# Patient Record
Sex: Male | Born: 1985
Health system: Southern US, Community
[De-identification: ages and names within clinical notes are randomized; demographics above are authoritative.]

## PROBLEM LIST (undated history)

## (undated) DIAGNOSIS — S52023A Displaced fracture of olecranon process without intraarticular extension of unspecified ulna, initial encounter for closed fracture: Secondary | ICD-10-CM

## (undated) DIAGNOSIS — F32A Depression, unspecified: Secondary | ICD-10-CM

## (undated) DIAGNOSIS — E785 Hyperlipidemia, unspecified: Secondary | ICD-10-CM

## (undated) DIAGNOSIS — G4700 Insomnia, unspecified: Secondary | ICD-10-CM

## (undated) DIAGNOSIS — I10 Essential (primary) hypertension: Secondary | ICD-10-CM

## (undated) DIAGNOSIS — F419 Anxiety disorder, unspecified: Secondary | ICD-10-CM

## (undated) DIAGNOSIS — N200 Calculus of kidney: Secondary | ICD-10-CM

## (undated) HISTORY — DX: Essential (primary) hypertension: I10

## (undated) HISTORY — PX: TONSILLECTOMY: SUR1361

## (undated) HISTORY — DX: Hyperlipidemia, unspecified: E78.5

## (undated) HISTORY — DX: Anxiety disorder, unspecified: F41.9

## (undated) HISTORY — PX: VASECTOMY: SHX75

## (undated) HISTORY — PX: FRACTURE SURGERY: SHX138

## (undated) HISTORY — DX: Depression, unspecified: F32.A

## (undated) HISTORY — DX: Insomnia, unspecified: G47.00

## (undated) HISTORY — DX: Calculus of kidney: N20.0

---
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
## 2006-09-29 ENCOUNTER — Emergency Department (HOSPITAL_COMMUNITY): Admission: EM | Admit: 2006-09-29 | Discharge: 2006-09-29 | Payer: Self-pay | Admitting: Emergency Medicine

## 2012-10-10 ENCOUNTER — Telehealth: Payer: Self-pay | Admitting: Physician Assistant

## 2012-10-11 MED ORDER — AMLODIPINE BESYLATE 5 MG PO TABS: 5.0000 mg | ORAL_TABLET | Freq: Every day | ORAL | Status: DC

## 2012-10-11 NOTE — Telephone Encounter (Signed)
done

## 2012-10-17 ENCOUNTER — Ambulatory Visit: Payer: Self-pay | Admitting: Physician Assistant

## 2012-10-17 ENCOUNTER — Encounter: Payer: Self-pay | Admitting: Physician Assistant

## 2012-10-17 VITALS — BP 146/79 | HR 68 | Temp 97.4°F | Ht 69.0 in | Wt 179.0 lb

## 2012-10-17 DIAGNOSIS — I10 Essential (primary) hypertension: Secondary | ICD-10-CM

## 2012-10-17 MED ORDER — AMLODIPINE BESYLATE 5 MG PO TABS: 5.0000 mg | ORAL_TABLET | Freq: Every day | ORAL | Status: DC

## 2012-10-17 MED ORDER — HYDROCHLOROTHIAZIDE 12.5 MG PO CAPS: 12.5000 mg | ORAL_CAPSULE | Freq: Every day | ORAL | Status: DC

## 2012-10-17 NOTE — Patient Instructions (Signed)

## 2012-10-17 NOTE — Progress Notes (Signed)
Subjective:     Patient ID: Jacob Benson, male   DOB: 18-Mar-1986, 27 y.o.   MRN: [id number]  HPI Pt here for review of his HTN He has a strong FH of HTN at young age All of his labs/EKG have returned nl He denies any CP/SOB He remains very active  Review of Systems  All other systems reviewed and are negative.       Objective:   Physical Exam VSS and nursing note reviewed No bruits Heart- RRR w/o M Lungs- CTA Pulses equal in upper ext No lower ext edema Discussed possible renal artery [place] but pt defers due to lack of ins    Assessment:     1. HTN (hypertension)        Plan:     Cont Norvasc Add HCTZ 12.5mg  daily Labs are up to date Meds x 6mos Pt will inform us when he has ins so we can set up renal artery [place]

## 2013-04-24 ENCOUNTER — Other Ambulatory Visit: Payer: Self-pay | Admitting: Physician Assistant

## 2013-04-26 ENCOUNTER — Telehealth: Payer: Self-pay | Admitting: Nurse Practitioner

## 2013-04-27 MED ORDER — HYDROCHLOROTHIAZIDE 12.5 MG PO CAPS: 12.5000 mg | ORAL_CAPSULE | Freq: Every day | ORAL | Status: DC

## 2013-04-27 NOTE — Telephone Encounter (Signed)
done

## 2013-04-27 NOTE — Telephone Encounter (Signed)
Last seen 10/17/12  [facility]

## 2013-05-21 ENCOUNTER — Encounter: Payer: Self-pay | Admitting: Family Medicine

## 2013-05-21 ENCOUNTER — Ambulatory Visit (INDEPENDENT_AMBULATORY_CARE_PROVIDER_SITE_OTHER): Payer: 59 | Admitting: Family Medicine

## 2013-05-21 VITALS — BP 127/74 | HR 69 | Temp 97.0°F | Ht 69.0 in | Wt 182.4 lb

## 2013-05-21 DIAGNOSIS — I10 Essential (primary) hypertension: Secondary | ICD-10-CM

## 2013-05-21 DIAGNOSIS — J069 Acute upper respiratory infection, unspecified: Secondary | ICD-10-CM

## 2013-05-21 MED ORDER — HYDROCHLOROTHIAZIDE 12.5 MG PO CAPS: 12.5000 mg | ORAL_CAPSULE | Freq: Every day | ORAL | Status: DC

## 2013-05-21 MED ORDER — AZITHROMYCIN 250 MG PO TABS: ORAL_TABLET | ORAL | Status: DC

## 2013-05-21 MED ORDER — AMLODIPINE BESYLATE 5 MG PO TABS: 5.0000 mg | ORAL_TABLET | Freq: Every day | ORAL | Status: DC

## 2013-05-21 NOTE — Progress Notes (Signed)
   Subjective:    Patient ID: Vladimir FasterBrandon M Mayden, male    DOB: [birth year], 28 y.o.   MRN: [id number]  HPI This 28 y.o. male presents for evaluation of hypertension.  He needs refills.  He has URI sx's for over a week.   Review of Systems C/o uri sx's No chest pain, SOB, HA, dizziness, vision change, N/V, diarrhea, constipation, dysuria, urinary urgency or frequency, myalgias, arthralgias or rash.     Objective:   Physical Exam  Vital signs noted  Well developed well nourished male.  HEENT - Head atraumatic Normocephalic                Eyes - PERRLA, Conjuctiva - clear Sclera- Clear EOMI                Ears - EAC's Wnl TM's Wnl Gross Hearing WNL                Nose - Nares patent                 Throat - oropharanx wnl Respiratory - Lungs CTA bilateral Cardiac - RRR S1 and S2 without murmur GI - Abdomen soft Nontender and bowel sounds active x 4 Extremities - No edema. Neuro - Grossly intact.      Assessment & Plan:  HTN (hypertension) - Plan: hydrochlorothiazide (MICROZIDE) 12.5 MG capsule, amLODipine (NORVASC) 5 MG tablet  URI (upper respiratory infection) - Plan: azithromycin (ZITHROMAX) 250 MG tablet Push po fluids, rest, tylenol and motrin otc prn as directed for fever, arthralgias, and myalgias.  Follow up prn if sx's continue or persist.  Deatra CanterWilliam J Yong Grieser FNP

## 2013-06-07 ENCOUNTER — Encounter: Payer: Self-pay | Admitting: Nurse Practitioner

## 2013-06-07 ENCOUNTER — Ambulatory Visit (INDEPENDENT_AMBULATORY_CARE_PROVIDER_SITE_OTHER): Payer: 59 | Admitting: Nurse Practitioner

## 2013-06-07 VITALS — BP 137/81 | HR 90 | Temp 98.1°F | Ht 70.0 in | Wt 178.0 lb

## 2013-06-07 DIAGNOSIS — J029 Acute pharyngitis, unspecified: Secondary | ICD-10-CM

## 2013-06-07 DIAGNOSIS — Z20828 Contact with and (suspected) exposure to other viral communicable diseases: Secondary | ICD-10-CM

## 2013-06-07 LAB — POCT INFLUENZA A/B
INFLUENZA A, POC: NEGATIVE
INFLUENZA B, POC: NEGATIVE

## 2013-06-07 LAB — POCT RAPID STREP A (OFFICE): RAPID STREP A SCREEN: NEGATIVE

## 2013-06-07 MED ORDER — OSELTAMIVIR PHOSPHATE 75 MG PO CAPS: 75.0000 mg | ORAL_CAPSULE | Freq: Every day | ORAL | Status: DC

## 2013-06-07 NOTE — Patient Instructions (Signed)

## 2013-06-07 NOTE — Progress Notes (Signed)
   Subjective:    Patient ID: Jacob Benson, male    DOB: 1985-07-15, 28 y.o.   MRN: [id number]  HPI Patient in today c/o sorethroat- started 2 days ago. Worse today.    Review of Systems  Constitutional: Positive for fatigue. Negative for fever, chills and appetite change.  HENT: Positive for congestion, postnasal drip, rhinorrhea, sinus pressure and sore throat. Negative for ear pain, trouble swallowing and voice change.   Respiratory: Positive for cough.   Cardiovascular: Negative.   Gastrointestinal: Negative.   All other systems reviewed and are negative.       Objective:   Physical Exam  Constitutional: He is oriented to person, place, and time. He appears well-developed and well-nourished. No distress.  HENT:  Right Ear: Hearing, tympanic membrane, external ear and ear canal normal.  Left Ear: Hearing, tympanic membrane, external ear and ear canal normal.  Nose: Mucosal edema and rhinorrhea present. Right sinus exhibits no maxillary sinus tenderness and no frontal sinus tenderness. Left sinus exhibits no maxillary sinus tenderness and no frontal sinus tenderness.  Mouth/Throat: Uvula is midline and oropharynx is clear and moist.  Cardiovascular: Normal rate, regular rhythm and normal heart sounds.   Pulmonary/Chest: Effort normal and breath sounds normal. No respiratory distress. He has no wheezes. He has no rales.  Dry cough   Neurological: He is alert and oriented to person, place, and time.  Skin: Skin is warm.  Psychiatric: He has a normal mood and affect. His behavior is normal. Judgment and thought content normal.    BP 137/81  Pulse 90  Temp(Src) 98.1 F (36.7 C) (Oral)  Ht 5\' 10"  (1.778 m)  Wt 178 lb (80.74 kg)  BMI 25.54 kg/m2 Results for orders placed in visit on 06/07/13  POCT RAPID STREP A (OFFICE)      Result Value Range   Rapid Strep A Screen Negative  Negative  POCT INFLUENZA A/B      Result Value Range   Influenza A, POC Negative     Influenza B, POC Negative           Assessment & Plan:   1. Sore throat   2. Exposure to the flu    Meds ordered this encounter  Medications  . oseltamivir (TAMIFLU) 75 MG capsule    Sig: Take 1 capsule (75 mg total) by mouth daily.    Dispense:  10 capsule    Refill:  0    Order Specific Question:  Supervising Provider    Answer:  Ernestina PennaMOORE, DONALD W [[id number]]   1. Take meds as prescribed 2. Use a cool mist humidifier especially during the winter months and when heat has been humid. 3. Use saline nose sprays frequently 4. Saline irrigations of the nose can be very helpful if done frequently.  * 4X daily for 1 week*  * Use of a nettie pot can be helpful with this. Follow directions with this* 5. Drink plenty of fluids 6. Keep thermostat turn down low 7.For any cough or congestion  Use plain Mucinex- regular strength or max strength is fine   * Children- consult with Pharmacist for dosing 8. For fever or aces or pains- take tylenol or ibuprofen appropriate for age and weight.  * for fevers greater than 101 orally you may alternate ibuprofen and tylenol every  3 hours.   Mary-Margaret Daphine DeutscherMartin, FNP

## 2013-12-10 ENCOUNTER — Encounter: Payer: Self-pay | Admitting: Family Medicine

## 2013-12-10 ENCOUNTER — Ambulatory Visit (INDEPENDENT_AMBULATORY_CARE_PROVIDER_SITE_OTHER): Payer: 59

## 2013-12-10 ENCOUNTER — Telehealth: Payer: Self-pay | Admitting: Family Medicine

## 2013-12-10 ENCOUNTER — Encounter (INDEPENDENT_AMBULATORY_CARE_PROVIDER_SITE_OTHER): Payer: Self-pay

## 2013-12-10 ENCOUNTER — Ambulatory Visit (INDEPENDENT_AMBULATORY_CARE_PROVIDER_SITE_OTHER): Payer: 59 | Admitting: Family Medicine

## 2013-12-10 VITALS — BP 125/79 | HR 70 | Temp 98.2°F | Ht 70.0 in | Wt 180.2 lb

## 2013-12-10 DIAGNOSIS — M79601 Pain in right arm: Secondary | ICD-10-CM

## 2013-12-10 DIAGNOSIS — M79609 Pain in unspecified limb: Secondary | ICD-10-CM

## 2013-12-10 MED ORDER — IBUPROFEN 600 MG PO TABS: 600.0000 mg | ORAL_TABLET | Freq: Three times a day (TID) | ORAL | Status: DC | PRN

## 2013-12-10 NOTE — Progress Notes (Signed)
   Subjective:    Patient ID: Vladimir FasterBrandon M Antuna, male    DOB: [birth year], 28 y.o.   MRN: [id number]  HPI This 28 y.o. male presents for evaluation of right arm pain.  He had heavy object fall on his right arm and it hurts to lift.  He has full ROM of right hand.   Review of Systems No chest pain, SOB, HA, dizziness, vision change, N/V, diarrhea, constipation, dysuria, urinary urgency or frequency, myalgias, arthralgias or rash.     Objective:   Physical Exam  Vital signs noted  Well developed well nourished male.  HEENT - Head atraumatic Normocephalic                Eyes - PERRLA, Conjuctiva - clear Sclera- Clear EOMI                Ears - EAC's Wnl TM's Wnl Gross Hearing WNL                Throat - oropharanx wnl Respiratory - Lungs CTA bilateral Cardiac - RRR S1 and S2 without murmur. MS - TTP right forearm.  No deformity.  Xray of right forearm - No fracture Prelimnary reading by Angeline SlimWilliam Chevella Pearce,FNP      Assessment & Plan:  Right arm pain - Plan: DG Forearm Right, ibuprofen (ADVIL,MOTRIN) 600 MG tablet po tid x 10 days ICE to sight and recommend tylenol for pain  Deatra CanterWilliam J Marka Treloar FNP

## 2013-12-10 NOTE — Telephone Encounter (Signed)
Appt given for today 

## 2014-01-28 ENCOUNTER — Telehealth: Payer: Self-pay | Admitting: Family Medicine

## 2014-01-29 MED ORDER — HYDROCHLOROTHIAZIDE 12.5 MG PO CAPS: 12.5000 mg | ORAL_CAPSULE | Freq: Every day | ORAL | Status: DC

## 2014-01-29 MED ORDER — AMLODIPINE BESYLATE 5 MG PO TABS: 5.0000 mg | ORAL_TABLET | Freq: Every day | ORAL | Status: DC

## 2014-01-29 NOTE — Telephone Encounter (Signed)
done

## 2014-03-18 ENCOUNTER — Ambulatory Visit (INDEPENDENT_AMBULATORY_CARE_PROVIDER_SITE_OTHER): Payer: 59 | Admitting: Family Medicine

## 2014-03-18 ENCOUNTER — Encounter: Payer: Self-pay | Admitting: Family Medicine

## 2014-03-18 VITALS — BP 122/80 | HR 66 | Temp 99.3°F | Ht 70.0 in | Wt 183.0 lb

## 2014-03-18 DIAGNOSIS — Z23 Encounter for immunization: Secondary | ICD-10-CM

## 2014-03-18 DIAGNOSIS — F411 Generalized anxiety disorder: Secondary | ICD-10-CM

## 2014-03-18 DIAGNOSIS — K589 Irritable bowel syndrome without diarrhea: Secondary | ICD-10-CM

## 2014-03-18 DIAGNOSIS — I10 Essential (primary) hypertension: Secondary | ICD-10-CM

## 2014-03-18 MED ORDER — HYDROCHLOROTHIAZIDE 12.5 MG PO CAPS: 12.5000 mg | ORAL_CAPSULE | Freq: Every day | ORAL | Status: DC

## 2014-03-18 MED ORDER — AMLODIPINE BESYLATE 5 MG PO TABS: 5.0000 mg | ORAL_TABLET | Freq: Every day | ORAL | Status: DC

## 2014-03-18 NOTE — Patient Instructions (Signed)
Use Citrucel low sugar over-the-counter for constipation/diarrhea Continue to drink plenty of fluids Avoid caffeine milk cheese ice cream and dairy products Return to clinic as directed for fasting lab work and urinalysis Continue to take current medication Take Zantac or ranitidine 150 over-the-counter one twice daily before breakfast and supper Avoid NSAIDs like ibuprofen and Aleve as much as possible

## 2014-03-18 NOTE — Progress Notes (Signed)
Subjective:    Patient ID: Jacob Benson, male    DOB: [birth year], 28 y.o.   MRN: [id number]  HPI Patient here today for follow up on HTN. He has recently changed insurances and therefore pharmacies.the patient indicates that he has had trouble with his stomach for years. He has alternating diarrhea and constipation. He says he does not drink a lot of caffeine or milk cheese ice cream and dairy products. He currently appears very anxious and is concerned about his wife who is due in February with her first child.     Patient Active Problem List   Diagnosis Date Noted  . HTN (hypertension) 10/17/2012   Outpatient Encounter Prescriptions as of 03/18/2014  Medication Sig  . amLODipine (NORVASC) 5 MG tablet Take 1 tablet (5 mg total) by mouth daily.  . hydrochlorothiazide (MICROZIDE) 12.5 MG capsule Take 1 capsule (12.5 mg total) by mouth daily.  Marland Kitchen. ibuprofen (ADVIL,MOTRIN) 600 MG tablet Take 1 tablet (600 mg total) by mouth every 8 (eight) hours as needed.  . [DISCONTINUED] amLODipine (NORVASC) 5 MG tablet Take 1 tablet (5 mg total) by mouth daily.  . [DISCONTINUED] hydrochlorothiazide (MICROZIDE) 12.5 MG capsule Take 1 capsule (12.5 mg total) by mouth daily.     Review of Systems  Constitutional: Negative.   HENT: Negative.   Eyes: Negative.   Respiratory: Negative.   Cardiovascular: Negative.   Gastrointestinal: Positive for constipation.  Endocrine: Negative.   Genitourinary: Negative.   Musculoskeletal: Negative.   Skin: Negative.   Allergic/Immunologic: Negative.   Neurological: Negative.   Hematological: Negative.   Psychiatric/Behavioral: Negative.        Objective:   Physical Exam  Constitutional: He is oriented to person, place, and time. He appears well-developed and well-nourished. No distress.  HENT:  Head: Normocephalic and atraumatic.  Right Ear: External ear normal.  Left Ear: External ear normal.  Nose: Nose normal.  Mouth/Throat: Oropharynx is clear  and moist. No oropharyngeal exudate.  Eyes: Conjunctivae and EOM are normal. Pupils are equal, round, and reactive to light. Right eye exhibits no discharge. Left eye exhibits no discharge. No scleral icterus.  Neck: Normal range of motion. Neck supple. No thyromegaly present.  Cardiovascular: Normal rate, regular rhythm, normal heart sounds and intact distal pulses.  Exam reveals no gallop and no friction rub.   No murmur heard. At 72/m  Pulmonary/Chest: Effort normal and breath sounds normal. No respiratory distress. He has no wheezes. He has no rales. He exhibits no tenderness.  There are no axillary nodes.  Abdominal: Soft. Bowel sounds are normal. He exhibits no mass. There is no tenderness. There is no rebound and no guarding.  Bowel sounds are normal without abdominal tenderness or inguinal nodes.  Musculoskeletal: Normal range of motion. He exhibits no edema or tenderness.  Lymphadenopathy:    He has no cervical adenopathy.  Neurological: He is alert and oriented to person, place, and time. He has normal reflexes. No cranial nerve deficit.  Skin: Skin is warm and dry. No rash noted. No erythema. No pallor.  Psychiatric: He has a normal mood and affect. His behavior is normal. Judgment and thought content normal.  Nursing note and vitals reviewed.  BP 131/77 mmHg  Pulse 66  Temp(Src) 99.3 F (37.4 C) (Oral)  Ht 5\' 10"  (1.778 m)  Wt 183 lb (83.008 kg)  BMI 26.26 kg/m2  Home blood pressures are running in the 120s over the upper 70s.      Assessment & Plan:  1. Essential hypertension - amLODipine (NORVASC) 5 MG tablet; Take 1 tablet (5 mg total) by mouth daily.  Dispense: 90 tablet; Refill: 3 - hydrochlorothiazide (MICROZIDE) 12.5 MG capsule; Take 1 capsule (12.5 mg total) by mouth daily.  Dispense: 90 capsule; Refill: 3  2. Generalized anxiety disorder  3. Irritable bowel syndrome  Meds ordered this encounter  Medications  . amLODipine (NORVASC) 5 MG tablet    Sig:  Take 1 tablet (5 mg total) by mouth daily.    Dispense:  90 tablet    Refill:  3  . hydrochlorothiazide (MICROZIDE) 12.5 MG capsule    Sig: Take 1 capsule (12.5 mg total) by mouth daily.    Dispense:  90 capsule    Refill:  3   Patient Instructions  Use Citrucel low sugar over-the-counter for constipation/diarrhea Continue to drink plenty of fluids Avoid caffeine milk cheese ice cream and dairy products Return to clinic as directed for fasting lab work and urinalysis Continue to take current medication Take Zantac or ranitidine 150 over-the-counter one twice daily before breakfast and supper Avoid NSAIDs like ibuprofen and Aleve as much as possible   Nyra Capeson W. Reesa Gotschall MD

## 2014-03-22 ENCOUNTER — Other Ambulatory Visit (INDEPENDENT_AMBULATORY_CARE_PROVIDER_SITE_OTHER): Payer: 59

## 2014-03-22 DIAGNOSIS — Z1212 Encounter for screening for malignant neoplasm of rectum: Secondary | ICD-10-CM

## 2014-03-22 DIAGNOSIS — K589 Irritable bowel syndrome without diarrhea: Secondary | ICD-10-CM

## 2014-03-22 DIAGNOSIS — F411 Generalized anxiety disorder: Secondary | ICD-10-CM

## 2014-03-22 DIAGNOSIS — I10 Essential (primary) hypertension: Secondary | ICD-10-CM

## 2014-03-22 LAB — POCT CBC
GRANULOCYTE PERCENT: 69.4 % (ref 37–80)
HEMATOCRIT: 45.7 % (ref 43.5–53.7)
Hemoglobin: 15.3 g/dL (ref 14.1–18.1)
Lymph, poc: 1.7 (ref 0.6–3.4)
MCH, POC: 29.2 pg (ref 27–31.2)
MCHC: 33.6 g/dL (ref 31.8–35.4)
MCV: 86.9 fL (ref 80–97)
MPV: 7.4 fL (ref 0–99.8)
POC Granulocyte: 4.5 (ref 2–6.9)
POC LYMPH PERCENT: 26.9 %L (ref 10–50)
Platelet Count, POC: 234 10*3/uL (ref 142–424)
RBC: 5.3 M/uL (ref 4.69–6.13)
RDW, POC: 13.7 %
WBC: 6.5 10*3/uL (ref 4.6–10.2)

## 2014-03-22 NOTE — Addendum Note (Signed)
Addended by: Prescott GumLAND, Yicel Shannon M on: 03/22/2014 08:05 AM   Modules accepted: Orders

## 2014-03-22 NOTE — Progress Notes (Signed)
Lab only 

## 2014-03-23 LAB — BMP8+EGFR
BUN/Creatinine Ratio: 14 (ref 8–19)
BUN: 18 mg/dL (ref 6–20)
CO2: 27 mmol/L (ref 18–29)
Calcium: 9.2 mg/dL (ref 8.7–10.2)
Chloride: 98 mmol/L (ref 97–108)
Creatinine, Ser: 1.32 mg/dL — ABNORMAL HIGH (ref 0.76–1.27)
GFR calc Af Amer: 85 mL/min/{1.73_m2} (ref >59)
GFR calc non Af Amer: 73 mL/min/{1.73_m2} (ref >59)
Glucose: 88 mg/dL (ref 65–99)
Potassium: 4 mmol/L (ref 3.5–5.2)
Sodium: 138 mmol/L (ref 134–144)

## 2014-03-23 LAB — NMR, LIPOPROFILE
Cholesterol: 231 mg/dL — ABNORMAL HIGH (ref 100–199)
HDL CHOLESTEROL BY NMR: 43 mg/dL (ref >39)
HDL Particle Number: 29.8 umol/L — ABNORMAL LOW (ref >=30.5)
LDL Particle Number: 1934 nmol/L — ABNORMAL HIGH (ref <1000)
LDL Size: 21.5 nm (ref >20.5)
LDL-C: 170 mg/dL — ABNORMAL HIGH (ref 0–99)
LP-IR Score: 45 (ref <=45)
Small LDL Particle Number: 618 nmol/L — ABNORMAL HIGH (ref <=527)
TRIGLYCERIDES BY NMR: 89 mg/dL (ref 0–149)

## 2014-03-23 LAB — HEPATIC FUNCTION PANEL
ALBUMIN: 4.3 g/dL (ref 3.5–5.5)
ALK PHOS: 66 IU/L (ref 39–117)
ALT: 19 IU/L (ref 0–44)
AST: 18 IU/L (ref 0–40)
Bilirubin, Direct: 0.14 mg/dL (ref 0.00–0.40)
Total Bilirubin: 0.6 mg/dL (ref 0.0–1.2)
Total Protein: 6.7 g/dL (ref 6.0–8.5)

## 2014-03-23 LAB — FECAL OCCULT BLOOD, IMMUNOCHEMICAL: FECAL OCCULT BLD: NEGATIVE

## 2014-03-23 LAB — THYROID PANEL WITH TSH
FREE THYROXINE INDEX: 2.4 (ref 1.2–4.9)
T3 UPTAKE RATIO: 30 % (ref 24–39)
T4 TOTAL: 8.1 ug/dL (ref 4.5–12.0)
TSH: 2.8 u[IU]/mL (ref 0.450–4.500)

## 2014-04-19 ENCOUNTER — Encounter: Payer: Self-pay | Admitting: Pharmacist

## 2014-04-19 ENCOUNTER — Ambulatory Visit (INDEPENDENT_AMBULATORY_CARE_PROVIDER_SITE_OTHER): Payer: 59 | Admitting: Pharmacist

## 2014-04-19 VITALS — BP 124/80 | HR 70 | Ht 70.0 in | Wt 185.0 lb

## 2014-04-19 DIAGNOSIS — R7989 Other specified abnormal findings of blood chemistry: Secondary | ICD-10-CM

## 2014-04-19 DIAGNOSIS — E785 Hyperlipidemia, unspecified: Secondary | ICD-10-CM

## 2014-04-19 DIAGNOSIS — I10 Essential (primary) hypertension: Secondary | ICD-10-CM

## 2014-04-19 DIAGNOSIS — R748 Abnormal levels of other serum enzymes: Secondary | ICD-10-CM

## 2014-04-19 LAB — POCT URINALYSIS DIPSTICK
Bilirubin, UA: NEGATIVE
Blood, UA: NEGATIVE
Glucose, UA: NEGATIVE
Ketones, UA: NEGATIVE
LEUKOCYTES UA: NEGATIVE
NITRITE UA: NEGATIVE
PH UA: 7
Protein, UA: NEGATIVE
Spec Grav, UA: 1.01
Urobilinogen, UA: NEGATIVE

## 2014-04-19 NOTE — Progress Notes (Signed)
Lipid Clinic Consultation  Chief Complaint:   Chief Complaint  Patient presents with  . Hyperlipidemia     HPI:  Jacob Benson is a 28yo male who presents for dietary consult for hyperlipidemia.  This is the patient's first visit with the clinical pharmacist to discuss hyperlipidemia.  Patient has significant family history of heart disease of second degree relatives but not first degree.  He has been taking medication to control HTN for the 1-2 years.  Patient voices concern about his young age and HTN.  In past he saw a provider who was considering an ultrasound to r/o RAS but this was never done.  He recently had an elevated serum creatinine - due to recheck today.  Also has h/o kidney stones in 2007  CHD/CHF Risk Equivalents:  none AHA ASCVD risk assessment - unable to determine due to age < 40.   Current NCEP Goals: LDL Goal < 130 HDL Goal >/= 40 Tg Goal < 150 Non-HDL Goal < 160  Secondary cause of hyperlipidemia present:  none Low fat diet followed?  No - eats out 2 meals per day.  Usually has a big breakfast with sausage or bacon Low carb diet followed?  No -   Exercise?  No - but has physical job.  Filed Vitals:   04/19/14 1550  BP: 124/80  Pulse: 70   Filed Weights   04/19/14 1550  Weight: 185 lb (83.915 kg)  Body mass index is 26.54 kg/(m^2).     Component Value Date/Time   CHOL 231 03/22/2014   LDL - C 170 03/22/2014   LDL-P 1934 03/22/2014   HDL 43 03/22/2014   TRIG 89 03/22/2014   Serum Creatinine was 1.32 (03/18/2014)  Assessment: Hyperlipidemia HTN - controlled Elevated serum creatinine  Recommendations: Discussed limiting high fat containing foods - increase non starchy vegetables. Discussed Mediterranean Diet Start regular exercise - 30 minutes daily 5 days per week Continue current BP medications Discussed elevated serum creatinine with Dr Moore - labs recommended below are pending Depending on results might consider renal U/S to r/o RAS, CT of  abdomen to r/o structural abnormalities or kidney stones and referral to nephrologist.  Orders Placed This Encounter  Procedures  . BMP8+EGFR  . CBC With differential/Platelet  . POCT urinalysis dipstick     Recheck Lipid Panel:  3 to 4 months   Time spent counseling patient:  40 minutes   Referring Provider:  Moore, Donald MD     PharmD:  , , PHARMD , CPP     

## 2014-04-20 LAB — BMP8+EGFR
BUN/Creatinine Ratio: 11 (ref 8–19)
BUN: 16 mg/dL (ref 6–20)
CALCIUM: 10 mg/dL (ref 8.7–10.2)
CHLORIDE: 98 mmol/L (ref 97–108)
CO2: 28 mmol/L (ref 18–29)
Creatinine, Ser: 1.4 mg/dL — ABNORMAL HIGH (ref 0.76–1.27)
GFR calc Af Amer: 79 mL/min/{1.73_m2} (ref >59)
GFR calc non Af Amer: 68 mL/min/{1.73_m2} (ref >59)
Glucose: 69 mg/dL (ref 65–99)
POTASSIUM: 4.2 mmol/L (ref 3.5–5.2)
Sodium: 142 mmol/L (ref 134–144)

## 2014-04-20 LAB — CBC WITH DIFFERENTIAL
Basophils Absolute: 0 10*3/uL (ref 0.0–0.2)
Basos: 0 %
EOS ABS: 0.1 10*3/uL (ref 0.0–0.4)
Eos: 1 %
HCT: 44.9 % (ref 37.5–51.0)
Hemoglobin: 15.6 g/dL (ref 12.6–17.7)
IMMATURE GRANULOCYTES: 0 %
Immature Grans (Abs): 0 10*3/uL (ref 0.0–0.1)
LYMPHS: 23 %
Lymphocytes Absolute: 2.6 10*3/uL (ref 0.7–3.1)
MCH: 29.7 pg (ref 26.6–33.0)
MCHC: 34.7 g/dL (ref 31.5–35.7)
MCV: 85 fL (ref 79–97)
MONOS ABS: 0.7 10*3/uL (ref 0.1–0.9)
Monocytes: 6 %
NEUTROS ABS: 7.8 10*3/uL — AB (ref 1.4–7.0)
Neutrophils Relative %: 70 %
Platelets: 250 10*3/uL (ref 150–379)
RBC: 5.26 x10E6/uL (ref 4.14–5.80)
RDW: 13.9 % (ref 12.3–15.4)
WBC: 11.3 10*3/uL — ABNORMAL HIGH (ref 3.4–10.8)

## 2014-04-22 ENCOUNTER — Telehealth: Payer: Self-pay | Admitting: Family Medicine

## 2014-04-22 ENCOUNTER — Other Ambulatory Visit: Payer: Self-pay

## 2014-04-22 DIAGNOSIS — R7989 Other specified abnormal findings of blood chemistry: Secondary | ICD-10-CM

## 2014-04-22 NOTE — Telephone Encounter (Signed)
Patient has a few questions about labs and CT that was ordered.  Questions answered.

## 2014-04-26 ENCOUNTER — Ambulatory Visit (HOSPITAL_COMMUNITY)
Admission: RE | Admit: 2014-04-26 | Discharge: 2014-04-26 | Disposition: A | Discharge status: Home / Self Care | Payer: 59 | Source: Ambulatory Visit | Attending: Family Medicine | Admitting: Family Medicine

## 2014-04-26 DIAGNOSIS — N281 Cyst of kidney, acquired: Secondary | ICD-10-CM | POA: Insufficient documentation

## 2014-04-26 DIAGNOSIS — R748 Abnormal levels of other serum enzymes: Secondary | ICD-10-CM | POA: Insufficient documentation

## 2014-04-26 DIAGNOSIS — R7989 Other specified abnormal findings of blood chemistry: Secondary | ICD-10-CM

## 2014-04-26 MED ORDER — IOHEXOL 300 MG/ML  SOLN
100.0000 mL | Freq: Once | INTRAMUSCULAR | Status: AC | PRN
  Administered 2014-04-26: 100 mL via INTRAVENOUS

## 2014-04-29 ENCOUNTER — Ambulatory Visit: Payer: 59 | Admitting: Family Medicine

## 2014-05-01 ENCOUNTER — Ambulatory Visit (INDEPENDENT_AMBULATORY_CARE_PROVIDER_SITE_OTHER): Payer: 59 | Admitting: Family Medicine

## 2014-05-01 ENCOUNTER — Encounter: Payer: Self-pay | Admitting: Family Medicine

## 2014-05-01 VITALS — BP 137/80 | HR 70 | Temp 97.3°F | Ht 70.0 in | Wt 184.0 lb

## 2014-05-01 DIAGNOSIS — I10 Essential (primary) hypertension: Secondary | ICD-10-CM

## 2014-05-01 DIAGNOSIS — R748 Abnormal levels of other serum enzymes: Secondary | ICD-10-CM

## 2014-05-01 DIAGNOSIS — R7989 Other specified abnormal findings of blood chemistry: Secondary | ICD-10-CM

## 2014-05-01 NOTE — Progress Notes (Signed)
   Subjective:    Patient ID: Jacob Benson, male    DOB: Jan 20, 1986, 28 y.o.   MRN: [id number]  HPI Patient here today for 6 week follow up on HTN and to go over recent CT scan. This was reviewed with the patient he was given a copy of this report. The patient indicates that he could do better with his sodium intake and we'll try to do better with his calorie intake. He is currently taking HCTZ 12.5 one daily and amlodipine 5 mg 1 daily.         Patient Active Problem List   Diagnosis Date Noted  . HTN (hypertension) 10/17/2012   Outpatient Encounter Prescriptions as of 05/01/2014  Medication Sig  . amLODipine (NORVASC) 5 MG tablet Take 1 tablet (5 mg total) by mouth daily.  . hydrochlorothiazide (MICROZIDE) 12.5 MG capsule Take 1 capsule (12.5 mg total) by mouth daily.    Review of Systems  Constitutional: Negative.   HENT: Negative.   Eyes: Negative.   Respiratory: Negative.   Cardiovascular: Negative.   Gastrointestinal: Negative.   Endocrine: Negative.   Genitourinary: Negative.   Musculoskeletal: Negative.   Skin: Negative.   Allergic/Immunologic: Negative.   Neurological: Negative.   Hematological: Negative.   Psychiatric/Behavioral: Negative.        Objective:   Physical Exam  Constitutional: He is oriented to person, place, and time. He appears well-developed and well-nourished. No distress.  HENT:  Head: Normocephalic and atraumatic.  Left Ear: External ear normal.  Eyes: Conjunctivae and EOM are normal. Pupils are equal, round, and reactive to light. Right eye exhibits no discharge. Left eye exhibits no discharge. No scleral icterus.  Neck: Normal range of motion. Neck supple. No thyromegaly present.  Cardiovascular: Normal rate, regular rhythm and normal heart sounds.   No murmur heard. At 60/m  Pulmonary/Chest: Effort normal and breath sounds normal. No respiratory distress. He has no wheezes. He has no rales. He exhibits no tenderness.  Abdominal:  Soft. Bowel sounds are normal. He exhibits no mass. There is no tenderness. There is no rebound and no guarding.  Musculoskeletal: Normal range of motion. He exhibits no edema.  Lymphadenopathy:    He has no cervical adenopathy.  Neurological: He is alert and oriented to person, place, and time.  Skin: Skin is warm and dry. No rash noted.  Psychiatric: He has a normal mood and affect. His behavior is normal. Judgment and thought content normal.  Nursing note and vitals reviewed.  BP 137/80 mmHg  Pulse 70  Temp(Src) 97.3 F (36.3 C) (Oral)  Ht _0  (1.778 m)  Wt 184 lb (83.462 kg)  BMI 26.40 kg/m2        Assessment & Plan:  1. Elevated serum creatinine - BMP8+EGFR  2. Essential hypertension  Patient Instructions  Continue to drink plenty of water Continue to monitor blood pressures and bring these readings by for review in 4-6 weeks Work on caloric intake by reducing carbs and getting more exercise Continue current medication We will try to get an appointment for you to see the nephrologist to further evaluate why your creatinine is elevated.   Arrie Senate MD

## 2014-05-01 NOTE — Addendum Note (Signed)
Addended by: Magdalene RiverBULLINS, JAMIE H on: 05/01/2014 09:34 AM   Modules accepted: Orders

## 2014-05-01 NOTE — Patient Instructions (Signed)
Continue to drink plenty of water Continue to monitor blood pressures and bring these readings by for review in 4-6 weeks Work on caloric intake by reducing carbs and getting more exercise Continue current medication We will try to get an appointment for you to see the nephrologist to further evaluate why your creatinine is elevated.

## 2014-05-02 ENCOUNTER — Ambulatory Visit: Payer: Self-pay | Admitting: Family Medicine

## 2014-05-02 LAB — BMP8+EGFR
BUN / CREAT RATIO: 12 (ref 8–19)
BUN: 15 mg/dL (ref 6–20)
CALCIUM: 9.6 mg/dL (ref 8.7–10.2)
CHLORIDE: 98 mmol/L (ref 97–108)
CO2: 26 mmol/L (ref 18–29)
CREATININE: 1.25 mg/dL (ref 0.76–1.27)
GFR calc Af Amer: 91 mL/min/{1.73_m2} (ref >59)
GFR calc non Af Amer: 78 mL/min/{1.73_m2} (ref >59)
Glucose: 86 mg/dL (ref 65–99)
Potassium: 4.1 mmol/L (ref 3.5–5.2)
SODIUM: 140 mmol/L (ref 134–144)

## 2014-06-06 ENCOUNTER — Encounter: Payer: Self-pay | Admitting: *Deleted

## 2014-06-28 ENCOUNTER — Telehealth: Payer: Self-pay | Admitting: Family Medicine

## 2014-06-28 DIAGNOSIS — I10 Essential (primary) hypertension: Secondary | ICD-10-CM

## 2014-06-28 MED ORDER — AMLODIPINE BESYLATE 5 MG PO TABS: 5.0000 mg | ORAL_TABLET | Freq: Every day | ORAL | Status: DC

## 2014-06-28 MED ORDER — HYDROCHLOROTHIAZIDE 12.5 MG PO CAPS: 12.5000 mg | ORAL_CAPSULE | Freq: Every day | ORAL | Status: DC

## 2014-06-28 NOTE — Telephone Encounter (Signed)
done

## 2014-08-06 ENCOUNTER — Encounter: Payer: Self-pay | Admitting: Family Medicine

## 2014-08-06 ENCOUNTER — Ambulatory Visit (INDEPENDENT_AMBULATORY_CARE_PROVIDER_SITE_OTHER): Payer: 59 | Admitting: Family Medicine

## 2014-08-06 VITALS — BP 129/71 | HR 76 | Temp 98.0°F | Ht 70.0 in | Wt 184.0 lb

## 2014-08-06 DIAGNOSIS — I10 Essential (primary) hypertension: Secondary | ICD-10-CM | POA: Diagnosis not present

## 2014-08-06 DIAGNOSIS — E785 Hyperlipidemia, unspecified: Secondary | ICD-10-CM

## 2014-08-06 DIAGNOSIS — J301 Allergic rhinitis due to pollen: Secondary | ICD-10-CM

## 2014-08-06 DIAGNOSIS — J019 Acute sinusitis, unspecified: Secondary | ICD-10-CM

## 2014-08-06 MED ORDER — AMOXICILLIN 500 MG PO CAPS: 500.0000 mg | ORAL_CAPSULE | Freq: Three times a day (TID) | ORAL | Status: DC

## 2014-08-06 MED ORDER — FLUTICASONE PROPIONATE 50 MCG/ACT NA SUSP: 2.0000 | Freq: Every day | NASAL | Status: DC

## 2014-08-06 NOTE — Progress Notes (Signed)
Subjective:    Patient ID: Jacob Benson, male    DOB: [birth year], 29 y.o.   MRN: [id number]  HPI Pt here for follow up and management of chronic medical problems which includes hypertension and hyperlipidemia. He is taking medications regularly. The patient is also complaining of ear pain today. He also has had nasal congestion. All of his recent lab work was reviewed with him today and he understands the progression of this. Back in November his cholesterol was elevated significantly with advanced lipid testing and he had a couple of elevations of his creatinine which eventually became normal. He says he is feeling well other than the ear pain he has not had a cold or cough recently.         Patient Active Problem List   Diagnosis Date Noted  . HTN (hypertension) 10/17/2012   Outpatient Encounter Prescriptions as of 08/06/2014  Medication Sig  . amLODipine (NORVASC) 5 MG tablet Take 1 tablet (5 mg total) by mouth daily.  . hydrochlorothiazide (MICROZIDE) 12.5 MG capsule Take 1 capsule (12.5 mg total) by mouth daily.    Review of Systems  Constitutional: Negative.   HENT: Positive for ear pain (bilateral ear pain).   Eyes: Negative.   Respiratory: Negative.   Cardiovascular: Negative.   Gastrointestinal: Negative.   Endocrine: Negative.   Genitourinary: Negative.   Musculoskeletal: Negative.   Skin: Negative.   Allergic/Immunologic: Negative.   Neurological: Negative.   Hematological: Negative.   Psychiatric/Behavioral: Negative.        Objective:   Physical Exam  Constitutional: He is oriented to person, place, and time. He appears well-developed and well-nourished. No distress.  HENT:  Head: Normocephalic and atraumatic.  Right Ear: External ear normal.  Left Ear: External ear normal.  Mouth/Throat: No oropharyngeal exudate.  The throat is red posteriorly and there is nasal turbinate congestion bilaterally right greater than left. Both TMs and ear canals were  clear. There is ethmoid sinus tenderness  Eyes: Conjunctivae and EOM are normal. Pupils are equal, round, and reactive to light. Right eye exhibits no discharge. Left eye exhibits no discharge. No scleral icterus.  Neck: Normal range of motion. Neck supple. No thyromegaly present.  Cardiovascular: Normal rate, regular rhythm and normal heart sounds.   No murmur heard. Pulmonary/Chest: Effort normal and breath sounds normal. No respiratory distress. He has no wheezes. He has no rales. He exhibits no tenderness.  Musculoskeletal: Normal range of motion.  Lymphadenopathy:    He has no cervical adenopathy.  Neurological: He is alert and oriented to person, place, and time.  Skin: Skin is warm and dry. No rash noted.  Psychiatric: He has a normal mood and affect. His behavior is normal. Judgment and thought content normal.  Nursing note and vitals reviewed.  BP 129/71 mmHg  Pulse 76  Temp(Src) 98 F (36.7 C) (Oral)  Ht _0  (1.778 m)  Wt 184 lb (83.462 kg)  BMI 26.40 kg/m2        Assessment & Plan:  1. Essential hypertension -The blood pressure is well controlled and the patient should continue with his current treatment and continue to watch sodium intake and keep his weight down. He should also drink plenty of water. - POCT CBC; Future - BMP8+EGFR; Future - Hepatic function panel; Future - NMR, lipoprofile; Future  2. Hyperlipidemia -The patient's previous cholesterol was significantly elevated. We will check another cholesterol and if it remains that elevated we may end up starting him on some medication  for this. The risk factors for heart disease were discussed with him today during the visit - POCT CBC; Future - BMP8+EGFR; Future - Hepatic function panel; Future - NMR, lipoprofile; Future  3. Allergic rhinitis due to pollen -This is been worse over the past couple of weeks. He is already taking Claritin.  4. Acute rhinosinusitis -Because of the ethmoid sinus tenderness  we will start him on an antibiotic.  Meds ordered this encounter  Medications  . amoxicillin (AMOXIL) 500 MG capsule    Sig: Take 1 capsule (500 mg total) by mouth 3 (three) times daily.    Dispense:  30 capsule    Refill:  0  . fluticasone (FLONASE) 50 MCG/ACT nasal spray    Sig: Place 2 sprays into both nostrils daily.    Dispense:  16 g    Refill:  6   Patient Instructions  Continue current medications. Continue good therapeutic lifestyle changes which include good diet and exercise. Fall precautions discussed with patient. If an FOBT was given today- please return it to our front desk.  Flu Shots are still available at our office. If you still haven't had one please call to set up a nurse visit to get one.   After your visit with [place] today you will receive a survey in the mail or online from [employer] regarding your care with [place]. Please take a moment to fill this out. Your feedback is very important to [place] as you can help [place] better understand your patient needs as well as improve your experience and satisfaction. WE CARE ABOUT YOU!!!   The patient should continue to take his blood pressure medicine as he is doing He should come to the office about every 6 months and get his cholesterol checked along with his kidney function. He should not restart smoking He should watch his sodium intake He is to come in on April 6 and get lab work done and we will see him back in 6 months--- we will call him with these results when they become available    Arrie Senate MD

## 2014-08-06 NOTE — Patient Instructions (Addendum)
Continue current medications. Continue good therapeutic lifestyle changes which include good diet and exercise. Fall precautions discussed with patient. If an FOBT was given today- please return it to our front desk.  Flu Shots are still available at our office. If you still haven't had one please call to set up a nurse visit to get one.   After your visit with us today you will receive a survey in the mail or online from [employer] regarding your care with us. Please take a moment to fill this out. Your feedback is very important to us as you can help us better understand your patient needs as well as improve your experience and satisfaction. WE CARE ABOUT YOU!!!   The patient should continue to take his blood pressure medicine as he is doing He should come to the office about every 6 months and get his cholesterol checked along with his kidney function. He should not restart smoking He should watch his sodium intake He is to come in on April 6 and get lab work done and we will see him back in 6 months--- we will call him with these results when they become available

## 2014-08-09 ENCOUNTER — Ambulatory Visit: Payer: 59 | Admitting: Family Medicine

## 2014-08-16 ENCOUNTER — Encounter: Payer: Self-pay | Admitting: Nurse Practitioner

## 2014-08-16 ENCOUNTER — Ambulatory Visit (INDEPENDENT_AMBULATORY_CARE_PROVIDER_SITE_OTHER): Payer: 59 | Admitting: Nurse Practitioner

## 2014-08-16 VITALS — BP 137/80 | HR 72 | Temp 97.8°F | Ht 70.0 in | Wt 185.0 lb

## 2014-08-16 DIAGNOSIS — J029 Acute pharyngitis, unspecified: Secondary | ICD-10-CM

## 2014-08-16 LAB — POCT RAPID STREP A (OFFICE): Rapid Strep A Screen: NEGATIVE

## 2014-08-16 MED ORDER — AMOXICILLIN 875 MG PO TABS: 875.0000 mg | ORAL_TABLET | Freq: Two times a day (BID) | ORAL | Status: DC

## 2014-08-16 NOTE — Patient Instructions (Signed)
Force fluids °Motrin or tylenol OTC °OTC decongestant °Throat lozenges if help °New toothbrush in 3 days ° °

## 2014-08-16 NOTE — Progress Notes (Signed)
  Subjective:     Jacob Benson is a 29 y.o. male who presents for evaluation of sore throat. Associated symptoms include fevers up to 100 degrees, chills, nasal blockage, post nasal drip, sinus and nasal congestion and sore throat. Onset of symptoms was 2 days ago, and have been gradually worsening since that time. He is drinking moderate amounts of fluids. He has not had a recent close exposure to someone with proven streptococcal pharyngitis.  The following portions of the patient's history were reviewed and updated as appropriate: allergies, current medications, past family history, past medical history, past social history, past surgical history and problem list.  Review of Systems Pertinent items are noted in HPI.    Objective:    BP 137/80 mmHg  Pulse 72  Temp(Src) 97.8 F (36.6 C) (Oral)  Ht 5\' 10"  (1.778 m)  Wt 185 lb (83.915 kg)  BMI 26.54 kg/m2 General appearance: alert and cooperative Eyes: conjunctivae/corneas clear. PERRL, EOM's intact. Fundi benign. Ears: normal TM's and external ear canals both ears Nose: Nares normal. Septum midline. Mucosa normal. No drainage or sinus tenderness. Throat: abnormal findings: mild oropharyngeal erythema Neck: no adenopathy, no carotid bruit, no JVD, supple, symmetrical, trachea midline and thyroid not enlarged, symmetric, no tenderness/mass/nodules Lungs: clear to auscultation bilaterally Heart: regular rate and rhythm, S1, S2 normal, no murmur, click, rub or gallop  Laboratory Strep test done.      Assessment:    Acute pharyngitis, likely  pharyngitis.    Plan:   Meds ordered this encounter  Medications  . amoxicillin (AMOXIL) 875 MG tablet    Sig: Take 1 tablet (875 mg total) by mouth 2 (two) times daily. 1 po BID    Dispense:  20 tablet    Refill:  0    Order Specific Question:  Supervising Provider    Answer:  Deborra MedinaMOORE, DONALD W [[id number]]   Force fluids Motrin or tylenol OTC OTC decongestant Throat lozenges if  help New toothbrush in 3 days  Mary-Margaret Daphine DeutscherMartin, FNP

## 2014-08-20 ENCOUNTER — Telehealth: Payer: Self-pay | Admitting: Family Medicine

## 2014-08-20 NOTE — Telephone Encounter (Signed)
If amoxicillin is not working then it is viral and willl have to run its course. Just treat symptoms with throat lozgenses

## 2014-08-20 NOTE — Telephone Encounter (Signed)
Patient was seen Friday and he is still complaining with a real bad sore throat and cough and is no better. He wants a different antibiotic called in

## 2014-08-21 NOTE — Telephone Encounter (Signed)
Tc back to pt w/ Primus BravoMary Martin's recommendations

## 2014-10-15 ENCOUNTER — Encounter: Payer: Self-pay | Admitting: *Deleted

## 2015-02-13 ENCOUNTER — Ambulatory Visit: Payer: 59 | Admitting: Family Medicine

## 2015-02-17 ENCOUNTER — Other Ambulatory Visit: Payer: Self-pay | Admitting: Family Medicine

## 2015-02-17 ENCOUNTER — Ambulatory Visit (INDEPENDENT_AMBULATORY_CARE_PROVIDER_SITE_OTHER): Payer: 59

## 2015-02-17 ENCOUNTER — Ambulatory Visit (INDEPENDENT_AMBULATORY_CARE_PROVIDER_SITE_OTHER): Payer: 59 | Admitting: Family Medicine

## 2015-02-17 ENCOUNTER — Encounter: Payer: Self-pay | Admitting: Family Medicine

## 2015-02-17 VITALS — BP 135/81 | HR 61 | Temp 97.3°F | Ht 70.0 in | Wt 180.0 lb

## 2015-02-17 DIAGNOSIS — E785 Hyperlipidemia, unspecified: Secondary | ICD-10-CM

## 2015-02-17 DIAGNOSIS — Z0000 Encounter for general adult medical examination without abnormal findings: Secondary | ICD-10-CM

## 2015-02-17 DIAGNOSIS — Z23 Encounter for immunization: Secondary | ICD-10-CM | POA: Diagnosis not present

## 2015-02-17 DIAGNOSIS — I10 Essential (primary) hypertension: Secondary | ICD-10-CM

## 2015-02-17 DIAGNOSIS — J301 Allergic rhinitis due to pollen: Secondary | ICD-10-CM

## 2015-02-17 LAB — POCT UA - MICROSCOPIC ONLY
Bacteria, U Microscopic: NEGATIVE
CASTS, UR, LPF, POC: NEGATIVE
CRYSTALS, UR, HPF, POC: NEGATIVE
Epithelial cells, urine per micros: NEGATIVE
MUCUS UA: NEGATIVE
RBC, urine, microscopic: NEGATIVE
WBC, Ur, HPF, POC: NEGATIVE
Yeast, UA: NEGATIVE

## 2015-02-17 LAB — POCT URINALYSIS DIPSTICK
BILIRUBIN UA: NEGATIVE
Glucose, UA: NEGATIVE
Ketones, UA: NEGATIVE
Leukocytes, UA: NEGATIVE
Nitrite, UA: NEGATIVE
Protein, UA: NEGATIVE
RBC UA: NEGATIVE
UROBILINOGEN UA: NEGATIVE
pH, UA: 6

## 2015-02-17 MED ORDER — HYDROCHLOROTHIAZIDE 12.5 MG PO CAPS: 12.5000 mg | ORAL_CAPSULE | Freq: Every day | ORAL | Status: DC

## 2015-02-17 MED ORDER — AMLODIPINE BESYLATE 5 MG PO TABS: 5.0000 mg | ORAL_TABLET | Freq: Every day | ORAL | Status: DC

## 2015-02-17 NOTE — Progress Notes (Signed)
Subjective:    Patient ID: Jacob Benson, male    DOB: 03/17/1986, 29 y.o.   MRN: [id number]  HPI Patient is here today for annual wellness exam and follow up of chronic medical problems which includes hypertension and hyperlipidemia. He is taking medications regularly. The patient comes in for his regular physical exam today. He has a family history of hypertension and grandparents that died of heart disease. He complains today only of nasal and chest congestion. He is unable to use Flonase. He has a rebound from using Afrin. He denies chest pain, shortness of breath trouble swallowing but does have some heartburn and this is relieved with taking a Zantac once daily. He does not smoke. His bowels are normal with no blood in the stool he is passing his water without problems.      Patient Active Problem List   Diagnosis Date Noted  . HTN (hypertension) 10/17/2012   Outpatient Encounter Prescriptions as of 02/17/2015  Medication Sig  . amLODipine (NORVASC) 5 MG tablet Take 1 tablet (5 mg total) by mouth daily.  . fluticasone (FLONASE) 50 MCG/ACT nasal spray Place 2 sprays into both nostrils daily.  . hydrochlorothiazide (MICROZIDE) 12.5 MG capsule Take 1 capsule (12.5 mg total) by mouth daily.  . [DISCONTINUED] amoxicillin (AMOXIL) 875 MG tablet Take 1 tablet (875 mg total) by mouth 2 (two) times daily. 1 po BID   No facility-administered encounter medications on file as of 02/17/2015.      Review of Systems  Constitutional: Negative.   HENT: Positive for congestion (nasal ).   Eyes: Negative.   Respiratory: Negative.   Cardiovascular: Negative.   Gastrointestinal: Negative.   Endocrine: Negative.   Genitourinary: Negative.   Musculoskeletal: Negative.   Skin: Negative.   Allergic/Immunologic: Negative.   Neurological: Negative.   Hematological: Negative.   Psychiatric/Behavioral: Negative.        Objective:   Physical Exam  Constitutional: He is oriented to person,  place, and time. He appears well-developed and well-nourished. No distress.  HENT:  Head: Normocephalic and atraumatic.  Right Ear: External ear normal.  Left Ear: External ear normal.  Mouth/Throat: Oropharynx is clear and moist. No oropharyngeal exudate.  Nasal congestion bilaterally right greater than left. Mouth and throat were clear of lesions.  Eyes: Conjunctivae and EOM are normal. Pupils are equal, round, and reactive to light. Right eye exhibits no discharge. Left eye exhibits no discharge. No scleral icterus.  Neck: Normal range of motion. Neck supple. No thyromegaly present.  No thyromegaly or anterior cervical adenopathy  Cardiovascular: Normal rate, regular rhythm, normal heart sounds and intact distal pulses.   No murmur heard. At 72/m  Pulmonary/Chest: Effort normal and breath sounds normal. No respiratory distress. He has no wheezes. He has no rales. He exhibits no tenderness.  Clear anteriorly and posteriorly  Abdominal: Soft. Bowel sounds are normal. He exhibits no mass. There is no tenderness. There is no rebound and no guarding.  Nontender without masses or organ enlargement or inguinal adenopathy  Genitourinary: Penis normal.  The external genitalia were normal. There was no inguinal hernia palpated on either side. There is no adenopathy rectal exam was not done today because of the patient's age and no family history of prostate cancer.  Musculoskeletal: Normal range of motion. He exhibits no edema.  Lymphadenopathy:    He has no cervical adenopathy.  Neurological: He is alert and oriented to person, place, and time. He has normal reflexes. No cranial nerve deficit.  Skin: Skin is warm and dry. No rash noted.  Psychiatric: He has a normal mood and affect. His behavior is normal. Judgment and thought content normal.  Nursing note and vitals reviewed.  BP 135/81 mmHg  Pulse 61  Temp(Src) 97.3 F (36.3 C) (Oral)  Ht 5' 10"  (1.778 m)  Wt 180 lb (81.647 kg)  BMI  25.83 kg/m2  [facility] reading (PRIMARY) by  Dr. Brunilda Payor x-ray--no active disease  Results for orders placed or performed in visit on 02/17/15  POCT UA - Microscopic Only  Result Value Ref Range   WBC, Ur, HPF, POC neg    RBC, urine, microscopic neg    Bacteria, U Microscopic neg    Mucus, UA neg    Epithelial cells, urine per micros neg    Crystals, Ur, HPF, POC neg    Casts, Ur, LPF, POC neg    Yeast, UA neg   POCT urinalysis dipstick  Result Value Ref Range   Color, UA gold    Clarity, UA clear    Glucose, UA neg    Bilirubin, UA neg    Ketones, UA neg    Spec Grav, UA <=1.005    Blood, UA neg    pH, UA 6.0    Protein, UA neg    Urobilinogen, UA negative    Nitrite, UA neg    Leukocytes, UA Negative Negative                                    EKG: Bradycardia otherwise normal EKG        Assessment & Plan:  1. Annual physical exam -Physical exam done today was excellent with the patient having mostly allergic rhinitis - BMP8+EGFR - CBC with Differential/Platelet - Hepatic function panel - NMR, lipoprofile - Vit D  25 hydroxy (rtn osteoporosis monitoring) - Thyroid Panel With TSH - POCT UA - Microscopic Only - POCT urinalysis dipstick - EKG 12-Lead  2. Essential hypertension -Blood pressure is good today and he will continue with current treatment - BMP8+EGFR - CBC with Differential/Platelet - Hepatic function panel - EKG 12-Lead - amLODipine (NORVASC) 5 MG tablet; Take 1 tablet (5 mg total) by mouth daily.  Dispense: 90 tablet; Refill: 3 - hydrochlorothiazide (MICROZIDE) 12.5 MG capsule; Take 1 capsule (12.5 mg total) by mouth daily.  Dispense: 90 capsule; Refill: 3  3. Hyperlipidemia -He will continue with as aggressive therapeutic lifestyle changes as possible which include diet and exercise - CBC with Differential/Platelet - NMR, lipoprofile - EKG 12-Lead  4. Allergic rhinitis due to pollen -He will try to discontinue the use of Afrin and we will  put him on some Astelin nasal spray at bedtime and have him use nasal saline frequently during the day  Meds ordered this encounter  Medications  . amLODipine (NORVASC) 5 MG tablet    Sig: Take 1 tablet (5 mg total) by mouth daily.    Dispense:  90 tablet    Refill:  3  . hydrochlorothiazide (MICROZIDE) 12.5 MG capsule    Sig: Take 1 capsule (12.5 mg total) by mouth daily.    Dispense:  90 capsule    Refill:  3   Patient Instructions  Continue current medications. Continue good therapeutic lifestyle changes which include good diet and exercise. Fall precautions discussed with patient. If an FOBT was given today- please return it to our front desk.   After your visit  with [place] today you will receive a survey in the mail or online from [employer] regarding your care with [place]. Please take a moment to fill this out. Your feedback is very important to [place] as you can help [place] better understand your patient needs as well as improve your experience and satisfaction. WE CARE ABOUT YOU!!!   The patient should continue to watch his diet as closely as possible and stay as active as possible He should watch his sodium intake closely He should drink plenty of fluids and especially plenty of water He should try to stop chewing tobacco if possible and never start smoking The urinalysis today was clear of infection and red blood cells The chest x-ray as are the been read by the radiologist and this was within normal limits   Arrie Senate MD

## 2015-02-17 NOTE — Patient Instructions (Addendum)
Continue current medications. Continue good therapeutic lifestyle changes which include good diet and exercise. Fall precautions discussed with patient. If an FOBT was given today- please return it to our front desk.   After your visit with [place] today you will receive a survey in the mail or online from [employer] regarding your care with [place]. Please take a moment to fill this out. Your feedback is very important to [place] as you can help [place] better understand your patient needs as well as improve your experience and satisfaction. WE CARE ABOUT YOU!!!   The patient should continue to watch his diet as closely as possible and stay as active as possible He should watch his sodium intake closely He should drink plenty of fluids and especially plenty of water He should try to stop chewing tobacco if possible and never start smoking The urinalysis today was clear of infection and red blood cells The chest x-ray as are the been read by the radiologist and this was within normal limits

## 2015-02-18 ENCOUNTER — Other Ambulatory Visit: Payer: Self-pay | Admitting: *Deleted

## 2015-02-18 LAB — BMP8+EGFR
BUN/Creatinine Ratio: 11 (ref 8–19)
BUN: 12 mg/dL (ref 6–20)
CALCIUM: 9.9 mg/dL (ref 8.7–10.2)
CHLORIDE: 95 mmol/L — AB (ref 97–108)
CO2: 27 mmol/L (ref 18–29)
Creatinine, Ser: 1.11 mg/dL (ref 0.76–1.27)
GFR calc Af Amer: 104 mL/min/{1.73_m2} (ref >59)
GFR calc non Af Amer: 90 mL/min/{1.73_m2} (ref >59)
GLUCOSE: 88 mg/dL (ref 65–99)
POTASSIUM: 4.5 mmol/L (ref 3.5–5.2)
SODIUM: 137 mmol/L (ref 134–144)

## 2015-02-18 LAB — HEPATIC FUNCTION PANEL
ALT: 24 IU/L (ref 0–44)
AST: 21 IU/L (ref 0–40)
Albumin: 4.7 g/dL (ref 3.5–5.5)
Alkaline Phosphatase: 73 IU/L (ref 39–117)
BILIRUBIN TOTAL: 0.4 mg/dL (ref 0.0–1.2)
Bilirubin, Direct: 0.11 mg/dL (ref 0.00–0.40)
Total Protein: 6.8 g/dL (ref 6.0–8.5)

## 2015-02-18 LAB — CBC WITH DIFFERENTIAL/PLATELET
BASOS: 0 %
Basophils Absolute: 0 10*3/uL (ref 0.0–0.2)
EOS (ABSOLUTE): 0.1 10*3/uL (ref 0.0–0.4)
Eos: 1 %
Hematocrit: 47.6 % (ref 37.5–51.0)
Hemoglobin: 16 g/dL (ref 12.6–17.7)
IMMATURE GRANS (ABS): 0 10*3/uL (ref 0.0–0.1)
IMMATURE GRANULOCYTES: 0 %
LYMPHS: 22 %
Lymphocytes Absolute: 1.7 10*3/uL (ref 0.7–3.1)
MCH: 29 pg (ref 26.6–33.0)
MCHC: 33.6 g/dL (ref 31.5–35.7)
MCV: 86 fL (ref 79–97)
Monocytes Absolute: 0.6 10*3/uL (ref 0.1–0.9)
Monocytes: 7 %
NEUTROS PCT: 70 %
Neutrophils Absolute: 5.4 10*3/uL (ref 1.4–7.0)
Platelets: 267 10*3/uL (ref 150–379)
RBC: 5.52 x10E6/uL (ref 4.14–5.80)
RDW: 14.1 % (ref 12.3–15.4)
WBC: 7.8 10*3/uL (ref 3.4–10.8)

## 2015-02-18 LAB — THYROID PANEL WITH TSH
FREE THYROXINE INDEX: 2.2 (ref 1.2–4.9)
T3 Uptake Ratio: 29 % (ref 24–39)
T4, Total: 7.7 ug/dL (ref 4.5–12.0)
TSH: 3.07 u[IU]/mL (ref 0.450–4.500)

## 2015-02-18 LAB — NMR, LIPOPROFILE
CHOLESTEROL: 193 mg/dL (ref 100–199)
HDL CHOLESTEROL BY NMR: 40 mg/dL (ref >39)
HDL Particle Number: 30.8 umol/L (ref >=30.5)
LDL PARTICLE NUMBER: 1462 nmol/L — AB (ref <1000)
LDL Size: 20.5 nm (ref >20.5)
LDL-C: 123 mg/dL — ABNORMAL HIGH (ref 0–99)
LP-IR Score: 51 — ABNORMAL HIGH (ref <=45)
Small LDL Particle Number: 826 nmol/L — ABNORMAL HIGH (ref <=527)
Triglycerides by NMR: 150 mg/dL — ABNORMAL HIGH (ref 0–149)

## 2015-02-18 LAB — VITAMIN D 25 HYDROXY (VIT D DEFICIENCY, FRACTURES): Vit D, 25-Hydroxy: 46 ng/mL (ref 30.0–100.0)

## 2015-02-18 MED ORDER — AZELASTINE HCL 0.1 % NA SOLN: 2.0000 | Freq: Every day | NASAL | Status: DC

## 2015-04-24 ENCOUNTER — Ambulatory Visit (INDEPENDENT_AMBULATORY_CARE_PROVIDER_SITE_OTHER): Payer: 59 | Admitting: Family Medicine

## 2015-04-24 ENCOUNTER — Encounter: Payer: Self-pay | Admitting: Family Medicine

## 2015-04-24 VITALS — BP 124/79 | HR 78 | Ht 70.0 in | Wt 180.4 lb

## 2015-04-24 DIAGNOSIS — L918 Other hypertrophic disorders of the skin: Secondary | ICD-10-CM

## 2015-04-24 NOTE — Progress Notes (Signed)
BP 144/92 mmHg  Pulse 82  Ht  (1.778 m)  Wt 180 lb 6.4 oz (81.829 kg)  BMI 25.88 kg/m2   Subjective:    Patient ID: [name], male    DOB: 1985/11/15, 29 y.o.   MRN: [id number]  HPI: Jacob Benson is a 29 y.o. male presenting on 04/24/2015 for Skin tag removal   HPI Skin tag Patient has had a skin tag on his upper right eyelid that is irritating and prevents his eyelid from fully opening. He often picks at it because it is so irritating and part of it actually came off this past week.  Relevant past medical, surgical, family and social history reviewed and updated as indicated. Interim medical history since our last visit reviewed. Allergies and medications reviewed and updated.  Review of Systems  Constitutional: Negative for fever.  HENT: Negative for ear discharge and ear pain.   Eyes: Negative for discharge and visual disturbance.  Respiratory: Negative for shortness of breath and wheezing.   Cardiovascular: Negative for chest pain and leg swelling.  Gastrointestinal: Negative for abdominal pain, diarrhea and constipation.  Genitourinary: Negative for difficulty urinating.  Musculoskeletal: Negative for back pain and gait problem.  Skin: Negative for rash.       Skin tag  Neurological: Negative for syncope, light-headedness and headaches.  All other systems reviewed and are negative.   Per HPI unless specifically indicated above     Medication List       This list is accurate as of: 04/24/15  4:33 PM.  Always use your most recent med list.               amLODipine 5 MG tablet  Commonly known as:  NORVASC  Take 1 tablet (5 mg total) by mouth daily.     azelastine 0.1 % nasal spray  Commonly known as:  ASTELIN  Place 2 sprays into both nostrils at bedtime. Use in each nostril as directed     hydrochlorothiazide 12.5 MG capsule  Commonly known as:  MICROZIDE  Take 1 capsule (12.5 mg total) by mouth daily.           Objective:    BP  144/92 mmHg  Pulse 82  Ht  (1.778 m)  Wt 180 lb 6.4 oz (81.829 kg)  BMI 25.88 kg/m2  Wt Readings from Last 3 Encounters:  04/24/15 180 lb 6.4 oz (81.829 kg)  02/17/15 180 lb (81.647 kg)  08/16/14 185 lb (83.915 kg)    Physical Exam  Constitutional: He is oriented to person, place, and time. He appears well-developed and well-nourished. No distress.  Eyes: Conjunctivae and EOM are normal. Pupils are equal, round, and reactive to light. Right eye exhibits no discharge. No scleral icterus.  Cardiovascular: Normal rate, regular rhythm, normal heart sounds and intact distal pulses.   No murmur heard. Pulmonary/Chest: Effort normal and breath sounds normal. No respiratory distress. He has no wheezes.  Musculoskeletal: Normal range of motion. He exhibits no edema.  Neurological: He is alert and oriented to person, place, and time. Coordination normal.  Skin: Skin is warm and dry. No rash noted. He is not diaphoretic.     Psychiatric: He has a normal mood and affect. His behavior is normal.  Vitals reviewed.   Skin tag removal: Using forceps and iris scissors skin tag was removed from upper eyelid. Silver nitrate she was used to achieve hemostasis. Pressure dressing was applied over the eye and used to keep  down overnight. After that just keep it clean and dry.     Assessment & Plan:       Problem List Items Addressed This Visit    None    Visit Diagnoses    Skin tag    -  Primary    Patient had irritating skin tag on his upper right eyelid, removed and hemostasis achieved        Follow up plan: Return if symptoms worsen or fail to improve.  Counseling provided for all of the vaccine components No orders of the defined types were placed in this encounter.    Arville CareJoshua Gevork Ayyad, MD [facility] 04/24/2015, 4:33 PM

## 2015-06-15 DIAGNOSIS — S52022A Displaced fracture of olecranon process without intraarticular extension of left ulna, initial encounter for closed fracture: Secondary | ICD-10-CM | POA: Diagnosis not present

## 2015-07-02 DIAGNOSIS — M62830 Muscle spasm of back: Secondary | ICD-10-CM | POA: Diagnosis not present

## 2015-07-02 DIAGNOSIS — M9903 Segmental and somatic dysfunction of lumbar region: Secondary | ICD-10-CM | POA: Diagnosis not present

## 2015-07-28 DIAGNOSIS — M9903 Segmental and somatic dysfunction of lumbar region: Secondary | ICD-10-CM | POA: Diagnosis not present

## 2015-07-28 DIAGNOSIS — M62830 Muscle spasm of back: Secondary | ICD-10-CM | POA: Diagnosis not present

## 2015-07-31 DIAGNOSIS — M9903 Segmental and somatic dysfunction of lumbar region: Secondary | ICD-10-CM | POA: Diagnosis not present

## 2015-07-31 DIAGNOSIS — M62830 Muscle spasm of back: Secondary | ICD-10-CM | POA: Diagnosis not present

## 2015-08-20 ENCOUNTER — Ambulatory Visit (INDEPENDENT_AMBULATORY_CARE_PROVIDER_SITE_OTHER): Payer: 59 | Admitting: Family Medicine

## 2015-08-20 ENCOUNTER — Encounter: Payer: Self-pay | Admitting: Family Medicine

## 2015-08-20 VITALS — BP 125/68 | HR 58 | Temp 97.3°F | Ht 70.0 in | Wt 179.0 lb

## 2015-08-20 DIAGNOSIS — Z1211 Encounter for screening for malignant neoplasm of colon: Secondary | ICD-10-CM | POA: Diagnosis not present

## 2015-08-20 DIAGNOSIS — Z0000 Encounter for general adult medical examination without abnormal findings: Secondary | ICD-10-CM

## 2015-08-20 DIAGNOSIS — J301 Allergic rhinitis due to pollen: Secondary | ICD-10-CM

## 2015-08-20 DIAGNOSIS — M6530 Trigger finger, unspecified finger: Secondary | ICD-10-CM | POA: Diagnosis not present

## 2015-08-20 DIAGNOSIS — I10 Essential (primary) hypertension: Secondary | ICD-10-CM | POA: Diagnosis not present

## 2015-08-20 DIAGNOSIS — E785 Hyperlipidemia, unspecified: Secondary | ICD-10-CM

## 2015-08-20 MED ORDER — AZELASTINE HCL 0.1 % NA SOLN: 2.0000 | Freq: Every day | NASAL | Status: DC

## 2015-08-20 NOTE — Patient Instructions (Addendum)
Continue current medications. Continue good therapeutic lifestyle changes which include good diet and exercise. Fall precautions discussed with patient. If an FOBT was given today- please return it to our front desk.  **Flu shots are available--- please call and schedule a FLU-CLINIC appointment**  After your visit with us today you will receive a survey in the mail or online from [employer] regarding your care with us. Please take a moment to fill this out. Your feedback is very important to us as you can help us better understand your patient needs as well as improve your experience and satisfaction. WE CARE ABOUT YOU!!!   Use the Band-Aid at nighttime on the finger as directed as a splint Take Zantac before supper and take Aleve after supper Continue to monitor blood pressures closely and discontinue Aleve if it causes increased stomach irritation Continue omeprazole as doing If finger pain continues call in 2-3 weeks and we will set up an appointment for an injection by a hand specialist

## 2015-08-20 NOTE — Progress Notes (Signed)
Subjective:    Patient ID: Jacob Benson, male    DOB: 01-07-1986, 30 y.o.   MRN: [id number]  HPI Pt here for follow up and management of chronic medical problems which includes hypertension. He is taking medications regularly.The patient is doing well overall. He does complain of some right hand pain that has been going on for over 2 months. The patient denies chest pain or shortness of breath. He does have some increased problems with allergic rhinitis especially recently. He does use Astelin. He says his blood pressures have been good at home. He's been taking his medicines regularly. He denies any nausea vomiting diarrhea or blood in the stool. He does have occasional heartburn and takes omeprazole for this. He is passing his water without problems. He does not recall any injury to the fifth finger of the right hand. He is left-handed. This is been going on for couple weeks and is especially bothersome at nighttime when he was sleeping. It may be slightly better.    Patient Active Problem List   Diagnosis Date Noted  . HTN (hypertension) 10/17/2012   Outpatient Encounter Prescriptions as of 08/20/2015  Medication Sig  . amLODipine (NORVASC) 5 MG tablet Take 1 tablet (5 mg total) by mouth daily.  . hydrochlorothiazide (MICROZIDE) 12.5 MG capsule Take 1 capsule (12.5 mg total) by mouth daily.  Marland Kitchen azelastine (ASTELIN) 0.1 % nasal spray Place 2 sprays into both nostrils at bedtime. Use in each nostril as directed  . [DISCONTINUED] azelastine (ASTELIN) 0.1 % nasal spray Place 2 sprays into both nostrils at bedtime. Use in each nostril as directed (Patient not taking: Reported on 08/20/2015)   No facility-administered encounter medications on file as of 08/20/2015.      Review of Systems  Constitutional: Negative.   HENT: Negative.   Eyes: Negative.   Respiratory: Negative.   Cardiovascular: Negative.   Gastrointestinal: Negative.   Endocrine: Negative.   Genitourinary: Negative.     Musculoskeletal: Positive for arthralgias (right hand pain x 2 mos).  Skin: Negative.   Allergic/Immunologic: Negative.   Neurological: Negative.   Hematological: Negative.   Psychiatric/Behavioral: Negative.        Objective:   Physical Exam  Constitutional: He is oriented to person, place, and time. He appears well-developed and well-nourished. No distress.  HENT:  Head: Normocephalic and atraumatic.  Right Ear: External ear normal.  Left Ear: External ear normal.  Mouth/Throat: Oropharynx is clear and moist. No oropharyngeal exudate.  Nasal turbinate swelling) greater than left  Eyes: Conjunctivae and EOM are normal. Pupils are equal, round, and reactive to light. Right eye exhibits no discharge. Left eye exhibits no discharge. No scleral icterus.  Neck: Normal range of motion. Neck supple. No thyromegaly present.  No adenopathy  Cardiovascular: Normal rate, regular rhythm and normal heart sounds.   No murmur heard. Pulmonary/Chest: Effort normal and breath sounds normal. No respiratory distress. He has no wheezes. He has no rales. He exhibits no tenderness.  Clear anteriorly and posteriorly no axillary adenopathy  Abdominal: Soft. Bowel sounds are normal. He exhibits no mass. There is no tenderness. There is no rebound and no guarding.  No liver or spleen enlargement and no epigastric tenderness and no inguinal adenopathy  Musculoskeletal: Normal range of motion. He exhibits tenderness. He exhibits no edema.  The metacarpophalangeal joint in the fifth finger of the right hand was tender to palpation he is able to fully extend and flex his finger but definitely tender to palpation at  this point.  Lymphadenopathy:    He has no cervical adenopathy.  Neurological: He is alert and oriented to person, place, and time.  Skin: Skin is warm and dry. No rash noted. No erythema.  Psychiatric: He has a normal mood and affect. His behavior is normal. Judgment and thought content normal.   Nursing note and vitals reviewed.   BP 125/68 mmHg  Pulse 58  Temp(Src) 97.3 F (36.3 C) (Oral)  Ht _0  (1.778 m)  Wt 179 lb (81.194 kg)  BMI 25.68 kg/m2         Assessment & Plan:  1. Essential hypertension -Blood pressure under good control he will continue to watch his sodium intake and monitor his blood pressures at home and especially watch her blood pressure more closely while he is taking his NSAID. - BMP8+EGFR - CBC with Differential/Platelet - Hepatic function panel  2. Hyperlipidemia -Continue with aggressive therapeutic lifestyle changes - CBC with Differential/Platelet - NMR, lipoprofile  3. Health care maintenance - VITAMIN D 25 Hydroxy (Vit-D Deficiency, Fractures)  4. Special screening for malignant neoplasms, colon - Fecal occult blood, imunochemical; Future  5. Trigger finger, acquired Wear Band-Aid especially at nighttime as is within call back in 3-4 weeks and if no better we will arrange for an appointment with the hand surgeon  6. Allergic rhinitis due to pollen -Continue Astelin and respiratory protection  Meds ordered this encounter  Medications  . azelastine (ASTELIN) 0.1 % nasal spray    Sig: Place 2 sprays into both nostrils at bedtime. Use in each nostril as directed    Dispense:  30 mL    Refill:  12   Patient Instructions  Continue current medications. Continue good therapeutic lifestyle changes which include good diet and exercise. Fall precautions discussed with patient. If an FOBT was given today- please return it to our front desk.  **Flu shots are available--- please call and schedule a FLU-CLINIC appointment**  After your visit with [place] today you will receive a survey in the mail or online from [employer] regarding your care with [place]. Please take a moment to fill this out. Your feedback is very important to [place] as you can help [place] better understand your patient needs as well as improve your experience and satisfaction. WE CARE  ABOUT YOU!!!   Use the Band-Aid at nighttime on the finger as directed as a splint Take Zantac before supper and take Aleve after supper Continue to monitor blood pressures closely and discontinue Aleve if it causes increased stomach irritation Continue omeprazole as doing If finger pain continues call in 2-3 weeks and we will set up an appointment for an injection by a hand specialist   Arrie Senate MD

## 2015-08-21 LAB — NMR, LIPOPROFILE
Cholesterol: 174 mg/dL (ref 100–199)
HDL Cholesterol by NMR: 37 mg/dL — ABNORMAL LOW (ref >39)
HDL Particle Number: 26.6 umol/L — ABNORMAL LOW (ref >=30.5)
LDL PARTICLE NUMBER: 1506 nmol/L — AB (ref <1000)
LDL Size: 21.3 nm (ref >20.5)
LDL-C: 116 mg/dL — AB (ref 0–99)
LP-IR SCORE: 36 (ref <=45)
Small LDL Particle Number: 754 nmol/L — ABNORMAL HIGH (ref <=527)
Triglycerides by NMR: 106 mg/dL (ref 0–149)

## 2015-08-21 LAB — CBC WITH DIFFERENTIAL/PLATELET
BASOS: 1 %
Basophils Absolute: 0 10*3/uL (ref 0.0–0.2)
EOS (ABSOLUTE): 0.2 10*3/uL (ref 0.0–0.4)
Eos: 3 %
HEMATOCRIT: 44.6 % (ref 37.5–51.0)
Hemoglobin: 15.2 g/dL (ref 12.6–17.7)
IMMATURE GRANS (ABS): 0 10*3/uL (ref 0.0–0.1)
Immature Granulocytes: 0 %
Lymphocytes Absolute: 1.4 10*3/uL (ref 0.7–3.1)
Lymphs: 27 %
MCH: 29.6 pg (ref 26.6–33.0)
MCHC: 34.1 g/dL (ref 31.5–35.7)
MCV: 87 fL (ref 79–97)
Monocytes Absolute: 0.5 10*3/uL (ref 0.1–0.9)
Monocytes: 9 %
NEUTROS ABS: 3.2 10*3/uL (ref 1.4–7.0)
Neutrophils: 60 %
Platelets: 266 10*3/uL (ref 150–379)
RBC: 5.13 x10E6/uL (ref 4.14–5.80)
RDW: 14.2 % (ref 12.3–15.4)
WBC: 5.3 10*3/uL (ref 3.4–10.8)

## 2015-08-21 LAB — BMP8+EGFR
BUN/Creatinine Ratio: 15 (ref 9–20)
BUN: 18 mg/dL (ref 6–20)
CO2: 26 mmol/L (ref 18–29)
Calcium: 9.6 mg/dL (ref 8.7–10.2)
Chloride: 96 mmol/L (ref 96–106)
Creatinine, Ser: 1.2 mg/dL (ref 0.76–1.27)
GFR, EST AFRICAN AMERICAN: 94 mL/min/{1.73_m2} (ref >59)
GFR, EST NON AFRICAN AMERICAN: 81 mL/min/{1.73_m2} (ref >59)
Glucose: 82 mg/dL (ref 65–99)
POTASSIUM: 4 mmol/L (ref 3.5–5.2)
Sodium: 137 mmol/L (ref 134–144)

## 2015-08-21 LAB — HEPATIC FUNCTION PANEL
ALK PHOS: 76 IU/L (ref 39–117)
ALT: 20 IU/L (ref 0–44)
AST: 21 IU/L (ref 0–40)
Albumin: 4.4 g/dL (ref 3.5–5.5)
Bilirubin Total: 0.6 mg/dL (ref 0.0–1.2)
Bilirubin, Direct: 0.16 mg/dL (ref 0.00–0.40)
Total Protein: 6.7 g/dL (ref 6.0–8.5)

## 2015-08-21 LAB — VITAMIN D 25 HYDROXY (VIT D DEFICIENCY, FRACTURES): VIT D 25 HYDROXY: 46 ng/mL (ref 30.0–100.0)

## 2015-09-13 ENCOUNTER — Emergency Department (HOSPITAL_COMMUNITY): Payer: 59

## 2015-09-13 ENCOUNTER — Emergency Department (HOSPITAL_COMMUNITY): Payer: 59 | Admitting: Anesthesiology

## 2015-09-13 ENCOUNTER — Encounter (HOSPITAL_COMMUNITY)
Admission: EM | Disposition: A | Discharge status: Home / Self Care | Payer: Self-pay | Source: Home / Self Care | Attending: Emergency Medicine

## 2015-09-13 ENCOUNTER — Encounter (HOSPITAL_COMMUNITY): Payer: Self-pay | Admitting: Emergency Medicine

## 2015-09-13 ENCOUNTER — Observation Stay (HOSPITAL_COMMUNITY)
Admission: EM | Admit: 2015-09-13 | Discharge: 2015-09-14 | Disposition: A | Discharge status: Home / Self Care | Payer: 59 | Attending: Orthopaedic Surgery | Admitting: Orthopaedic Surgery

## 2015-09-13 DIAGNOSIS — T148 Other injury of unspecified body region: Secondary | ICD-10-CM | POA: Diagnosis not present

## 2015-09-13 DIAGNOSIS — S0181XA Laceration without foreign body of other part of head, initial encounter: Secondary | ICD-10-CM | POA: Diagnosis not present

## 2015-09-13 DIAGNOSIS — S52022B Displaced fracture of olecranon process without intraarticular extension of left ulna, initial encounter for open fracture type I or II: Secondary | ICD-10-CM

## 2015-09-13 DIAGNOSIS — S0990XA Unspecified injury of head, initial encounter: Secondary | ICD-10-CM | POA: Diagnosis not present

## 2015-09-13 DIAGNOSIS — M7989 Other specified soft tissue disorders: Secondary | ICD-10-CM | POA: Diagnosis not present

## 2015-09-13 DIAGNOSIS — Z9889 Other specified postprocedural states: Secondary | ICD-10-CM

## 2015-09-13 DIAGNOSIS — S064X0A Epidural hemorrhage without loss of consciousness, initial encounter: Secondary | ICD-10-CM | POA: Diagnosis not present

## 2015-09-13 DIAGNOSIS — S9001XA Contusion of right ankle, initial encounter: Secondary | ICD-10-CM | POA: Diagnosis not present

## 2015-09-13 DIAGNOSIS — W11XXXA Fall on and from ladder, initial encounter: Secondary | ICD-10-CM | POA: Diagnosis not present

## 2015-09-13 DIAGNOSIS — E785 Hyperlipidemia, unspecified: Secondary | ICD-10-CM | POA: Diagnosis not present

## 2015-09-13 DIAGNOSIS — S0101XA Laceration without foreign body of scalp, initial encounter: Secondary | ICD-10-CM

## 2015-09-13 DIAGNOSIS — Z419 Encounter for procedure for purposes other than remedying health state, unspecified: Secondary | ICD-10-CM

## 2015-09-13 DIAGNOSIS — Z8781 Personal history of (healed) traumatic fracture: Secondary | ICD-10-CM

## 2015-09-13 DIAGNOSIS — Z87891 Personal history of nicotine dependence: Secondary | ICD-10-CM | POA: Insufficient documentation

## 2015-09-13 DIAGNOSIS — I10 Essential (primary) hypertension: Secondary | ICD-10-CM | POA: Diagnosis not present

## 2015-09-13 DIAGNOSIS — S52022A Displaced fracture of olecranon process without intraarticular extension of left ulna, initial encounter for closed fracture: Principal | ICD-10-CM | POA: Insufficient documentation

## 2015-09-13 DIAGNOSIS — M25571 Pain in right ankle and joints of right foot: Secondary | ICD-10-CM | POA: Diagnosis not present

## 2015-09-13 DIAGNOSIS — S199XXA Unspecified injury of neck, initial encounter: Secondary | ICD-10-CM | POA: Diagnosis not present

## 2015-09-13 HISTORY — PX: ORIF HUMERUS FRACTURE: SHX2126

## 2015-09-13 HISTORY — PX: I&D EXTREMITY: SHX5045

## 2015-09-13 SURGERY — OPEN REDUCTION INTERNAL FIXATION (ORIF) DISTAL HUMERUS FRACTURE
Anesthesia: Regional | Laterality: Left

## 2015-09-13 MED ORDER — PROPOFOL 500 MG/50ML IV EMUL
INTRAVENOUS | Status: DC | PRN
Start: 2015-09-13 — End: 2015-09-13
  Administered 2015-09-13: 75 ug/kg/min via INTRAVENOUS

## 2015-09-13 MED ORDER — DIPHENHYDRAMINE HCL 12.5 MG/5ML PO ELIX: 25.0000 mg | ORAL_SOLUTION | ORAL | Status: DC | PRN

## 2015-09-13 MED ORDER — PHENYLEPHRINE 40 MCG/ML (10ML) SYRINGE FOR IV PUSH (FOR BLOOD PRESSURE SUPPORT)
PREFILLED_SYRINGE | INTRAVENOUS | Status: AC
  Filled 2015-09-13: qty 10

## 2015-09-13 MED ORDER — FENTANYL CITRATE (PF) 100 MCG/2ML IJ SOLN
INTRAMUSCULAR | Status: DC | PRN
  Administered 2015-09-13: 100 ug via INTRAVENOUS

## 2015-09-13 MED ORDER — LACTATED RINGERS IV SOLN
INTRAVENOUS | Status: DC | PRN
  Administered 2015-09-13: 16:00:00 via INTRAVENOUS

## 2015-09-13 MED ORDER — ROCURONIUM BROMIDE 50 MG/5ML IV SOLN
INTRAVENOUS | Status: AC
  Filled 2015-09-13: qty 1

## 2015-09-13 MED ORDER — MORPHINE SULFATE (PF) 4 MG/ML IV SOLN
4.0000 mg | Freq: Once | INTRAVENOUS | Status: AC
  Administered 2015-09-13: 4 mg via INTRAVENOUS
  Filled 2015-09-13: qty 1

## 2015-09-13 MED ORDER — MIDAZOLAM HCL 2 MG/2ML IJ SOLN
INTRAMUSCULAR | Status: AC
  Filled 2015-09-13: qty 2

## 2015-09-13 MED ORDER — METHOCARBAMOL 1000 MG/10ML IJ SOLN
500.0000 mg | Freq: Four times a day (QID) | INTRAVENOUS | Status: DC | PRN
  Filled 2015-09-13: qty 5

## 2015-09-13 MED ORDER — KETOROLAC TROMETHAMINE 30 MG/ML IJ SOLN
30.0000 mg | Freq: Four times a day (QID) | INTRAMUSCULAR | Status: DC | PRN
  Administered 2015-09-13 – 2015-09-14 (×2): 30 mg via INTRAVENOUS
  Filled 2015-09-13 (×2): qty 1

## 2015-09-13 MED ORDER — MIDAZOLAM HCL 5 MG/5ML IJ SOLN
INTRAMUSCULAR | Status: DC | PRN
  Administered 2015-09-13 (×2): 2 mg via INTRAVENOUS

## 2015-09-13 MED ORDER — PROPOFOL 10 MG/ML IV BOLUS
INTRAVENOUS | Status: AC
  Filled 2015-09-13: qty 20

## 2015-09-13 MED ORDER — CEFAZOLIN SODIUM 1-5 GM-% IV SOLN
1.0000 g | Freq: Once | INTRAVENOUS | Status: AC
  Administered 2015-09-13: 1 g via INTRAVENOUS
  Filled 2015-09-13: qty 50

## 2015-09-13 MED ORDER — SORBITOL 70 % SOLN: 30.0000 mL | Freq: Every day | Status: DC | PRN

## 2015-09-13 MED ORDER — FENTANYL CITRATE (PF) 250 MCG/5ML IJ SOLN
INTRAMUSCULAR | Status: AC
  Filled 2015-09-13: qty 5

## 2015-09-13 MED ORDER — OXYCODONE HCL ER 10 MG PO T12A: 10.0000 mg | EXTENDED_RELEASE_TABLET | Freq: Two times a day (BID) | ORAL | Status: DC

## 2015-09-13 MED ORDER — HYDROMORPHONE HCL 1 MG/ML IJ SOLN: 0.2500 mg | INTRAMUSCULAR | Status: DC | PRN

## 2015-09-13 MED ORDER — AMLODIPINE BESYLATE 5 MG PO TABS
5.0000 mg | ORAL_TABLET | Freq: Every day | ORAL | Status: DC
  Administered 2015-09-14: 5 mg via ORAL
  Filled 2015-09-13: qty 1

## 2015-09-13 MED ORDER — ONDANSETRON HCL 4 MG/2ML IJ SOLN
4.0000 mg | Freq: Four times a day (QID) | INTRAMUSCULAR | Status: DC | PRN
  Administered 2015-09-14: 4 mg via INTRAVENOUS
  Filled 2015-09-13: qty 2

## 2015-09-13 MED ORDER — POLYETHYLENE GLYCOL 3350 17 G PO PACK: 17.0000 g | PACK | Freq: Every day | ORAL | Status: DC | PRN

## 2015-09-13 MED ORDER — METHOCARBAMOL 750 MG PO TABS: 750.0000 mg | ORAL_TABLET | Freq: Two times a day (BID) | ORAL | Status: DC | PRN

## 2015-09-13 MED ORDER — METHOCARBAMOL 500 MG PO TABS
500.0000 mg | ORAL_TABLET | Freq: Four times a day (QID) | ORAL | Status: DC | PRN
  Administered 2015-09-14 (×2): 500 mg via ORAL
  Filled 2015-09-13 (×2): qty 1

## 2015-09-13 MED ORDER — ACETAMINOPHEN 650 MG RE SUPP: 650.0000 mg | Freq: Four times a day (QID) | RECTAL | Status: DC | PRN

## 2015-09-13 MED ORDER — POVIDONE-IODINE 10 % EX SWAB: 2.0000 "application " | Freq: Once | CUTANEOUS | Status: DC

## 2015-09-13 MED ORDER — ONDANSETRON HCL 4 MG PO TABS: 4.0000 mg | ORAL_TABLET | Freq: Four times a day (QID) | ORAL | Status: DC | PRN

## 2015-09-13 MED ORDER — ACETAMINOPHEN 325 MG PO TABS
650.0000 mg | ORAL_TABLET | Freq: Four times a day (QID) | ORAL | Status: DC | PRN
  Administered 2015-09-14: 650 mg via ORAL
  Filled 2015-09-13: qty 2

## 2015-09-13 MED ORDER — HYDROCHLOROTHIAZIDE 12.5 MG PO CAPS
12.5000 mg | ORAL_CAPSULE | Freq: Every day | ORAL | Status: DC
  Administered 2015-09-14: 12.5 mg via ORAL
  Filled 2015-09-13: qty 1

## 2015-09-13 MED ORDER — ONDANSETRON HCL 4 MG/2ML IJ SOLN: 4.0000 mg | Freq: Once | INTRAMUSCULAR | Status: DC | PRN

## 2015-09-13 MED ORDER — CEFAZOLIN SODIUM 1-5 GM-% IV SOLN
INTRAVENOUS | Status: AC
  Administered 2015-09-13: 2 g via INTRAVENOUS
  Filled 2015-09-13: qty 50

## 2015-09-13 MED ORDER — METOCLOPRAMIDE HCL 5 MG/ML IJ SOLN: 5.0000 mg | Freq: Three times a day (TID) | INTRAMUSCULAR | Status: DC | PRN

## 2015-09-13 MED ORDER — EPHEDRINE 5 MG/ML INJ
INTRAVENOUS | Status: AC
  Filled 2015-09-13: qty 10

## 2015-09-13 MED ORDER — CEFAZOLIN SODIUM-DEXTROSE 2-4 GM/100ML-% IV SOLN
2.0000 g | Freq: Four times a day (QID) | INTRAVENOUS | Status: AC
  Administered 2015-09-13 – 2015-09-14 (×3): 2 g via INTRAVENOUS
  Filled 2015-09-13 (×3): qty 100

## 2015-09-13 MED ORDER — SODIUM CHLORIDE 0.9 % IR SOLN
Status: DC | PRN
Start: 2015-09-13 — End: 2015-09-13
  Administered 2015-09-13: 5000 mL

## 2015-09-13 MED ORDER — CEFAZOLIN SODIUM-DEXTROSE 2-4 GM/100ML-% IV SOLN: 2.0000 g | INTRAVENOUS | Status: DC

## 2015-09-13 MED ORDER — OXYCODONE HCL 5 MG PO TABS: 5.0000 mg | ORAL_TABLET | ORAL | Status: DC | PRN

## 2015-09-13 MED ORDER — CHLORHEXIDINE GLUCONATE 4 % EX LIQD: 60.0000 mL | Freq: Once | CUTANEOUS | Status: DC

## 2015-09-13 MED ORDER — ONDANSETRON HCL 4 MG PO TABS: 4.0000 mg | ORAL_TABLET | Freq: Three times a day (TID) | ORAL | Status: DC | PRN

## 2015-09-13 MED ORDER — SODIUM CHLORIDE 0.9 % IV SOLN: INTRAVENOUS | Status: DC

## 2015-09-13 MED ORDER — OXYCODONE HCL 5 MG PO TABS
5.0000 mg | ORAL_TABLET | ORAL | Status: DC | PRN
  Administered 2015-09-14: 15 mg via ORAL
  Administered 2015-09-14: 10 mg via ORAL
  Filled 2015-09-13: qty 2
  Filled 2015-09-13: qty 3

## 2015-09-13 MED ORDER — MORPHINE SULFATE (PF) 2 MG/ML IV SOLN: 1.0000 mg | INTRAVENOUS | Status: DC | PRN

## 2015-09-13 MED ORDER — SODIUM CHLORIDE 0.9 % IV SOLN
INTRAVENOUS | Status: DC
  Administered 2015-09-13: 22:00:00 via INTRAVENOUS

## 2015-09-13 MED ORDER — SENNOSIDES-DOCUSATE SODIUM 8.6-50 MG PO TABS: 1.0000 | ORAL_TABLET | Freq: Every evening | ORAL | Status: DC | PRN

## 2015-09-13 MED ORDER — MAGNESIUM CITRATE PO SOLN: 1.0000 | Freq: Once | ORAL | Status: DC | PRN

## 2015-09-13 MED ORDER — MEPERIDINE HCL 25 MG/ML IJ SOLN: 6.2500 mg | INTRAMUSCULAR | Status: DC | PRN

## 2015-09-13 MED ORDER — LIDOCAINE HCL (PF) 1 % IJ SOLN
5.0000 mL | Freq: Once | INTRAMUSCULAR | Status: AC
  Administered 2015-09-13: 5 mL
  Filled 2015-09-13: qty 5

## 2015-09-13 MED ORDER — METOCLOPRAMIDE HCL 5 MG PO TABS: 5.0000 mg | ORAL_TABLET | Freq: Three times a day (TID) | ORAL | Status: DC | PRN

## 2015-09-13 MED ORDER — AZELASTINE HCL 0.1 % NA SOLN
2.0000 | Freq: Every day | NASAL | Status: DC
  Filled 2015-09-13: qty 30

## 2015-09-13 MED ORDER — OXYCODONE HCL ER 10 MG PO T12A
10.0000 mg | EXTENDED_RELEASE_TABLET | Freq: Two times a day (BID) | ORAL | Status: DC
  Administered 2015-09-13 – 2015-09-14 (×2): 10 mg via ORAL
  Filled 2015-09-13 (×2): qty 1

## 2015-09-13 SURGICAL SUPPLY — 109 items
BANDAGE ACE 4X5 VEL STRL LF (GAUZE/BANDAGES/DRESSINGS) ×2 IMPLANT
BANDAGE ELASTIC 3 VELCRO ST LF (GAUZE/BANDAGES/DRESSINGS) IMPLANT
BIT DRILL 2.0 (BIT) ×2
BIT DRILL 2.0MM (BIT) ×1
BIT DRILL 2.5X2.75 QC CALB (BIT) ×2 IMPLANT
BIT DRILL 2XNS DISP SS SM FRAG (BIT) IMPLANT
BIT DRILL CALIBRATED 2.7 (BIT) ×1 IMPLANT
BIT DRILL CALIBRATED 2.7MM (BIT) ×1
BIT DRL 2XNS DISP SS SM FRAG (BIT) ×1
BLADE SURG 10 STRL SS (BLADE) ×3 IMPLANT
BNDG CMPR 9X4 STRL LF SNTH (GAUZE/BANDAGES/DRESSINGS) ×1
BNDG COHESIVE 1X5 TAN STRL LF (GAUZE/BANDAGES/DRESSINGS) IMPLANT
BNDG COHESIVE 4X5 TAN STRL (GAUZE/BANDAGES/DRESSINGS) ×1 IMPLANT
BNDG COHESIVE 6X5 TAN STRL LF (GAUZE/BANDAGES/DRESSINGS) ×2 IMPLANT
BNDG CONFORM 3 STRL LF (GAUZE/BANDAGES/DRESSINGS) IMPLANT
BNDG ESMARK 4X9 LF (GAUZE/BANDAGES/DRESSINGS) ×2 IMPLANT
BNDG GAUZE ELAST 4 BULKY (GAUZE/BANDAGES/DRESSINGS) ×2 IMPLANT
BNDG GAUZE STRTCH 6 (GAUZE/BANDAGES/DRESSINGS) ×3 IMPLANT
CANISTER SUCTION 1500CC (MISCELLANEOUS) ×4 IMPLANT
CLOSURE WOUND 1/2 X4 (GAUZE/BANDAGES/DRESSINGS)
CORDS BIPOLAR (ELECTRODE) IMPLANT
COVER SURGICAL LIGHT HANDLE (MISCELLANEOUS) ×3 IMPLANT
CUFF TOURNIQUET SINGLE 18IN (TOURNIQUET CUFF) ×3 IMPLANT
CUFF TOURNIQUET SINGLE 24IN (TOURNIQUET CUFF) IMPLANT
CUFF TOURNIQUET SINGLE 34IN LL (TOURNIQUET CUFF) ×6 IMPLANT
CUFF TOURNIQUET SINGLE 44IN (TOURNIQUET CUFF) IMPLANT
DRAPE C-ARM 42X72 X-RAY (DRAPES) ×3 IMPLANT
DRAPE EXTREMITY BILATERAL (DRAPES) IMPLANT
DRAPE IMP U-DRAPE 54X76 (DRAPES) ×3 IMPLANT
DRAPE INCISE IOBAN 66X45 STRL (DRAPES) ×4 IMPLANT
DRAPE SURG 17X23 STRL (DRAPES) IMPLANT
DRAPE U-SHAPE 47X51 STRL (DRAPES) ×3 IMPLANT
DRILL SLEEVE 2.7 DIST TIB (TRAUMA) ×2
DRSG TEGADERM 4X4.75 (GAUZE/BANDAGES/DRESSINGS) ×4 IMPLANT
DURAPREP 26ML APPLICATOR (WOUND CARE) ×3 IMPLANT
ELECT CAUTERY BLADE 6.4 (BLADE) ×3 IMPLANT
ELECT REM PT RETURN 9FT ADLT (ELECTROSURGICAL) ×3
ELECTRODE REM PT RTRN 9FT ADLT (ELECTROSURGICAL) ×1 IMPLANT
FACESHIELD WRAPAROUND (MASK) IMPLANT
FACESHIELD WRAPAROUND OR TEAM (MASK) ×1 IMPLANT
GAUZE SPONGE 4X4 12PLY STRL (GAUZE/BANDAGES/DRESSINGS) ×6 IMPLANT
GAUZE XEROFORM 1X8 LF (GAUZE/BANDAGES/DRESSINGS) ×3 IMPLANT
GAUZE XEROFORM 5X9 LF (GAUZE/BANDAGES/DRESSINGS) ×3 IMPLANT
GLOVE SKINSENSE NS SZ7.5 (GLOVE) ×8
GLOVE SKINSENSE STRL SZ7.5 (GLOVE) ×4 IMPLANT
GOWN STRL REIN XL XLG (GOWN DISPOSABLE) ×4 IMPLANT
HANDPIECE INTERPULSE COAX TIP (DISPOSABLE)
K-WIRE ACE 1.6X6 (WIRE) ×9
KIT BASIN OR (CUSTOM PROCEDURE TRAY) ×3 IMPLANT
KIT ROOM TURNOVER OR (KITS) ×3 IMPLANT
KWIRE ACE 1.6X6 (WIRE) IMPLANT
MANIFOLD NEPTUNE II (INSTRUMENTS) ×1 IMPLANT
NS IRRIG 1000ML POUR BTL (IV SOLUTION) ×12 IMPLANT
PACK ORTHO EXTREMITY (CUSTOM PROCEDURE TRAY) ×1 IMPLANT
PACK SHOULDER (CUSTOM PROCEDURE TRAY) ×3 IMPLANT
PACK UNIVERSAL I (CUSTOM PROCEDURE TRAY) ×1 IMPLANT
PAD ABD 8X10 STRL (GAUZE/BANDAGES/DRESSINGS) ×3 IMPLANT
PAD ARMBOARD 7.5X6 YLW CONV (MISCELLANEOUS) ×6 IMPLANT
PAD CAST 4YDX4 CTTN HI CHSV (CAST SUPPLIES) ×1 IMPLANT
PADDING CAST ABS 4INX4YD NS (CAST SUPPLIES) ×4
PADDING CAST ABS COTTON 4X4 ST (CAST SUPPLIES) ×2 IMPLANT
PADDING CAST COTTON 4X4 STRL (CAST SUPPLIES) ×3
PADDING CAST COTTON 6X4 STRL (CAST SUPPLIES) ×1 IMPLANT
PLATE OLECRANON LRG (Plate) ×2 IMPLANT
SCREW CORT 3.5X26 (Screw) ×6 IMPLANT
SCREW CORT T15 26X3.5XST LCK (Screw) IMPLANT
SCREW CORTICAL 2.7MM  20MM (Screw) ×2 IMPLANT
SCREW CORTICAL 2.7MM 20MM (Screw) IMPLANT
SCREW LOCK CORT STAR 3.5X14 (Screw) ×2 IMPLANT
SCREW LOCK CORT STAR 3.5X16 (Screw) ×2 IMPLANT
SCREW LOCK CORT STAR 3.5X18 (Screw) ×2 IMPLANT
SCREW LOCK CORT STAR 3.5X20 (Screw) ×2 IMPLANT
SCREW LOCK CORT STAR 3.5X22 (Screw) ×2 IMPLANT
SCREW LOCK CORT STAR 3.5X52 (Screw) ×2 IMPLANT
SCREW LOW PROFILE 18MMX3.5MM (Screw) ×2 IMPLANT
SCREW LOW PROFILE 22MMX3.5MM (Screw) ×4 IMPLANT
SET HNDPC FAN SPRY TIP SCT (DISPOSABLE) IMPLANT
SLEEVE DRILL 2.7 DIST TIB (TRAUMA) IMPLANT
SLING ARM IMMOBILIZER LRG (SOFTGOODS) ×3 IMPLANT
SLING ARM IMMOBILIZER XL (CAST SUPPLIES) ×2 IMPLANT
SPLINT FIBERGLASS 4X15 (CAST SUPPLIES) ×2 IMPLANT
SPONGE GAUZE 4X4 12PLY STER LF (GAUZE/BANDAGES/DRESSINGS) ×2 IMPLANT
SPONGE LAP 18X18 X RAY DECT (DISPOSABLE) ×6 IMPLANT
STAPLER VISISTAT 35W (STAPLE) IMPLANT
STOCKINETTE IMPERVIOUS 9X36 MD (GAUZE/BANDAGES/DRESSINGS) ×1 IMPLANT
STRIP CLOSURE SKIN 1/2X4 (GAUZE/BANDAGES/DRESSINGS) IMPLANT
SUCTION FRAZIER HANDLE 10FR (MISCELLANEOUS)
SUCTION TUBE FRAZIER 10FR DISP (MISCELLANEOUS) IMPLANT
SUT ETHILON 2 0 FS 18 (SUTURE) ×3 IMPLANT
SUT ETHILON 2 0 PSLX (SUTURE) ×1 IMPLANT
SUT ETHILON 3 0 PS 1 (SUTURE) ×8 IMPLANT
SUT VIC AB 0 CT1 27 (SUTURE) ×3
SUT VIC AB 0 CT1 27XBRD ANBCTR (SUTURE) ×1 IMPLANT
SUT VIC AB 2-0 CT1 27 (SUTURE) ×3
SUT VIC AB 2-0 CT1 36 (SUTURE) ×1 IMPLANT
SUT VIC AB 2-0 CT1 TAPERPNT 27 (SUTURE) ×1 IMPLANT
SUT VIC AB 2-0 FS1 27 (SUTURE) ×2 IMPLANT
SYR CONTROL 10ML LL (SYRINGE) IMPLANT
TOWEL OR 17X24 6PK STRL BLUE (TOWEL DISPOSABLE) ×3 IMPLANT
TOWEL OR 17X26 10 PK STRL BLUE (TOWEL DISPOSABLE) ×3 IMPLANT
TUBE ANAEROBIC SPECIMEN COL (MISCELLANEOUS) IMPLANT
TUBE CONNECTING 12'X1/4 (SUCTIONS)
TUBE CONNECTING 12X1/4 (SUCTIONS) ×1 IMPLANT
TUBE FEEDING 5FR 15 INCH (TUBING) IMPLANT
TUBING CYSTO DISP (UROLOGICAL SUPPLIES) ×1 IMPLANT
UNDERPAD 30X30 INCONTINENT (UNDERPADS AND DIAPERS) ×6 IMPLANT
WASHER 3.5MM (Orthopedic Implant) ×4 IMPLANT
WATER STERILE IRR 1000ML POUR (IV SOLUTION) ×1 IMPLANT
YANKAUER SUCT BULB TIP NO VENT (SUCTIONS) ×1 IMPLANT

## 2015-09-13 NOTE — Anesthesia Preprocedure Evaluation (Signed)
Anesthesia Evaluation  Patient identified by MRN, date of birth, ID band Patient awake    Reviewed: Allergy & Precautions, NPO status , Patient's Chart, lab work & pertinent test results  Airway Mallampati: I  TM Distance: >3 FB Neck ROM: Full    Dental   Pulmonary former smoker,    Pulmonary exam normal       Cardiovascular hypertension, Pt. on medications Normal cardiovascular exam    Neuro/Psych    GI/Hepatic   Endo/Other    Renal/GU      Musculoskeletal   Abdominal   Peds  Hematology   Anesthesia Other Findings   Reproductive/Obstetrics                             Anesthesia Physical Anesthesia Plan  ASA: II  Anesthesia Plan: Regional   Post-op Pain Management:    Induction: Intravenous  Airway Management Planned: Simple Face Mask  Additional Equipment:   Intra-op Plan:   Post-operative Plan:   Informed Consent: I have reviewed the patients History and Physical, chart, labs and discussed the procedure including the risks, benefits and alternatives for the proposed anesthesia with the patient or authorized representative who has indicated his/her understanding and acceptance.     Plan Discussed with: CRNA and Surgeon  Anesthesia Plan Comments:         Anesthesia Quick Evaluation  

## 2015-09-13 NOTE — Transfer of Care (Signed)
Immediate Anesthesia Transfer of Care Note  Patient: Jacob Benson  Procedure(s) Performed: Procedure(s): OPEN REDUCTION INTERNAL FIXATION (ORIF) ELBOW/OLECRANON AND I AND D (Left) IRRIGATION AND DEBRIDEMENT EXTREMITY (Left)  Patient Location: PACU  Anesthesia Type:Regional  Level of Consciousness: awake, alert , oriented and patient cooperative  Airway & Oxygen Therapy: Patient Spontanous Breathing and Patient connected to nasal cannula oxygen  Post-op Assessment: Report given to RN, Post -op Vital signs reviewed and stable, Patient moving all extremities and Patient moving all extremities X 4  Post vital signs: Reviewed and stable  Last Vitals:  Filed Vitals:   09/13/15 1500 09/13/15 1515  BP: 132/70 104/71  Pulse: 67 64  Temp:    Resp:  16    Last Pain:  Filed Vitals:   09/13/15 1523  PainSc: 8          Complications: No apparent anesthesia complications

## 2015-09-13 NOTE — H&P (Signed)
ORTHOPAEDIC HISTORY AND PHYSICAL   Chief Complaint: Left open elbow injury  HPI: Jacob Benson is a 30 y.o. male who complains of left open elbow fx s/p 8-10 fall from ladder, denies LOC, amnesia.  C/o left ankle pain, xrays neg.  Pain is severe in elbow, nonradiating, throbbing, worse with movement.  Trauma w/u neg by ER.  Ortho asked to consult.  Past Medical History  Diagnosis Date  . Hypertension   . Hyperlipidemia   . Calcium kidney stones 2007   Past Surgical History  Procedure Laterality Date  . Tonsillectomy    . Fracture surgery     Social History   Social History  . Marital Status: Married    Spouse Name: N/A  . Number of Children: N/A  . Years of Education: N/A   Social History Main Topics  . Smoking status: Former Smoker -- 0.50 packs/day    Types: Cigarettes    Quit date: 05/23/2013  . Smokeless tobacco: Former Neurosurgeon  . Alcohol Use: No  . Drug Use: No  . Sexual Activity: Not Asked   Other Topics Concern  . None   Social History Narrative   Family History  Problem Relation Age of Onset  . Hypertension Mother   . Hypertension Father   . Hypertension Sister   . Heart disease Maternal Grandmother   . CAD Maternal Grandmother     bypass surgery   Allergies  Allergen Reactions  . Guaifenesin & Derivatives Palpitations   Prior to Admission medications   Medication Sig Start Date End Date Taking? Authorizing Provider  amLODipine (NORVASC) 5 MG tablet Take 1 tablet (5 mg total) by mouth daily. 02/17/15  Yes Ernestina Penna, MD  azelastine (ASTELIN) 0.1 % nasal spray Place 2 sprays into both nostrils at bedtime. Use in each nostril as directed 08/20/15  Yes Ernestina Penna, MD  hydrochlorothiazide (MICROZIDE) 12.5 MG capsule Take 1 capsule (12.5 mg total) by mouth daily. 02/17/15  Yes Ernestina Penna, MD  naproxen sodium (ALEVE) 220 MG tablet Take 440 mg by mouth daily.   Yes Historical Provider, MD   Dg Elbow Complete Left  09/13/2015  CLINICAL DATA:   Fall from 10 feet, severe left oval pain. EXAM: LEFT ELBOW - COMPLETE 3+ VIEW COMPARISON:  None. FINDINGS: Significantly displaced/ comminuted fracture of the olecranon. The proximal fracture fragment remains grossly well positioned relative to the distal humerus. No discrete fracture seen within the distal humerus or proximal radius. Joint effusion is present. IMPRESSION: 1. Significantly displaced/comminuted fractures of the olecranon. Proximal fracture fragment remains grossly well positioned relative to the distal humerus. 2. No fracture seen within the humerus or proximal radius. 3. Joint effusion. Electronically Signed   By: Bary Richard M.D.   On: 09/13/2015 12:37   Dg Ankle Complete Right  09/13/2015  CLINICAL DATA:  Fall from 10 feet, right ankle pain. Medial soft tissue swelling. EXAM: RIGHT ANKLE - COMPLETE 3+ VIEW COMPARISON:  None. FINDINGS: Osseous alignment is normal. Bone mineralization is normal. No fracture line or displaced fracture fragment seen. Ankle mortise is symmetric. Visualized portions of the hindfoot and midfoot appear intact and normally aligned. Prominent soft tissue swelling overlying the medial malleolus. IMPRESSION: Soft tissue swelling.  No osseous fracture or dislocation seen. Electronically Signed   By: Bary Richard M.D.   On: 09/13/2015 12:38   Ct Head Wo Contrast  09/13/2015  CLINICAL DATA:  Larey Seat 8 feet off ladder hit back of head on cement EXAM:  CT HEAD WITHOUT CONTRAST CT CERVICAL SPINE WITHOUT CONTRAST TECHNIQUE: Multidetector CT imaging of the head and cervical spine was performed following the standard protocol without intravenous contrast. Multiplanar CT image reconstructions of the cervical spine were also generated. COMPARISON:  None. FINDINGS: CT HEAD FINDINGS Mild posterior lateral left scalp hematoma near the vertex. No skull fracture. No hemorrhagic contusion or extra-axial fluid. No mass, infarct, or hydrocephalus. CT CERVICAL SPINE FINDINGS No acute soft  tissue abnormalities. Thyroid is normal. Lung apices are clear. Normal alignment. No fracture. IMPRESSION: Negative except for scalp hematoma. Electronically Signed   By: Esperanza Heiraymond  Rubner M.D.   On: 09/13/2015 12:13   Ct Cervical Spine Wo Contrast  09/13/2015  CLINICAL DATA:  Larey SeatFell 8 feet off ladder hit back of head on cement EXAM: CT HEAD WITHOUT CONTRAST CT CERVICAL SPINE WITHOUT CONTRAST TECHNIQUE: Multidetector CT imaging of the head and cervical spine was performed following the standard protocol without intravenous contrast. Multiplanar CT image reconstructions of the cervical spine were also generated. COMPARISON:  None. FINDINGS: CT HEAD FINDINGS Mild posterior lateral left scalp hematoma near the vertex. No skull fracture. No hemorrhagic contusion or extra-axial fluid. No mass, infarct, or hydrocephalus. CT CERVICAL SPINE FINDINGS No acute soft tissue abnormalities. Thyroid is normal. Lung apices are clear. Normal alignment. No fracture. IMPRESSION: Negative except for scalp hematoma. Electronically Signed   By: Esperanza Heiraymond  Rubner M.D.   On: 09/13/2015 12:13   - pertinent xrays, CT, MRI studies were reviewed and independently interpreted  Positive ROS: All other systems have been reviewed and were otherwise negative with the exception of those mentioned in the HPI and as above.  Physical Exam: General: Alert, no acute distress Cardiovascular: No pedal edema Respiratory: No cyanosis, no use of accessory musculature GI: No organomegaly, abdomen is soft and non-tender Skin: No lesions in the area of chief complaint Neurologic: Sensation intact distally Psychiatric: Patient is competent for consent with normal mood and affect Lymphatic: No axillary or cervical lymphadenopathy  MUSCULOSKELETAL:  - small 1 cm open laceration over olecranon, no gross contamination - LUE NVI - hand wwp  Assessment: Left open olecranon fracture  Plan: - 2 g ancef given in ER - to OR for I&D and ORIF today -  admit to ortho postop for abx   N. Glee ArvinMichael Sirron Francesconi, MD [facility] [phone] 2:44 PM

## 2015-09-13 NOTE — ED Notes (Signed)
PT SATURATED abd PAD. bandage changed and pressure dressing applied. Bleeding is controlled at this time. MD made aware.

## 2015-09-13 NOTE — Discharge Instructions (Signed)
Postoperative instructions: ° °Weightbearing: non weight bearing ° °Dressing instructions: Keep your dressing and/or splint clean and dry at all times.  It will be removed at your first post-operative appointment.  Your stitches and/or staples will be removed at this visit. ° °Incision instructions:  Do not soak your incision for 3 weeks after surgery.  If the incision gets wet, pat dry and do not scrub the incision. ° °Pain control:  You have been given a prescription to be taken as directed for post-operative pain control.  In addition, elevate the operative extremity above the heart at all times to prevent swelling and throbbing pain. ° °Take over-the-counter Colace, 100mg by mouth twice a day while taking narcotic pain medications to help prevent constipation. ° °Follow up appointments: °1) 10-14 days for suture removal and wound check. °2) Dr. Nishi Neiswonger as scheduled. ° ° ------------------------------------------------------------------------------------------------------------- ° °After Surgery Pain Control: ° °After your surgery, post-surgical discomfort or pain is likely. This discomfort can last several days to a few weeks. At certain times of the day your discomfort may be more intense.  °Did you receive a nerve block?  °A nerve block can provide pain relief for one hour to two days after your surgery. As long as the nerve block is working, you will experience little or no sensation in the area the surgeon operated on.  °As the nerve block wears off, you will begin to experience pain or discomfort. It is very important that you begin taking your prescribed pain medication before the nerve block fully wears off. Treating your pain at the first sign of the block wearing off will ensure your pain is better controlled and more tolerable when full-sensation returns. Do not wait until the pain is intolerable, as the medicine will be less effective. It is better to treat pain in advance than to try and catch up.    °General Anesthesia:  °If you did not receive a nerve block during your surgery, you will need to start taking your pain medication shortly after your surgery and should continue to do so as prescribed by your surgeon.  °Pain Medication:  °Most commonly we prescribe Vicodin and Percocet for post-operative pain. Both of these medications contain a combination of acetaminophen (Tylenol®) and a narcotic to help control pain.  °· It takes between 30 and 45 minutes before pain medication starts to work. It is important to take your medication before your pain level gets too intense.  °· Nausea is a common side effect of many pain medications. You will want to eat something before taking your pain medicine to help prevent nausea.  °· If you are taking a prescription pain medication that contains acetaminophen, we recommend that you do not take additional over the counter acetaminophen (Tylenol®).  °Other pain relieving options:  °· Using a cold pack to ice the affected area a few times a day (15 to 20 minutes at a time) can help to relieve pain, reduce swelling and bruising.  °· Elevation of the affected area can also help to reduce pain and swelling. ° ° ° °

## 2015-09-13 NOTE — ED Notes (Signed)
Report called to OR short stay ° °

## 2015-09-13 NOTE — ED Notes (Signed)
Pt was up a ladder approximately 8 foot when the ladder slipped and pt fell backwards hitting his left elbow and back of head on cement. Pt also has swelling to left ankle. Pt has laceration to back of head and left elbow. Bleeding is controlled with both lacerations at this time with ABD pad and gauze. Pt denies any LOC.

## 2015-09-13 NOTE — ED Provider Notes (Signed)
CSN: [id number]     Arrival date & time 09/13/15  1035 History   First MD Initiated Contact with Patient 09/13/15 1059     Chief Complaint  Patient presents with  . Fall     (Consider location/radiation/quality/duration/timing/severity/associated sxs/prior Treatment) HPI Comments: Patient is a 30 year old male who presents following a fall from a 8-10 foot ladder. Patient states he was climbing a ladder when it came out from under him. He states he landed on his left elbow, and his head "bounced off the pavement." The patient states it severely hurts to move his left elbow. Patient denies loss of consciousness. Patient denies headache except where he has a laceration. Patient is endorsing severe pain to his left elbow. He also has pain and swelling to his right ankle. He endorses soreness to his right lower back. Patient states he felt some nausea and dizziness when it first happened, but that has subsided. The patient denies chest pain, shortness of breath, abdominal pain, vomiting, dysuria. Patient's tetanus is up-to-date from January 2014.  Patient is a 30 y.o. male presenting with fall. The history is provided by the patient.  Fall Associated symptoms include nausea. Pertinent negatives include no abdominal pain, chest pain, chills, fever, rash, sore throat or vomiting.    Past Medical History  Diagnosis Date  . Hypertension   . Hyperlipidemia   . Calcium kidney stones 2007   Past Surgical History  Procedure Laterality Date  . Tonsillectomy    . Fracture surgery     Family History  Problem Relation Age of Onset  . Hypertension Mother   . Hypertension Father   . Hypertension Sister   . Heart disease Maternal Grandmother   . CAD Maternal Grandmother     bypass surgery   Social History  Substance Use Topics  . Smoking status: Former Smoker -- 0.50 packs/day    Types: Cigarettes    Quit date: 05/23/2013  . Smokeless tobacco: Former NeurosurgeonUser  . Alcohol Use: No    Review of  Systems  Constitutional: Negative for fever and chills.  HENT: Negative for facial swelling and sore throat.   Respiratory: Negative for shortness of breath.   Cardiovascular: Negative for chest pain.  Gastrointestinal: Positive for nausea. Negative for vomiting and abdominal pain.  Genitourinary: Negative for dysuria.  Musculoskeletal: Negative for back pain.  Skin: Negative for rash and wound.  Neurological: Positive for dizziness.  Psychiatric/Behavioral: The patient is not nervous/anxious.       Allergies  Guaifenesin & derivatives  Home Medications   Prior to Admission medications   Medication Sig Start Date End Date Taking? Authorizing Provider  amLODipine (NORVASC) 5 MG tablet Take 1 tablet (5 mg total) by mouth daily. 02/17/15  Yes Ernestina Pennaonald W Moore, MD  azelastine (ASTELIN) 0.1 % nasal spray Place 2 sprays into both nostrils at bedtime. Use in each nostril as directed 08/20/15  Yes Ernestina Pennaonald W Moore, MD  hydrochlorothiazide (MICROZIDE) 12.5 MG capsule Take 1 capsule (12.5 mg total) by mouth daily. 02/17/15  Yes Ernestina Pennaonald W Moore, MD  naproxen sodium (ALEVE) 220 MG tablet Take 440 mg by mouth daily.   Yes Historical Provider, MD   BP 104/71 mmHg  Pulse 64  Temp(Src) 98.3 F (36.8 C) (Oral)  Resp 16  Ht 5\' 10"  (1.778 m)  Wt 81.194 kg  BMI 25.68 kg/m2  SpO2 100% Physical Exam  Constitutional: He appears well-developed and well-nourished. No distress.  HENT:  Head: Normocephalic and atraumatic.  Mouth/Throat: Oropharynx is clear  and moist. No oropharyngeal exudate.  Eyes: Conjunctivae are normal. Pupils are equal, round, and reactive to light. Right eye exhibits no discharge. Left eye exhibits no discharge. No scleral icterus.  Neck: Normal range of motion. Neck supple. No thyromegaly present.  Cardiovascular: Normal rate, regular rhythm, normal heart sounds and intact distal pulses.  Exam reveals no gallop and no friction rub.   No murmur heard. Pulmonary/Chest: Effort normal  and breath sounds normal. No stridor. No respiratory distress. He has no wheezes. He has no rales.  Abdominal: Soft. Bowel sounds are normal. He exhibits no distension. There is no tenderness. There is no rebound and no guarding.  Musculoskeletal: He exhibits no edema.       Left elbow: He exhibits decreased range of motion, swelling and laceration. Tenderness found.       Right ankle: Tenderness. Medial malleolus tenderness found.  1.5cm laceration over olecranon process of left elbow; actively bleeding without pressure; 3cm laceration to posterior scalp with overlying hematoma (8cm diameter), bleeding controlled; abrasion, edema, ecchymosis, and tenderness over medial malleolus of right ankle, no pain with full range of motion  Lymphadenopathy:    He has no cervical adenopathy.  Neurological: He is alert. Coordination normal.  CN 3-12 intact, Normal sensation throughout, 5/5 strength,  Skin: Skin is warm and dry. No rash noted. He is not diaphoretic. No pallor.  Psychiatric: He has a normal mood and affect.  Nursing note and vitals reviewed.   ED Course  Procedures (including critical care time) Labs Review Labs Reviewed - No data to display  Imaging Review Dg Elbow Complete Left  09/13/2015  CLINICAL DATA:  Fall from 10 feet, severe left oval pain. EXAM: LEFT ELBOW - COMPLETE 3+ VIEW COMPARISON:  None. FINDINGS: Significantly displaced/ comminuted fracture of the olecranon. The proximal fracture fragment remains grossly well positioned relative to the distal humerus. No discrete fracture seen within the distal humerus or proximal radius. Joint effusion is present. IMPRESSION: 1. Significantly displaced/comminuted fractures of the olecranon. Proximal fracture fragment remains grossly well positioned relative to the distal humerus. 2. No fracture seen within the humerus or proximal radius. 3. Joint effusion. Electronically Signed   By: Bary Richard M.D.   On: 09/13/2015 12:37   Dg Ankle  Complete Right  09/13/2015  CLINICAL DATA:  Fall from 10 feet, right ankle pain. Medial soft tissue swelling. EXAM: RIGHT ANKLE - COMPLETE 3+ VIEW COMPARISON:  None. FINDINGS: Osseous alignment is normal. Bone mineralization is normal. No fracture line or displaced fracture fragment seen. Ankle mortise is symmetric. Visualized portions of the hindfoot and midfoot appear intact and normally aligned. Prominent soft tissue swelling overlying the medial malleolus. IMPRESSION: Soft tissue swelling.  No osseous fracture or dislocation seen. Electronically Signed   By: Bary Richard M.D.   On: 09/13/2015 12:38   Ct Head Wo Contrast  09/13/2015  CLINICAL DATA:  Larey Seat 8 feet off ladder hit back of head on cement EXAM: CT HEAD WITHOUT CONTRAST CT CERVICAL SPINE WITHOUT CONTRAST TECHNIQUE: Multidetector CT imaging of the head and cervical spine was performed following the standard protocol without intravenous contrast. Multiplanar CT image reconstructions of the cervical spine were also generated. COMPARISON:  None. FINDINGS: CT HEAD FINDINGS Mild posterior lateral left scalp hematoma near the vertex. No skull fracture. No hemorrhagic contusion or extra-axial fluid. No mass, infarct, or hydrocephalus. CT CERVICAL SPINE FINDINGS No acute soft tissue abnormalities. Thyroid is normal. Lung apices are clear. Normal alignment. No fracture. IMPRESSION: Negative except for  scalp hematoma. Electronically Signed   By: Esperanza Heir M.D.   On: 09/13/2015 12:13   Ct Cervical Spine Wo Contrast  09/13/2015  CLINICAL DATA:  Larey Seat 8 feet off ladder hit back of head on cement EXAM: CT HEAD WITHOUT CONTRAST CT CERVICAL SPINE WITHOUT CONTRAST TECHNIQUE: Multidetector CT imaging of the head and cervical spine was performed following the standard protocol without intravenous contrast. Multiplanar CT image reconstructions of the cervical spine were also generated. COMPARISON:  None. FINDINGS: CT HEAD FINDINGS Mild posterior lateral left  scalp hematoma near the vertex. No skull fracture. No hemorrhagic contusion or extra-axial fluid. No mass, infarct, or hydrocephalus. CT CERVICAL SPINE FINDINGS No acute soft tissue abnormalities. Thyroid is normal. Lung apices are clear. Normal alignment. No fracture. IMPRESSION: Negative except for scalp hematoma. Electronically Signed   By: Esperanza Heir M.D.   On: 09/13/2015 12:13   I have personally reviewed and evaluated these images and lab results as part of my medical decision-making.   EKG Interpretation None      LACERATION REPAIR Performed by: Emi Holes Authorized by: Emi Holes Consent: Verbal consent obtained. Risks and benefits: risks, benefits and alternatives were discussed Consent given by: patient Patient identity confirmed: provided demographic data Prepped and Draped in normal sterile fashion Wound explored  Laceration Location: posterior scalp  Laceration Length: 3 cm  No Foreign Bodies seen or palpated  Anesthesia: local infiltration  Local anesthetic: lidocaine 1% w/ epinephrine  Anesthetic total: 2 ml  Irrigation method: syringe Amount of cleaning: standard  Skin closure: well approximated  Number of staples: 4  Technique: staples  Patient tolerance: Patient tolerated the procedure well with no immediate complications.   MDM   Scalp laceration repaired with staples and patient made aware to have staples removed in 7-10 days. CT head and C-spine are negative except for scalp hematoma. X-ray of right ankle shows soft tissue swelling, but no osseous fracture or dislocation. X-ray of left elbow shows significantly displaced/comminuted fracture of the olecranon. Patient also evaluated by Dr. Linwood Dibbles, who consulted orthopedic surgery who would like to take the patient to the OR and admit.  Final diagnoses:  Olecranon fracture, left, open type I or II, initial encounter  Scalp laceration, initial encounter  Contusion, ankle, right,  initial encounter       Emi Holes, PA-C 09/13/15 [address] Reynold Mantell, PA-C 09/13/15 1848  Linwood Dibbles, MD 09/13/15 2039

## 2015-09-13 NOTE — Anesthesia Postprocedure Evaluation (Signed)
Anesthesia Post Note  Patient: Jacob Benson  Procedure(s) Performed: Procedure(s) (LRB): OPEN REDUCTION INTERNAL FIXATION (ORIF) ELBOW/OLECRANON AND I AND D (Left) IRRIGATION AND DEBRIDEMENT EXTREMITY (Left)  Patient location during evaluation: PACU Anesthesia Type: Regional Level of consciousness: awake and alert and patient cooperative Pain management: pain level controlled Vital Signs Assessment: post-procedure vital signs reviewed and stable Respiratory status: spontaneous breathing and respiratory function stable Cardiovascular status: stable Anesthetic complications: no    Last Vitals:  Filed Vitals:   09/13/15 1515 09/13/15 1833  BP: 104/71 119/63  Pulse: 64 78  Temp:    Resp: 16 15    Last Pain:  Filed Vitals:   09/13/15 1836  PainSc: 8                  Kohana Amble DAVID

## 2015-09-13 NOTE — Op Note (Signed)
Date of Surgery: 09/13/2015  INDICATIONS: Mr. Orson SlickBowman is a 30 y.o.-year-old male with a left open olecranon fracture;  The patient did consent to the procedure after discussion of the risks and benefits.  PREOPERATIVE DIAGNOSIS: Left type 1 open olecranon fracture  POSTOPERATIVE DIAGNOSIS: Same.  PROCEDURE:  1. Open treatment internal fixation of left open olecranon fracture 2. Irrigation and debridement of bone, muscle, skin associated with open olecranon fracture 3. Adjacent tissue rearrangement of left elbow 3 x 3 cm  SURGEON: N. Glee ArvinMichael Jariah Jarmon, M.D.  ASSIST: none.  ANESTHESIA:  Regional and conscious sedation  IV FLUIDS AND URINE: See anesthesia.  ESTIMATED BLOOD LOSS: minimal mL.  IMPLANTS: Biomet  DRAINS: none  COMPLICATIONS: None.  DESCRIPTION OF PROCEDURE: The patient was brought to the operating room and placed supine on the operating table.  The patient had been signed prior to the procedure and this was documented. The patient had the anesthesia placed by the anesthesiologist.  A time-out was performed to confirm that this was the correct patient, site, side and location. The patient did receive antibiotics prior to the incision and was re-dosed during the procedure as needed at indicated intervals.  A tourniquet was placed and inflated to 250 mmHg..  The patient had the operative extremity prepped and draped in the standard surgical fashion.    A standard posterior longitudinal incision was created. Sharp excisional debridement of the traumatic skin was performed with a knife. We then perform blunt dissection to the fascia. The fascia was sharply incised. The fracture was exposed and was severely comminuted. 6 L of normal saline was irrigated. Sharp excisional debridement of the bone and muscle was performed using a rongeur. There was no gross contamination. Once we had adequate debridement and irrigation we turned our attention to fixation of the fracture. We first placed a  2.7 mm interfrag screw to hold a fragment of the shaft to the proximal ulnar shaft. We then thoroughly irrigated the joint. There is one small articular segment that had no soft tissue attachments and I was unable to determine how the articular piece would fit back therefore the decision was made to leave the piece out. I then performed a reduction with the remaining fracture fragments in order to reduce the joint as well as I could. This was provisionally fixed with a large tenaculum clamp. We then placed a K wire through the longitudinal axis of the proximal ulna. The appropriate length of the plate was then placed on the proximal ulna. We placed a nonlocking screw through a compression hole in order to gain compression across the fracture. This also contoured the plate down to the bone. We then placed 2 locking screws through the proximal fracture fragment. The K wire was removed. I then placed 2 homerun screws through the proximal end of the plate. I then placed 2 more nonlocking screws through the shaft. X-rays were used to confirm appropriate articular reduction. Final x-rays were taken. The wound was then thoroughly irrigated. Hemostasis was obtained. The wound was closed in a layer fashion using 0 Vicryl for the fascia, 2-0 Vicryl for the deep skin layer and 3-0 nylon for the skin. I had to perform adjacent tissue rearrangement in order to close the traumatic wound measuring 3 x 3 cm. Sterile dressings were applied. The elbow was immobilized in a long-arm splint at 90. Patient tolerated procedure well and had no immediate complications.  POSTOPERATIVE PLAN: The patient will be admitted overnight for intravenous antibiotics. He will be  discharged in the morning.  Mayra Reel, MD [facility] [phone] 6:35 PM

## 2015-09-13 NOTE — ED Notes (Signed)
Pt transported to xray 

## 2015-09-13 NOTE — ED Provider Notes (Addendum)
Medical screening examination/treatment/procedure(s) were conducted as a shared visit with non-physician practitioner(s) and myself.  I personally evaluated the patient during the encounter.  Pt presents to the ED after a fall off of a ladder.  Has a scalp lac as well as an elbow lac on the left.  CT scans are negative.  Plain films of elbow and ankle are pending.  Xray shows comminuted fx of elbow.  1 cm laceration noted. Consistent with open fracture.  Will give ancef, consult with orthopedic surgery.  Linwood DibblesJon Tishawn Friedhoff, MD 09/13/15 1247  D/w Dr Roda ShuttersXu.  Will see the patient.  Keep him NPO for the OR.  Linwood DibblesJon Zulma Court, MD 09/13/15 [phone]

## 2015-09-13 NOTE — Anesthesia Procedure Notes (Signed)
Anesthesia Regional Block:  Supraclavicular block  Pre-Anesthetic Checklist: ,, timeout performed, Correct Patient, Correct Site, Correct Laterality, Correct Procedure, Correct Position, site marked, Risks and benefits discussed,  Surgical consent,  Pre-op evaluation,  At surgeon's request and post-op pain management  Laterality: Left  Prep: chloraprep       Needles:  Injection technique: Single-shot  Needle Type: Echogenic Stimulator Needle     Needle Length: 9cm 9 cm Needle Gauge: 21 and 21 G    Additional Needles:  Procedures: ultrasound guided (picture in chart) and nerve stimulator Supraclavicular block  Nerve Stimulator or Paresthesia:  Response: 0.4 mA,   Additional Responses:   Narrative:  Start time: 09/13/2015 3:55 PM End time: 09/13/2015 4:05 PM Injection made incrementally with aspirations every 5 mL.  Performed by: Personally  Anesthesiologist: Arta BruceSSEY, Jacen Carlini  Additional Notes: Monitors applied. Patient sedated. Sterile prep and drape,hand hygiene and sterile gloves were used. Relevant anatomy identified.Needle position confirmed.Local anesthetic injected incrementally after negative aspiration. Local anesthetic spread visualized around nerve(s). Vascular puncture avoided. No complications. Image printed for medical record.The patient tolerated the procedure well.

## 2015-09-13 NOTE — ED Notes (Signed)
Ortho at bedside.

## 2015-09-14 DIAGNOSIS — Z87891 Personal history of nicotine dependence: Secondary | ICD-10-CM | POA: Diagnosis not present

## 2015-09-14 DIAGNOSIS — S52022A Displaced fracture of olecranon process without intraarticular extension of left ulna, initial encounter for closed fracture: Secondary | ICD-10-CM | POA: Diagnosis not present

## 2015-09-14 DIAGNOSIS — E785 Hyperlipidemia, unspecified: Secondary | ICD-10-CM | POA: Diagnosis not present

## 2015-09-14 DIAGNOSIS — I10 Essential (primary) hypertension: Secondary | ICD-10-CM | POA: Diagnosis not present

## 2015-09-14 NOTE — Discharge Summary (Signed)
Physician Discharge Summary      Patient ID: ECHO ALLSBROOK MRN: [id number] DOB/AGE: 29/01/1986 29 y.o.  Admit date: 09/13/2015 Discharge date: 09/14/2015  Admission Diagnoses:  <principal problem not specified>  Discharge Diagnoses:  Active Problems:   S/P ORIF (open reduction internal fixation) fracture   Past Medical History  Diagnosis Date  . Hypertension   . Hyperlipidemia   . Calcium kidney stones 2007    Surgeries: Procedure(s): OPEN REDUCTION INTERNAL FIXATION (ORIF) ELBOW/OLECRANON AND I AND D IRRIGATION AND DEBRIDEMENT EXTREMITY on 09/13/2015   Consultants (if any):    Discharged Condition: Improved  Hospital Course: HARVEL MESKILL is an 30 y.o. male who was admitted 09/13/2015 with a diagnosis of <principal problem not specified> and went to the operating room on 09/13/2015 and underwent the above named procedures.    He was given perioperative antibiotics:  Anti-infectives    Start     Dose/Rate Route Frequency Ordered Stop   09/13/15 2200  ceFAZolin (ANCEF) IVPB 2g/100 mL premix     2 g 200 mL/hr over 30 Minutes Intravenous Every 6 hours 09/13/15 2105 09/14/15 1559   09/13/15 1815  ceFAZolin (ANCEF) IVPB 2g/100 mL premix  Status:  Discontinued     2 g 200 mL/hr over 30 Minutes Intravenous On call to O.R. 09/13/15 1543 09/13/15 1928   09/13/15 1546  ceFAZolin (ANCEF) 1-5 GM-% IVPB    Comments:  Shireen Quan   : cabinet override      09/13/15 1546 09/13/15 1630   09/13/15 1430  ceFAZolin (ANCEF) IVPB 1 g/50 mL premix     1 g 100 mL/hr over 30 Minutes Intravenous  Once 09/13/15 1420 09/13/15 1517   09/13/15 1300  ceFAZolin (ANCEF) IVPB 1 g/50 mL premix     1 g 100 mL/hr over 30 Minutes Intravenous  Once 09/13/15 1246 09/13/15 1523    .  He was given sequential compression devices, early ambulation for DVT prophylaxis.  He benefited maximally from the hospital stay and there were no complications.    Recent vital signs:  Filed Vitals:   09/14/15  0035 09/14/15 0424  BP: 100/59 111/62  Pulse: 62 71  Temp: 97.9 F (36.6 C) 98.3 F (36.8 C)  Resp: 16 16    Recent laboratory studies:  Lab Results  Component Value Date   HGB 15.6 04/19/2014   HGB 15.3 03/22/2014   Lab Results  Component Value Date   WBC 5.3 08/20/2015   PLT 266 08/20/2015   No results found for: INR Lab Results  Component Value Date   NA 137 08/20/2015   K 4.0 08/20/2015   CL 96 08/20/2015   CO2 26 08/20/2015   BUN 18 08/20/2015   CREATININE 1.20 08/20/2015   GLUCOSE 82 08/20/2015    Discharge Medications:     Medication List    TAKE these medications        ALEVE 220 MG tablet  Generic drug:  naproxen sodium  Take 440 mg by mouth daily.     amLODipine 5 MG tablet  Commonly known as:  NORVASC  Take 1 tablet (5 mg total) by mouth daily.     azelastine 0.1 % nasal spray  Commonly known as:  ASTELIN  Place 2 sprays into both nostrils at bedtime. Use in each nostril as directed     hydrochlorothiazide 12.5 MG capsule  Commonly known as:  MICROZIDE  Take 1 capsule (12.5 mg total) by mouth daily.     methocarbamol 750  MG tablet  Commonly known as:  ROBAXIN  Take 1 tablet (750 mg total) by mouth 2 (two) times daily as needed for muscle spasms.     ondansetron 4 MG tablet  Commonly known as:  ZOFRAN  Take 1-2 tablets (4-8 mg total) by mouth every 8 (eight) hours as needed for nausea or vomiting.     oxyCODONE 5 MG immediate release tablet  Commonly known as:  Oxy IR/ROXICODONE  Take 1-3 tablets (5-15 mg total) by mouth every 4 (four) hours as needed.     oxyCODONE 10 mg 12 hr tablet  Commonly known as:  OXYCONTIN  Take 1 tablet (10 mg total) by mouth every 12 (twelve) hours.     senna-docusate 8.6-50 MG tablet  Commonly known as:  SENOKOT S  Take 1 tablet by mouth at bedtime as needed.        Diagnostic Studies: Dg Elbow 2 Views Left  09/13/2015  CLINICAL DATA:  ORIF left olecranon fracture EXAM: LEFT ELBOW - 2 VIEW; DG C-ARM  61-120 MIN COMPARISON:  Left elbow radiographs from earlier today FINDINGS: Fluoroscopy time 0 minutes and 28 seconds. Two nondiagnostic spot fluoroscopic intraoperative radiographs demonstrate transfixation of the comminuted left olecranon fracture with surgical plate and multiple interlocking screws. IMPRESSION: Intraoperative fluoroscopic guidance for ORIF left olecranon fracture. Electronically Signed   By: Delbert Phenix M.D.   On: 09/13/2015 18:36   Dg Elbow Complete Left  09/13/2015  CLINICAL DATA:  Fall from 10 feet, severe left oval pain. EXAM: LEFT ELBOW - COMPLETE 3+ VIEW COMPARISON:  None. FINDINGS: Significantly displaced/ comminuted fracture of the olecranon. The proximal fracture fragment remains grossly well positioned relative to the distal humerus. No discrete fracture seen within the distal humerus or proximal radius. Joint effusion is present. IMPRESSION: 1. Significantly displaced/comminuted fractures of the olecranon. Proximal fracture fragment remains grossly well positioned relative to the distal humerus. 2. No fracture seen within the humerus or proximal radius. 3. Joint effusion. Electronically Signed   By: Bary Richard M.D.   On: 09/13/2015 12:37   Dg Ankle Complete Right  09/13/2015  CLINICAL DATA:  Fall from 10 feet, right ankle pain. Medial soft tissue swelling. EXAM: RIGHT ANKLE - COMPLETE 3+ VIEW COMPARISON:  None. FINDINGS: Osseous alignment is normal. Bone mineralization is normal. No fracture line or displaced fracture fragment seen. Ankle mortise is symmetric. Visualized portions of the hindfoot and midfoot appear intact and normally aligned. Prominent soft tissue swelling overlying the medial malleolus. IMPRESSION: Soft tissue swelling.  No osseous fracture or dislocation seen. Electronically Signed   By: Bary Richard M.D.   On: 09/13/2015 12:38   Ct Head Wo Contrast  09/13/2015  CLINICAL DATA:  Larey Seat 8 feet off ladder hit back of head on cement EXAM: CT HEAD WITHOUT  CONTRAST CT CERVICAL SPINE WITHOUT CONTRAST TECHNIQUE: Multidetector CT imaging of the head and cervical spine was performed following the standard protocol without intravenous contrast. Multiplanar CT image reconstructions of the cervical spine were also generated. COMPARISON:  None. FINDINGS: CT HEAD FINDINGS Mild posterior lateral left scalp hematoma near the vertex. No skull fracture. No hemorrhagic contusion or extra-axial fluid. No mass, infarct, or hydrocephalus. CT CERVICAL SPINE FINDINGS No acute soft tissue abnormalities. Thyroid is normal. Lung apices are clear. Normal alignment. No fracture. IMPRESSION: Negative except for scalp hematoma. Electronically Signed   By: Esperanza Heir M.D.   On: 09/13/2015 12:13   Ct Cervical Spine Wo Contrast  09/13/2015  CLINICAL DATA:  Larey Seat  8 feet off ladder hit back of head on cement EXAM: CT HEAD WITHOUT CONTRAST CT CERVICAL SPINE WITHOUT CONTRAST TECHNIQUE: Multidetector CT imaging of the head and cervical spine was performed following the standard protocol without intravenous contrast. Multiplanar CT image reconstructions of the cervical spine were also generated. COMPARISON:  None. FINDINGS: CT HEAD FINDINGS Mild posterior lateral left scalp hematoma near the vertex. No skull fracture. No hemorrhagic contusion or extra-axial fluid. No mass, infarct, or hydrocephalus. CT CERVICAL SPINE FINDINGS No acute soft tissue abnormalities. Thyroid is normal. Lung apices are clear. Normal alignment. No fracture. IMPRESSION: Negative except for scalp hematoma. Electronically Signed   By: Esperanza Heiraymond  Rubner M.D.   On: 09/13/2015 12:13   Dg C-arm 1-60 Min  09/13/2015  CLINICAL DATA:  ORIF left olecranon fracture EXAM: LEFT ELBOW - 2 VIEW; DG C-ARM 61-120 MIN COMPARISON:  Left elbow radiographs from earlier today FINDINGS: Fluoroscopy time 0 minutes and 28 seconds. Two nondiagnostic spot fluoroscopic intraoperative radiographs demonstrate transfixation of the comminuted left  olecranon fracture with surgical plate and multiple interlocking screws. IMPRESSION: Intraoperative fluoroscopic guidance for ORIF left olecranon fracture. Electronically Signed   By: Delbert PhenixJason A Poff M.D.   On: 09/13/2015 18:36    Disposition:       Discharge Instructions    Call MD / Call 911    Complete by:  As directed   If you experience chest pain or shortness of breath, CALL 911 and be transported to the hospital emergency room.  If you develope a fever above 101.5 F, pus (white drainage) or increased drainage or redness at the wound, or calf pain, call your surgeon's office.     Constipation Prevention    Complete by:  As directed   Drink plenty of fluids.  Prune juice may be helpful.  You may use a stool softener, such as Colace (over the counter) 100 mg twice a day.  Use MiraLax (over the counter) for constipation as needed.     Diet - low sodium heart healthy    Complete by:  As directed      Diet general    Complete by:  As directed      Driving restrictions    Complete by:  As directed   No driving while taking narcotic pain meds.     Increase activity slowly as tolerated    Complete by:  As directed            Follow-up Information    Follow up with Cheral AlmasXu, Naiping Michael, MD In 2 weeks.   Specialty:  Orthopedic Surgery   Why:  For suture removal, For wound re-check   Contact information:   [address] [place] [id number] [phone]        Signed: Cheral AlmasXu, Naiping Michael 09/14/2015, 8:20 AM

## 2015-09-14 NOTE — Progress Notes (Signed)
   Subjective:  Patient reports pain as mild.    Objective:   VITALS:   Filed Vitals:   09/13/15 1902 09/13/15 2005 09/14/15 0035 09/14/15 0424  BP: 116/77 126/74 100/59 111/62  Pulse: 63 73 62 71  Temp: 98.3 F (36.8 C) 98.6 F (37 C) 97.9 F (36.6 C) 98.3 F (36.8 C)  TempSrc:  Oral Oral Oral  Resp: 12 16 16 16   Height:      Weight:      SpO2: 100% 100% 99% 98%    Neurologically intact Neurovascular intact Sensation intact distally Intact pulses distally No cellulitis present Compartment soft   Lab Results  Component Value Date   WBC 5.3 08/20/2015   HGB 15.6 04/19/2014   HCT 44.6 08/20/2015   MCV 87 08/20/2015   PLT 266 08/20/2015     Assessment/Plan:  1 Day Post-Op   - cleared OT - DVT ppx - SCDs, ambulation - NWB operative extremity - Pain control - Discharge planning - home today  Cheral AlmasXu, Jasraj Lappe Michael 09/14/2015, 8:19 AM [phone]

## 2015-09-14 NOTE — Evaluation (Signed)
Occupational Therapy Evaluation Patient Details Name: Jacob Benson MRN: [id number] DOB: 18-Dec-1985 Today's Date: 09/14/2015    History of Present Illness Pt. admitted for L elbow fx with ORIF performed. Pt. with PMH: HTN, kidney stones, and previous fx.  (Hypertension)   Clinical Impression   Pt. And mother were educated on importance of keeping L UE elevated for edema management and pillows placed under L UE to bring to elevate. Pt. And mother were educated on donning and doffing of sling and proper position of sling. Pt. And mother was educated on wrapping L UE in plastic prior to shower to protect splint and surgical sight. Pt. Was ed to wear lose clothing for UE to make dressing less difficult. Pt. Was ed to perform ROM of L SHLD to prevent frozen SHLD. Pt. Was ed to move fingers for edema management and for ROM. Pt. Has  Ankle [place] but refused CAM boot. Pt. States his ankle is fine. Pt. Refused to stand with therapist secondary not having clothes here. Pt. States that he will be fine to walk. Pt. Was ed to avoid weight bearing on L UE until cleared by MD.     Follow Up Recommendations       Equipment Recommendations       Recommendations for Other Services       Precautions / Restrictions Precautions Precautions: None Required Braces or Orthoses: Sling;Other Brace/Splint Other Brace/Splint:  (L elbow) Restrictions Weight Bearing Restrictions: Yes LUE Weight Bearing: Non weight bearing      Mobility Bed Mobility                  Transfers                      Balance                                            ADL Overall ADL's : At baseline                                       General ADL Comments: Pt. ed on UE bathing and dressing with L elbow brace.      Vision     Perception     Praxis      Pertinent Vitals/Pain Pain Assessment: 0-10 Pain Score: 8  Pain Location:  (L elbow) Pain Descriptors  / Indicators: Aching;Constant Pain Intervention(s): Monitored during session;Premedicated before session     Hand Dominance Right   Extremity/Trunk Assessment Upper Extremity Assessment Upper Extremity Assessment: LUE deficits/detail LUE Deficits / Details: L elbow braced at 90 degrees LUE: Unable to fully assess due to immobilization           Communication Communication Communication: No difficulties   Cognition Arousal/Alertness: Awake/alert Behavior During Therapy: WFL for tasks assessed/performed Overall Cognitive Status: Within Functional Limits for tasks assessed                     General Comments       Exercises       Shoulder Instructions      Home Living Family/patient expects to be discharged to:: Private residence Living Arrangements: Spouse/significant other Available Help at Discharge: Family;Available 24 hours/day Type of Home: House Home Access: Level entry  Bathroom Shower/Tub: Doctor, general practiceTub/shower unit   Bathroom Toilet: Standard     Home Equipment: None          Prior Functioning/Environment Level of Independence: Independent             OT Diagnosis: Acute pain   OT Problem List: Pain   OT Treatment/Interventions:      OT Goals(Current goals can be found in the care plan section) Acute Rehab OT Goals Patient Stated Goal: go home OT Goal Formulation: With patient  OT Frequency:     Barriers to D/C:            Co-evaluation              End of Session    Activity Tolerance: Patient tolerated treatment well Patient left: in bed;with call bell/phone within reach;with family/visitor present   Time: 0757-0827 OT Time Calculation (min): 30 min Charges:  OT General Charges $OT Visit: 1 Procedure OT Evaluation $OT Eval Low Complexity: 1 Procedure OT Treatments $Self Care/Home Management : 8-22 mins G-Codes: OT G-codes **NOT FOR INPATIENT CLASS** Functional Limitation: Self care Self Care Current  Status (A2130(G8987): At least 1 percent but less than 20 percent impaired, limited or restricted Self Care Goal Status (Q6578(G8988): At least 1 percent but less than 20 percent impaired, limited or restricted Self Care Discharge Status [phone](G8989): At least 1 percent but less than 20 percent impaired, limited or restricted  Daryll Spisak 09/14/2015, 8:29 AM

## 2015-09-14 NOTE — Progress Notes (Signed)
Orthopedic Tech Progress Note Patient Details:  Jacob FasterBrandon M Benson 01-Oct-1985 [id number]  Ortho Devices Type of Ortho Device: CAM walker Ortho Device/Splint Location: rle Ortho Device/Splint Interventions: Application   Jacob Benson 09/14/2015, 7:03 AM

## 2015-09-14 NOTE — Progress Notes (Signed)
Discharge instructions gone over with patient. Prescriptions given. Home medications discussed. Follow up appointment to be made. Diet, activity, and incisional care gone over. Reasons to call the doctor and weight bearing discussed. Signs and symptoms of a worsening condition discussed. Bowel regimen discussed. Patient verbalized understanding of instructions and asked questions appropriately.

## 2015-09-15 ENCOUNTER — Encounter (HOSPITAL_COMMUNITY): Payer: Self-pay | Admitting: Orthopaedic Surgery

## 2015-09-23 ENCOUNTER — Encounter: Payer: Self-pay | Admitting: Family Medicine

## 2015-09-23 ENCOUNTER — Ambulatory Visit (INDEPENDENT_AMBULATORY_CARE_PROVIDER_SITE_OTHER): Payer: 59 | Admitting: Family Medicine

## 2015-09-23 VITALS — BP 140/76 | HR 61 | Temp 97.0°F | Ht 70.0 in | Wt 182.0 lb

## 2015-09-23 DIAGNOSIS — Z8781 Personal history of (healed) traumatic fracture: Secondary | ICD-10-CM | POA: Diagnosis not present

## 2015-09-23 DIAGNOSIS — Z4802 Encounter for removal of sutures: Secondary | ICD-10-CM | POA: Diagnosis not present

## 2015-09-23 DIAGNOSIS — S0101XD Laceration without foreign body of scalp, subsequent encounter: Secondary | ICD-10-CM

## 2015-09-23 DIAGNOSIS — S0101XA Laceration without foreign body of scalp, initial encounter: Secondary | ICD-10-CM | POA: Diagnosis not present

## 2015-09-23 DIAGNOSIS — Z967 Presence of other bone and tendon implants: Secondary | ICD-10-CM | POA: Diagnosis not present

## 2015-09-23 DIAGNOSIS — Z9889 Other specified postprocedural states: Secondary | ICD-10-CM

## 2015-09-23 NOTE — Progress Notes (Signed)
Subjective:    Patient ID: Jacob Benson, male    DOB: 1986-05-03, 30 y.o.   MRN: [id number]  HPI Pt here for staple removal of head. He fell off a ladder 09/13/15. He's had an open reduction and internal fixation of his left elbow by Dr. Roda Shutters. He also had a scalp laceration and staples for this.    Patient Active Problem List   Diagnosis Date Noted  . S/P ORIF (open reduction internal fixation) fracture 09/13/2015  . HTN (hypertension) 10/17/2012   Outpatient Encounter Prescriptions as of 09/23/2015  Medication Sig  . amLODipine (NORVASC) 5 MG tablet Take 1 tablet (5 mg total) by mouth daily.  Marland Kitchen azelastine (ASTELIN) 0.1 % nasal spray Place 2 sprays into both nostrils at bedtime. Use in each nostril as directed  . hydrochlorothiazide (MICROZIDE) 12.5 MG capsule Take 1 capsule (12.5 mg total) by mouth daily.  . methocarbamol (ROBAXIN) 750 MG tablet Take 1 tablet (750 mg total) by mouth 2 (two) times daily as needed for muscle spasms.  . naproxen sodium (ALEVE) 220 MG tablet Take 440 mg by mouth daily.  . ondansetron (ZOFRAN) 4 MG tablet Take 1-2 tablets (4-8 mg total) by mouth every 8 (eight) hours as needed for nausea or vomiting.  Marland Kitchen oxyCODONE (OXY IR/ROXICODONE) 5 MG immediate release tablet Take 1-3 tablets (5-15 mg total) by mouth every 4 (four) hours as needed.  . senna-docusate (SENOKOT S) 8.6-50 MG tablet Take 1 tablet by mouth at bedtime as needed.  . [DISCONTINUED] oxyCODONE (OXYCONTIN) 10 mg 12 hr tablet Take 1 tablet (10 mg total) by mouth every 12 (twelve) hours.   No facility-administered encounter medications on file as of 09/23/2015.      Review of Systems  Constitutional: Negative.   HENT: Negative.   Eyes: Negative.   Respiratory: Negative.   Cardiovascular: Negative.   Gastrointestinal: Negative.   Endocrine: Negative.   Genitourinary: Negative.   Musculoskeletal: Negative.        Injury left arm - Dr Roda Shutters.  Skin: Negative.        Healed head lac    Allergic/Immunologic: Negative.   Neurological: Negative.   Hematological: Negative.   Psychiatric/Behavioral: Negative.        Objective:   Physical Exam  Constitutional: He is oriented to person, place, and time. He appears well-developed and well-nourished.  HENT:  Head: Normocephalic.  4 staples occipital scalp removed without problems  Eyes: Conjunctivae and EOM are normal. Pupils are equal, round, and reactive to light. Right eye exhibits no discharge. Left eye exhibits no discharge. No scleral icterus.  Neck: Normal range of motion.  Musculoskeletal: Normal range of motion.  Neurological: He is alert and oriented to person, place, and time.  Skin: Skin is warm and dry.  Psychiatric: He has a normal mood and affect. His behavior is normal. Judgment and thought content normal.  Nursing note and vitals reviewed.  BP 140/76 mmHg  Pulse 61  Temp(Src) 97 F (36.1 C) (Oral)  Ht  (1.778 m)  Wt 182 lb (82.555 kg)  BMI 26.11 kg/m2 4 staples removed without problems and occipital scalp. Laceration appears to be healing well and there is no drainage.       Assessment & Plan:  1. Visit for suture removal -4 staples superior occipital scalp removed without problem  2. S/P ORIF (open reduction internal fixation) fracture -Continue to follow-up with orthopedist  3. Scalp laceration, subsequent encounter -Staples removed and laceration well healed  Patient  Instructions  Continue to follow-up with orthopedic surgeon   Nyra Capeson W. Moore MD

## 2015-09-23 NOTE — Patient Instructions (Signed)
Continue to follow-up with orthopedic surgeon

## 2015-09-26 DIAGNOSIS — S52022D Displaced fracture of olecranon process without intraarticular extension of left ulna, subsequent encounter for closed fracture with routine healing: Secondary | ICD-10-CM | POA: Diagnosis not present

## 2015-10-01 ENCOUNTER — Ambulatory Visit (INDEPENDENT_AMBULATORY_CARE_PROVIDER_SITE_OTHER): Payer: 59 | Admitting: Physician Assistant

## 2015-10-01 ENCOUNTER — Encounter: Payer: Self-pay | Admitting: Physician Assistant

## 2015-10-01 VITALS — BP 118/71 | HR 77 | Temp 98.4°F | Ht 70.0 in | Wt 182.6 lb

## 2015-10-01 DIAGNOSIS — H8113 Benign paroxysmal vertigo, bilateral: Secondary | ICD-10-CM

## 2015-10-01 NOTE — Progress Notes (Signed)
Subjective:     Patient ID: Jacob Benson, male   DOB: May 06, 1986, 30 y.o.   MRN: [id number]  HPI Pt with dizziness for ~ 1-2 weeks Sx are worse with change in position He had a fall recently and had open fx to the L arm This has been surgically repaired At the time he had head CT that was normal No hx of same prev He denies any weight loss/gain He has recently stopped smoking and dipping snuff There is a hx of HTN but no recent changes in meds  Review of Systems  HENT: Negative.   Respiratory: Negative.   Neurological: Positive for dizziness, weakness and light-headedness. Negative for seizures, syncope, facial asymmetry, speech difficulty, numbness and headaches.       Objective:   Physical Exam  Constitutional: He is oriented to person, place, and time. He appears well-developed and well-nourished.  HENT:  Right Ear: External ear normal.  Left Ear: External ear normal.  Mouth/Throat: Oropharynx is clear and moist.  TM's nl bilat  Eyes: EOM are normal. Pupils are equal, round, and reactive to light.  Neck: Neck supple.  Cardiovascular: Normal rate, regular rhythm, normal heart sounds and intact distal pulses.   No murmur heard. Nl heart sounds laying and sitting  Pulmonary/Chest: Breath sounds normal.  Lymphadenopathy:    He has no cervical adenopathy.  Neurological: He is alert and oriented to person, place, and time. No cranial nerve deficit. Coordination normal.  Skin: Skin is warm and dry.  Nursing note and vitals reviewed. Orthostatics per chart     Assessment:     1. Benign paroxysmal positional vertigo, bilateral        Plan:     CBC and CMP pending Will await these results If lab values nl may decrease his BP med given the change with his laying and standing values Do not want him doing any elevated work at this time Would also like for him to take it slow with change in position F/U pending labs

## 2015-10-01 NOTE — Patient Instructions (Signed)

## 2015-10-02 ENCOUNTER — Encounter: Payer: Self-pay | Admitting: Physical Therapy

## 2015-10-02 ENCOUNTER — Ambulatory Visit: Payer: 59 | Attending: Orthopaedic Surgery | Admitting: Physical Therapy

## 2015-10-02 DIAGNOSIS — M25622 Stiffness of left elbow, not elsewhere classified: Secondary | ICD-10-CM

## 2015-10-02 DIAGNOSIS — M25522 Pain in left elbow: Secondary | ICD-10-CM | POA: Diagnosis not present

## 2015-10-02 LAB — CBC WITH DIFFERENTIAL/PLATELET
BASOS ABS: 0 10*3/uL (ref 0.0–0.2)
Basos: 0 %
EOS (ABSOLUTE): 0.2 10*3/uL (ref 0.0–0.4)
Eos: 2 %
HEMOGLOBIN: 15.2 g/dL (ref 12.6–17.7)
Hematocrit: 44 % (ref 37.5–51.0)
IMMATURE GRANS (ABS): 0.1 10*3/uL (ref 0.0–0.1)
Immature Granulocytes: 1 %
LYMPHS: 20 %
Lymphocytes Absolute: 1.7 10*3/uL (ref 0.7–3.1)
MCH: 29.9 pg (ref 26.6–33.0)
MCHC: 34.5 g/dL (ref 31.5–35.7)
MCV: 87 fL (ref 79–97)
MONOCYTES: 6 %
Monocytes Absolute: 0.5 10*3/uL (ref 0.1–0.9)
NEUTROS ABS: 6 10*3/uL (ref 1.4–7.0)
Neutrophils: 71 %
Platelets: 354 10*3/uL (ref 150–379)
RBC: 5.08 x10E6/uL (ref 4.14–5.80)
RDW: 14 % (ref 12.3–15.4)
WBC: 8.4 10*3/uL (ref 3.4–10.8)

## 2015-10-02 LAB — CMP14+EGFR
A/G RATIO: 1.8 (ref 1.2–2.2)
ALBUMIN: 4.4 g/dL (ref 3.5–5.5)
ALK PHOS: 107 IU/L (ref 39–117)
ALT: 28 IU/L (ref 0–44)
AST: 21 IU/L (ref 0–40)
BILIRUBIN TOTAL: 0.3 mg/dL (ref 0.0–1.2)
BUN / CREAT RATIO: 18 (ref 9–20)
BUN: 18 mg/dL (ref 6–20)
CHLORIDE: 99 mmol/L (ref 96–106)
CO2: 28 mmol/L (ref 18–29)
Calcium: 9.7 mg/dL (ref 8.7–10.2)
Creatinine, Ser: 1 mg/dL (ref 0.76–1.27)
GFR calc non Af Amer: 101 mL/min/{1.73_m2} (ref >59)
GFR, EST AFRICAN AMERICAN: 117 mL/min/{1.73_m2} (ref >59)
GLOBULIN, TOTAL: 2.5 g/dL (ref 1.5–4.5)
GLUCOSE: 79 mg/dL (ref 65–99)
Potassium: 4.1 mmol/L (ref 3.5–5.2)
SODIUM: 143 mmol/L (ref 134–144)
TOTAL PROTEIN: 6.9 g/dL (ref 6.0–8.5)

## 2015-10-02 NOTE — Therapy (Signed)
[facility] [address] [place], [place], [address] Phone: [phone]   Fax:  [phone]  Physical Therapy Evaluation  Patient Details  Name: Jacob Benson MRN: [id number] Date of Birth: 1985/08/19 Referring Provider: Gershon MusselNaiping Xu, MD  Encounter Date: 10/02/2015      PT End of Session - 10/02/15 1023    Visit Number 1   Number of Visits 12   Date for PT Re-Evaluation 11/13/15   PT Start Time 1024   PT Stop Time 1104   PT Time Calculation (min) 40 min   Activity Tolerance Patient tolerated treatment well   Behavior During Therapy [facility] for tasks assessed/performed      Past Medical History  Diagnosis Date  . Hypertension   . Hyperlipidemia   . Calcium kidney stones 2007    Past Surgical History  Procedure Laterality Date  . Tonsillectomy    . Fracture surgery    . Orif humerus fracture Left 09/13/2015    Procedure: OPEN REDUCTION INTERNAL FIXATION (ORIF) ELBOW/OLECRANON AND I AND D;  Surgeon: Tarry KosNaiping M Xu, MD;  Location: [facility] OR;  Service: Orthopedics;  Laterality: Left;  . I&d extremity Left 09/13/2015    Procedure: IRRIGATION AND DEBRIDEMENT EXTREMITY;  Surgeon: Tarry KosNaiping M Xu, MD;  Location: [facility] OR;  Service: Orthopedics;  Laterality: Left;    There were no vitals filed for this visit.       Subjective Assessment - 10/02/15 1017    Subjective Patient had surgery 09/13/15 after falling off a ladder and fracturing his L olecranon. Patient has soft brace but MD told him to only wear it when he is out.   Pertinent History HTN   Patient Stated Goals get back motion and strength   Currently in Pain? No/denies  occasional shooting pain.            [facility] - 10/02/15 0001    Assessment   Medical Diagnosis s/p ORIF L olecranon   Referring Provider Gershon MusselNaiping Xu, MD   Onset Date/Surgical Date 09/13/15   Hand Dominance Left   Next MD Visit 10/24/15   Prior Therapy none   Precautions   Precaution Comments NO  AAROM, NO AROM, NO LIFTING   Balance Screen   Has the patient fallen in the past 6 months Yes   How many times? 1   Has the patient had a decrease in activity level because of a fear of falling?  No   Is the patient reluctant to leave their home because of a fear of falling?  No   Prior Function   Level of Independence Independent   Vocation Full time employment   Forensic psychologistVocation Requirements Installs gutters, windows   Observation/Other Assessments-Edema    Edema --  mild edema in distal forearm and proximal hand vs. R   ROM / Strength   AROM / PROM / Strength PROM   PROM   Overall PROM Comments  L shoulder WNL; supination 55; pron WNL   PROM Assessment Site Elbow;Wrist   Right/Left Elbow Left   Left Elbow Flexion 98   Left Elbow Extension 57   Right/Left Wrist Left   Left Wrist Extension 65 Degrees   Left Wrist Flexion 85 Degrees   Palpation   Palpation comment L foream extensors, triceps                   [facility] Adult PT Treatment/Exercise - 10/02/15 0001    Manual Therapy   Manual Therapy Edema management;Soft  tissue mobilization   Edema Management to L hand and forearm   Soft tissue mobilization to L biceps/triceps and forearm; scar massage                PT Education - 10/02/15 1111    Education provided Yes   Education Details scar massage and active wrist/forearm and shoulder ROM   Person(s) Educated Patient   Methods Explanation;Demonstration   Comprehension Verbalized understanding             PT Long Term Goals - 10/02/15 1126    PT LONG TERM GOAL #1   Title I with HEP   Time 4   Period Weeks   Status New   PT LONG TERM GOAL #2   Title full L elbow flexion and extension to restore function.   Time 4   Period Weeks   Status New   PT LONG TERM GOAL #3   Title 5/5 L elbow strength to allow RTW   Time 4   Period Weeks   Status New               Plan - 10/02/15 1113    Clinical Impression Statement Patient presents s/p L  olecranon ORIF on 09/13/15. He has a soft brace that only needs to be worn when he is out of the house. 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 He reports only intermittent pain.   Rehab Potential Excellent   PT Frequency 3x / week   PT Duration 4 weeks   PT Treatment/Interventions ADLs/Self Care Home Management;Cryotherapy;Electrical Stimulation;Therapeutic exercise;Patient/family education;Manual techniques;Vasopneumatic Device;Passive range of motion   PT Next Visit Plan PROM only to elbow (gravity assisted extension okay); STW to biceps/triceps and forearm; edema management as needed. Modalities for pain.   PT Home Exercise Plan scar massage, AROM wrist and hand.   Consulted and Agree with Plan of Care Patient      Patient will benefit from skilled therapeutic intervention in order to improve the following deficits and impairments:  Decreased range of motion, Pain, Increased edema, Decreased strength  Visit Diagnosis: Stiffness of left elbow, not elsewhere classified - Plan: PT plan of care cert/re-cert  Pain in left elbow - Plan: PT plan of care cert/re-cert     Problem List Patient Active Problem List   Diagnosis Date Noted  . S/P ORIF (open reduction internal fixation) fracture 09/13/2015  . HTN (hypertension) 10/17/2012   Solon Palm PT  10/02/2015, 11:33 AM  [facility] [address] [place], [place], [address] Phone: [phone]   Fax:  [phone]  Name: Jacob Benson MRN: [id number] Date of Birth: 27-Aug-1985

## 2015-10-06 ENCOUNTER — Encounter: Payer: 59 | Admitting: Physical Therapy

## 2015-10-07 ENCOUNTER — Ambulatory Visit: Payer: 59 | Admitting: Physical Therapy

## 2015-10-07 DIAGNOSIS — M25622 Stiffness of left elbow, not elsewhere classified: Secondary | ICD-10-CM | POA: Diagnosis not present

## 2015-10-07 DIAGNOSIS — M25522 Pain in left elbow: Secondary | ICD-10-CM

## 2015-10-07 NOTE — Therapy (Signed)
[facility] [place], [place], [address] Phone: [phone]   Fax:  [phone]  Physical Therapy Treatment  Patient Details  Name: SHIVAN HODES MRN: [id number] Date of Birth: Oct 12, 1985 Referring Provider: Frankey Shown, MD  Encounter Date: 10/07/2015      PT End of Session - 10/07/15 1119    Visit Number 2   Number of Visits 12   Date for PT Re-Evaluation 11/13/15   PT Start Time 1117   PT Stop Time 1213   PT Time Calculation (min) 56 min   Activity Tolerance Patient tolerated treatment well   Behavior During Therapy [facility] for tasks assessed/performed      Past Medical History  Diagnosis Date  . Hypertension   . Hyperlipidemia   . Calcium kidney stones 2007    Past Surgical History  Procedure Laterality Date  . Tonsillectomy    . Fracture surgery    . Orif humerus fracture Left 09/13/2015    Procedure: OPEN REDUCTION INTERNAL FIXATION (ORIF) ELBOW/OLECRANON AND I AND D;  Surgeon: Leandrew Koyanagi, MD;  Location: [place];  Service: Orthopedics;  Laterality: Left;  . I&d extremity Left 09/13/2015    Procedure: IRRIGATION AND DEBRIDEMENT EXTREMITY;  Surgeon: Leandrew Koyanagi, MD;  Location: [place];  Service: Orthopedics;  Laterality: Left;    There were no vitals filed for this visit.      Subjective Assessment - 10/07/15 1119    Subjective Off pain meds for 4-5 days to see if that was causing the dizziness. Reports increased ache in elbow and pain in UT due to sleeping position.   Pertinent History HTN   Patient Stated Goals get back motion and strength   Currently in Pain? Yes   Pain Score 3    Pain Location Elbow   Pain Orientation Left   Pain Descriptors / Indicators Aching;Throbbing   Pain Onset 1 to 4 weeks ago   Pain Frequency Intermittent   Aggravating Factors  weather                         [facility] Adult PT Treatment/Exercise - 10/07/15 0001    Modalities   Modalities Electrical  Stimulation   Electrical Stimulation   Electrical Stimulation Location IFC to L elbow/biceps/triceps x 15 min 80-150 Hz to tolerance   Electrical Stimulation Goals Pain;Tone   Manual Therapy   Manual Therapy Soft tissue mobilization;Passive ROM   Soft tissue mobilization to L biceps, triceps, forearm   Passive ROM to L elbow with low load long duration stretches; also pronation/supination                     PT Long Term Goals - 10/02/15 1126    PT LONG TERM GOAL #1   Title I with HEP   Time 4   Period Weeks   Status New   PT LONG TERM GOAL #2   Title full L elbow flexion and extension to restore function.   Time 4   Period Weeks   Status New   PT LONG TERM GOAL #3   Title 5/5 L elbow strength to allow RTW   Time 4   Period Weeks   Status New               Plan - 10/07/15 1202    Clinical Impression Statement Patiient tolerated treatment well today. He has increased pain due to going off pain meds. He  has increased tone in biceps and just soreness in triceps and forearm today. No goals met as only second visit.   Rehab Potential Excellent   PT Frequency 3x / week   PT Duration 4 weeks   PT Treatment/Interventions ADLs/Self Care Home Management;Cryotherapy;Electrical Stimulation;Therapeutic exercise;Patient/family education;Manual techniques;Vasopneumatic Device;Passive range of motion   PT Next Visit Plan PROM only to elbow (gravity assisted extension okay); STW to biceps/triceps and forearm; edema management as needed. Modalities for pain.      Patient will benefit from skilled therapeutic intervention in order to improve the following deficits and impairments:  Decreased range of motion, Pain, Increased edema, Decreased strength  Visit Diagnosis: Stiffness of left elbow, not elsewhere classified  Pain in left elbow     Problem List Patient Active Problem List   Diagnosis Date Noted  . S/P ORIF (open reduction internal fixation) fracture  09/13/2015  . HTN (hypertension) 10/17/2012    Madelyn Flavors PT  10/07/2015, 12:06 PM  [facility] [address] [place], [place], [address] Phone: [phone]   Fax:  [phone]  Name: SHELDEN RABORN MRN: [id number] Date of Birth: 1985-08-27

## 2015-10-08 ENCOUNTER — Other Ambulatory Visit: Payer: Self-pay

## 2015-10-08 ENCOUNTER — Encounter: Payer: Self-pay | Admitting: Physical Therapy

## 2015-10-08 ENCOUNTER — Ambulatory Visit: Payer: 59 | Admitting: Physical Therapy

## 2015-10-08 DIAGNOSIS — M25522 Pain in left elbow: Secondary | ICD-10-CM | POA: Diagnosis not present

## 2015-10-08 DIAGNOSIS — M25622 Stiffness of left elbow, not elsewhere classified: Secondary | ICD-10-CM | POA: Diagnosis not present

## 2015-10-08 MED ORDER — MECLIZINE HCL 25 MG PO TABS: 25.0000 mg | ORAL_TABLET | Freq: Three times a day (TID) | ORAL | Status: DC | PRN

## 2015-10-08 NOTE — Therapy (Signed)
[facility] [address] [place], [place], [address] Phone: [phone]   Fax:  [phone]  Physical Therapy Treatment  Patient Details  Name: Jacob Benson MRN: [id number] Date of Birth: Sep 28, 1985 Referring Provider: Gershon MusselNaiping Xu, MD  Encounter Date: 10/08/2015      PT End of Session - 10/08/15 1115    Visit Number 3   Number of Visits 12   Date for PT Re-Evaluation 11/13/15   PT Start Time 1118   PT Stop Time 1204   PT Time Calculation (min) 46 min   Activity Tolerance Patient tolerated treatment well   Behavior During Therapy [facility] for tasks assessed/performed      Past Medical History  Diagnosis Date  . Hypertension   . Hyperlipidemia   . Calcium kidney stones 2007    Past Surgical History  Procedure Laterality Date  . Tonsillectomy    . Fracture surgery    . Orif humerus fracture Left 09/13/2015    Procedure: OPEN REDUCTION INTERNAL FIXATION (ORIF) ELBOW/OLECRANON AND I AND D;  Surgeon: Tarry KosNaiping M Xu, MD;  Location: [facility];  Service: Orthopedics;  Laterality: Left;  . I&d extremity Left 09/13/2015    Procedure: IRRIGATION AND DEBRIDEMENT EXTREMITY;  Surgeon: Tarry KosNaiping M Xu, MD;  Location: [facility];  Service: Orthopedics;  Laterality: Left;    There were no vitals filed for this visit.      Subjective Assessment - 10/08/15 1115    Subjective Asked whether he could actively more L elbow while at home. Reports no relief from dizziness symptoms at this time.   Pertinent History HTN   Patient Stated Goals get back motion and strength   Currently in Pain? No/denies            [facility] - 10/08/15 0001    Assessment   Medical Diagnosis s/p ORIF L olecranon   Onset Date/Surgical Date 09/13/15   Hand Dominance Left   Next MD Visit 10/24/15   Prior Therapy none   Precautions   Precaution Comments NO AAROM, NO AROM, NO LIFTING                     [facility] Adult PT Treatment/Exercise - 10/08/15 0001     Modalities   Modalities Electrical Stimulation   Electrical Stimulation   Electrical Stimulation Location L Bicep and tricep/ forearm   Electrical Stimulation Action IFC   Electrical Stimulation Parameters 1-10 Hz x15 min   Electrical Stimulation Goals Pain;Tone   Manual Therapy   Manual Therapy Soft tissue mobilization;Passive ROM   Soft tissue mobilization STW to L Bicep/Tricep/ Deltoids/proximal forearm to decrease tightness   Passive ROM PROM of L elbow into flexion/extension/pronation/supination with longer holds at end range for extension                     PT Long Term Goals - 10/02/15 1126    PT LONG TERM GOAL #1   Title I with HEP   Time 4   Period Weeks   Status New   PT LONG TERM GOAL #2   Title full L elbow flexion and extension to restore function.   Time 4   Period Weeks   Status New   PT LONG TERM GOAL #3   Title 5/5 L elbow strength to allow RTW   Time 4   Period Weeks   Status New  Plan - 10/08/15 1154    Clinical Impression Statement Patient tolerated today's treatment well with no reports of increased pain or discomfort with any aspect of today's treatment. Patient continues to present with increased tightness and tone in R Bicep/Deltoids and minimally in triceps as well as proximal forearm musculature. Firm end feels noted with al directions of L elbow and forearm ROM. Patient continues to be limited in regards to L elbow extension. Normal modalities response noted following removal of the modality. Goals remain on-going at this time secondary to deficit L elbow ROM and strength. Patient continues to experience dizziness upon erect stance from siting or supine and reports no known reason for the initaition of dizziness. Educated patient to not actively complete L elbow flexion and extension per precautions in chart.   Rehab Potential Excellent   PT Frequency 3x / week   PT Duration 4 weeks   PT Treatment/Interventions  ADLs/Self Care Home Management;Cryotherapy;Electrical Stimulation;Therapeutic exercise;Patient/family education;Manual techniques;Vasopneumatic Device;Passive range of motion   PT Next Visit Plan PROM only to elbow (gravity assisted extension okay); STW to biceps/triceps and forearm; edema management as needed. Modalities for pain.   PT Home Exercise Plan scar massage, AROM wrist and hand.   Consulted and Agree with Plan of Care Patient      Patient will benefit from skilled therapeutic intervention in order to improve the following deficits and impairments:  Decreased range of motion, Pain, Increased edema, Decreased strength  Visit Diagnosis: Stiffness of left elbow, not elsewhere classified  Pain in left elbow     Problem List Patient Active Problem List   Diagnosis Date Noted  . S/P ORIF (open reduction internal fixation) fracture 09/13/2015  . HTN (hypertension) 10/17/2012    Evelene Croon, PTA 10/08/2015, 12:10 PM  [facility] [address] [place], [place], [address] Phone: [phone]   Fax:  [phone]  Name: Jacob Benson MRN: [id number] Date of Birth: 16-Sep-1985

## 2015-10-14 ENCOUNTER — Encounter: Payer: Self-pay | Admitting: Physical Therapy

## 2015-10-14 ENCOUNTER — Ambulatory Visit: Payer: 59 | Admitting: Physical Therapy

## 2015-10-14 DIAGNOSIS — M25522 Pain in left elbow: Secondary | ICD-10-CM | POA: Diagnosis not present

## 2015-10-14 DIAGNOSIS — M25622 Stiffness of left elbow, not elsewhere classified: Secondary | ICD-10-CM

## 2015-10-14 NOTE — Therapy (Signed)
[facility] [address] [place], [place], [address] Phone: [phone]   Fax:  [phone]  Physical Therapy Treatment  Patient Details  Name: Jacob Benson MRN: [id number] Date of Birth: 09-20-1985 Referring Provider: Gershon MusselNaiping Xu, MD  Encounter Date: 10/14/2015      PT End of Session - 10/14/15 1256    Visit Number 4   Number of Visits 12   Date for PT Re-Evaluation 11/13/15   PT Start Time 1301   PT Stop Time 1342   PT Time Calculation (min) 41 min      Past Medical History  Diagnosis Date  . Hypertension   . Hyperlipidemia   . Calcium kidney stones 2007    Past Surgical History  Procedure Laterality Date  . Tonsillectomy    . Fracture surgery    . Orif humerus fracture Left 09/13/2015    Procedure: OPEN REDUCTION INTERNAL FIXATION (ORIF) ELBOW/OLECRANON AND I AND D;  Surgeon: Tarry KosNaiping M Xu, MD;  Location: [facility] OR;  Service: Orthopedics;  Laterality: Left;  . I&d extremity Left 09/13/2015    Procedure: IRRIGATION AND DEBRIDEMENT EXTREMITY;  Surgeon: Tarry KosNaiping M Xu, MD;  Location: [facility] OR;  Service: Orthopedics;  Laterality: Left;    There were no vitals filed for this visit.      Subjective Assessment - 10/14/15 1256    Subjective Reports elbow is "pretty good." Reports that he is still having trouble with sleeping and is sensitive around incision.   Pertinent History HTN   Patient Stated Goals get back motion and strength   Currently in Pain? Yes   Pain Score 1    Pain Location Elbow   Pain Orientation Left   Pain Radiating Towards pain in wrist   Pain Onset 1 to 4 weeks ago   Pain Frequency Intermittent            [facility] PT Assessment - 10/14/15 0001    Assessment   Medical Diagnosis s/p ORIF L olecranon   Onset Date/Surgical Date 09/13/15   Hand Dominance Left   Next MD Visit 10/24/15   Prior Therapy none   Precautions   Precaution Comments NO AAROM, NO AROM, NO LIFTING                      [facility] Adult PT Treatment/Exercise - 10/14/15 0001    Modalities   Modalities Electrical Stimulation   Electrical Stimulation   Electrical Stimulation Location L Bicep and tricep/ forearm   Electrical Stimulation Action Pre-Mod   Electrical Stimulation Parameters 80-150 Hz x15 min   Electrical Stimulation Goals Pain;Tone   Manual Therapy   Manual Therapy Soft tissue mobilization;Passive ROM   Soft tissue mobilization STW to L Bicep and incision to decrease tightness and promote proper mobility respectively   Passive ROM PROM of L elbow into flexion/extension/pronation/supination with longer holds at end range for extension                     PT Long Term Goals - 10/02/15 1126    PT LONG TERM GOAL #1   Title I with HEP   Time 4   Period Weeks   Status New   PT LONG TERM GOAL #2   Title full L elbow flexion and extension to restore function.   Time 4   Period Weeks   Status New   PT LONG TERM GOAL #3   Title 5/5 L elbow strength to allow RTW  Time 4   Period Weeks   Status New               Plan - 10/14/15 1328    Clinical Impression Statement Patient tolerated today's treatment well although he continues to arrive to treatment with tightness especially in L Bicep region especially in distal region. Patient reports that he can tell a difference in L forearm pronation and supination and is ready to see improvements in regards to L elbow flexion and extension. PROM of L elbow into flexion and extension was completed with overpressure at distal L Bicep insertion. Patient's L elbow incision demonstrated good mobility although slightly limited in region just distal to L olecranon process. Patient experienced greater discomfort (4/10) with distal incision mobilizations per patient report. Normal modalities response noted following removal of the modality. Goals remain on-going at this time secondary to limitations set by MD at this time. Patient continues to  experience dizziness upon sitting from supine that is not to severity as it was previously due to medication that was prescribed.   Rehab Potential Excellent   PT Frequency 3x / week   PT Duration 4 weeks   PT Treatment/Interventions ADLs/Self Care Home Management;Cryotherapy;Electrical Stimulation;Therapeutic exercise;Patient/family education;Manual techniques;Vasopneumatic Device;Passive range of motion   PT Next Visit Plan PROM only to elbow (gravity assisted extension okay); STW to biceps/triceps and forearm; edema management as needed. Modalities for pain.   PT Home Exercise Plan scar massage, AROM wrist and hand.   Consulted and Agree with Plan of Care Patient      Patient will benefit from skilled therapeutic intervention in order to improve the following deficits and impairments:  Decreased range of motion, Pain, Increased edema, Decreased strength  Visit Diagnosis: Stiffness of left elbow, not elsewhere classified  Pain in left elbow     Problem List Patient Active Problem List   Diagnosis Date Noted  . S/P ORIF (open reduction internal fixation) fracture 09/13/2015  . HTN (hypertension) 10/17/2012    Evelene Croon, PTA 10/14/2015, 1:44 PM  [facility] [address] [place], [place], [address] Phone: [phone]   Fax:  [phone]  Name: Jacob Benson MRN: [id number] Date of Birth: [birth year]

## 2015-10-16 ENCOUNTER — Encounter: Payer: 59 | Admitting: *Deleted

## 2015-10-17 ENCOUNTER — Ambulatory Visit: Payer: 59 | Attending: Orthopaedic Surgery | Admitting: Physical Therapy

## 2015-10-17 ENCOUNTER — Encounter: Payer: Self-pay | Admitting: Physical Therapy

## 2015-10-17 DIAGNOSIS — M25522 Pain in left elbow: Secondary | ICD-10-CM | POA: Diagnosis not present

## 2015-10-17 DIAGNOSIS — M25622 Stiffness of left elbow, not elsewhere classified: Secondary | ICD-10-CM | POA: Insufficient documentation

## 2015-10-17 NOTE — Therapy (Signed)
[facility] [address] [place], [place], [address] Phone: [phone]   Fax:  [phone]  Physical Therapy Treatment  Patient Details  Name: Jacob Benson MRN: [id number] Date of Birth: [birth year] Referring Provider: Gershon MusselNaiping Xu, MD  Encounter Date: 10/17/2015      PT End of Session - 10/17/15 0942    Visit Number 5   Number of Visits 12   Date for PT Re-Evaluation 11/13/15   PT Start Time 0947   PT Stop Time 1010  2 units secondary to patient denial of modalities   PT Time Calculation (min) 23 min   Activity Tolerance Patient tolerated treatment well   Behavior During Therapy [facility] for tasks assessed/performed      Past Medical History  Diagnosis Date  . Hypertension   . Hyperlipidemia   . Calcium kidney stones 2007    Past Surgical History  Procedure Laterality Date  . Tonsillectomy    . Fracture surgery    . Orif humerus fracture Left 09/13/2015    Procedure: OPEN REDUCTION INTERNAL FIXATION (ORIF) ELBOW/OLECRANON AND I AND D;  Surgeon: Tarry KosNaiping M Xu, MD;  Location: [facility] OR;  Service: Orthopedics;  Laterality: Left;  . I&d extremity Left 09/13/2015    Procedure: IRRIGATION AND DEBRIDEMENT EXTREMITY;  Surgeon: Tarry KosNaiping M Xu, MD;  Location: [facility] OR;  Service: Orthopedics;  Laterality: Left;    There were no vitals filed for this visit.      Subjective Assessment - 10/17/15 0942    Subjective reports that his son dove down on his elbow this morning but reports that his elbow doesn't really hurt.    Pertinent History HTN   Patient Stated Goals get back motion and strength   Currently in Pain? No/denies            [facility] PT Assessment - 10/17/15 0001    Assessment   Medical Diagnosis s/p ORIF L olecranon   Onset Date/Surgical Date 09/13/15   Hand Dominance Left   Next MD Visit 10/24/15   Prior Therapy none   Precautions   Precaution Comments NO AAROM, NO AROM, NO LIFTING                     [facility]  Adult PT Treatment/Exercise - 10/17/15 0001    Manual Therapy   Manual Therapy Soft tissue mobilization;Passive ROM   Soft tissue mobilization STW to L Bicep and incision to decrease tightness and promote proper mobility respectively   Passive ROM PROM of L elbow into flexion/extension/pronation/supination with longer holds at end range for extension                     PT Long Term Goals - 10/17/15 1018    PT LONG TERM GOAL #1   Title I with HEP   Time 4   Period Weeks   Status Achieved   PT LONG TERM GOAL #2   Title full L elbow flexion and extension to restore function.   Time 4   Period Weeks   Status On-going   PT LONG TERM GOAL #3   Title 5/5 L elbow strength to allow RTW   Time 4   Period Weeks   Status On-going               Plan - 10/17/15 1014    Clinical Impression Statement Patient continues to tolerate PROM treatments well and arrived with no reports of pain and with decreased L Bicep  tightness. Patient reports that he experienced a shooting pain from L elbow to L wrist with PROM into flexion today at the beginning but had no reports towards end of treatment. L distal Bicep continues to present with increased tightness at this time but no other abnormal tightness was noted in L forearm musculature or deltoids. Patient continues to note sensitivity around L elbow incision and around B epicondyles. Patient reports that he now has no pain or difficulty with L shoulder ROM or pain with L wrist ROM. Patient denied modalities today as he did not have pain..   Rehab Potential Excellent   PT Frequency 3x / week   PT Duration 4 weeks   PT Treatment/Interventions ADLs/Self Care Home Management;Cryotherapy;Electrical Stimulation;Therapeutic exercise;Patient/family education;Manual techniques;Vasopneumatic Device;Passive range of motion   PT Next Visit Plan PROM only to elbow (gravity assisted extension okay); STW to biceps/triceps and forearm; edema management  as needed. Modalities for pain.   PT Home Exercise Plan scar massage, AROM wrist and hand.   Consulted and Agree with Plan of Care Patient      Patient will benefit from skilled therapeutic intervention in order to improve the following deficits and impairments:  Decreased range of motion, Pain, Increased edema, Decreased strength  Visit Diagnosis: Stiffness of left elbow, not elsewhere classified  Pain in left elbow     Problem List Patient Active Problem List   Diagnosis Date Noted  . S/P ORIF (open reduction internal fixation) fracture 09/13/2015  . HTN (hypertension) 10/17/2012    Evelene Croon, PTA 10/17/2015, 10:20 AM  [facility] [address] [place], [place], [address] Phone: [phone]   Fax:  [phone]  Name: Jacob Benson MRN: [id number] Date of Birth: 21-Nov-1985

## 2015-10-20 ENCOUNTER — Encounter: Payer: Self-pay | Admitting: Physical Therapy

## 2015-10-20 ENCOUNTER — Ambulatory Visit: Payer: 59 | Admitting: Physical Therapy

## 2015-10-20 ENCOUNTER — Telehealth: Payer: Self-pay | Admitting: *Deleted

## 2015-10-20 DIAGNOSIS — M25522 Pain in left elbow: Secondary | ICD-10-CM

## 2015-10-20 DIAGNOSIS — M25622 Stiffness of left elbow, not elsewhere classified: Secondary | ICD-10-CM

## 2015-10-20 NOTE — Therapy (Signed)
[facility] [address] [place], [place], [address] Phone: [phone]   Fax:  [phone]  Physical Therapy Treatment  Patient Details  Name: Jacob Benson MRN: [id number] Date of Birth: 08-26-1985 Referring Provider: Gershon Mussel, MD  Encounter Date: 10/20/2015      PT End of Session - 10/20/15 1147    Visit Number 6   Number of Visits 12   PT Start Time 1114   PT Stop Time 1203   PT Time Calculation (min) 49 min   Activity Tolerance Patient tolerated treatment well   Behavior During Therapy [facility] for tasks assessed/performed      Past Medical History  Diagnosis Date  . Hypertension   . Hyperlipidemia   . Calcium kidney stones 2007    Past Surgical History  Procedure Laterality Date  . Tonsillectomy    . Fracture surgery    . Orif humerus fracture Left 09/13/2015    Procedure: OPEN REDUCTION INTERNAL FIXATION (ORIF) ELBOW/OLECRANON AND I AND D;  Surgeon: Tarry Kos, MD;  [facility] OR;  Service: Orthopedics;  Laterality: Left;  . I&d extremity Left 09/13/2015    Procedure: IRRIGATION AND DEBRIDEMENT EXTREMITY;  Surgeon: Tarry Kos, MD;  [facility]: [facility] OR;  Service: Orthopedics;  Laterality: Left;    There were no vitals filed for this visit.      Subjective Assessment - 10/20/15 1116    Subjective patient reported no pain at rest today yet up to 2-3/10 with certain movement   Pertinent History HTN   Patient Stated Goals get back motion and strength   Currently in Pain? Yes   Pain Score 2    Pain Location Elbow   Pain Orientation Left   Pain Descriptors / Indicators Aching   Pain Type Surgical pain   Pain Onset 1 to 4 weeks ago   Pain Frequency Intermittent   Aggravating Factors  certain movements   Pain Relieving Factors rest                         [facility] Adult PT Treatment/Exercise - 10/20/15 0001    Electrical Stimulation   Electrical Stimulation Location L Bicep and tricep/ forearm    Electrical Stimulation Action IFC   Electrical Stimulation Parameters 80-150   Electrical Stimulation Goals Pain;Tone   Manual Therapy   Manual Therapy Passive ROM   Soft tissue mobilization --   Passive ROM PROM of L elbow into flexion/extension/pronation/supination with longer holds at end range for extension                     PT Long Term Goals - 10/17/15 1018    PT LONG TERM GOAL #1   Title I with HEP   Time 4   Period Weeks   Status Achieved   PT LONG TERM GOAL #2   Title full L elbow flexion and extension to restore function.   Time 4   Period Weeks   Status On-going   PT LONG TERM GOAL #3   Title 5/5 L elbow strength to allow RTW   Time 4   Period Weeks   Status On-going               Plan - 10/20/15 1150    Clinical Impression Statement Patient progressing slowly due to stiffness and decreased ROM in left elbow. Progressing with PROM only per MD at this time. MPT educated patient on self PROM for  left elbow ext. Patient has not done anything with arm at this time until MD gives him progression. Goals ongoing due to ROM and strength deficit / MD orders.   Rehab Potential Excellent   PT Frequency 3x / week   PT Duration 4 weeks   PT Treatment/Interventions ADLs/Self Care Home Management;Cryotherapy;Electrical Stimulation;Therapeutic exercise;Patient/family education;Manual techniques;Vasopneumatic Device;Passive range of motion   PT Next Visit Plan PROM only to elbow (gravity assisted extension okay); STW to biceps/triceps and forearm; edema management as needed. Modalities for pain. (MD. Gershon MusselNaiping Xu 10/24/15)   Consulted and Agree with Plan of Care Patient      Patient will benefit from skilled therapeutic intervention in order to improve the following deficits and impairments:  Decreased range of motion, Pain, Increased edema, Decreased strength  Visit Diagnosis: Stiffness of left elbow, not elsewhere classified  Pain in left  elbow     Problem List Patient Active Problem List   Diagnosis Date Noted  . S/P ORIF (open reduction internal fixation) fracture 09/13/2015  . HTN (hypertension) 10/17/2012    Kaushal Vannice P, PTA 10/20/2015, 12:09 PM  [facility] [address] [place], [place], [address] Phone: [phone]   Fax:  [phone]  Name: Jacob Benson MRN: [id number] Date of Birth: Jan 16, 1986

## 2015-10-20 NOTE — Telephone Encounter (Signed)
If still having sx we can refer to Neuro

## 2015-10-22 ENCOUNTER — Encounter: Payer: Self-pay | Admitting: Physical Therapy

## 2015-10-22 ENCOUNTER — Ambulatory Visit: Payer: 59 | Admitting: Physical Therapy

## 2015-10-22 DIAGNOSIS — M25622 Stiffness of left elbow, not elsewhere classified: Secondary | ICD-10-CM | POA: Diagnosis not present

## 2015-10-22 DIAGNOSIS — M25522 Pain in left elbow: Secondary | ICD-10-CM

## 2015-10-22 NOTE — Therapy (Signed)
[facility] [address] [place], [place], [address] Phone: [phone]   Fax:  [phone]  Physical Therapy Treatment  Patient Details  Name: Jacob Benson MRN: [id number] Date of Birth: Aug 07, 1985 Referring Provider: Gershon Mussel, MD  Encounter Date: 10/22/2015      PT End of Session - 10/22/15 1123    Visit Number 7   Number of Visits 12   Date for PT Re-Evaluation 11/13/15   PT Start Time 1115   PT Stop Time 1200   PT Time Calculation (min) 45 min   Activity Tolerance Patient tolerated treatment well   Behavior During Therapy [facility] for tasks assessed/performed      Past Medical History  Diagnosis Date  . Hypertension   . Hyperlipidemia   . Calcium kidney stones 2007    Past Surgical History  Procedure Laterality Date  . Tonsillectomy    . Fracture surgery    . Orif humerus fracture Left 09/13/2015    Procedure: OPEN REDUCTION INTERNAL FIXATION (ORIF) ELBOW/OLECRANON AND I AND D;  Surgeon: Tarry Kos, MD;  [facility] OR;  Service: Orthopedics;  Laterality: Left;  . I&d extremity Left 09/13/2015    Procedure: IRRIGATION AND DEBRIDEMENT EXTREMITY;  Surgeon: Tarry Kos, MD;  Location: [facility] OR;  Service: Orthopedics;  Laterality: Left;    There were no vitals filed for this visit.      Subjective Assessment - 10/22/15 1120    Subjective patient reported no pain at rest today yet up to 2-3/10 with certain movement, some soreness from sleeping wrong   Pertinent History HTN   Patient Stated Goals get back motion and strength   Currently in Pain? Yes   Pain Score 2    Pain Location Elbow   Pain Orientation Left   Pain Descriptors / Indicators Aching   Pain Type Surgical pain   Pain Onset 1 to 4 weeks ago   Pain Frequency Intermittent   Aggravating Factors  certain movements   Pain Relieving Factors rest            [facility] PT Assessment - 10/22/15 0001    PROM   PROM Assessment Site Elbow   Right/Left Elbow  Left   Left Elbow Flexion 113   Left Elbow Extension 50                     [facility] Adult PT Treatment/Exercise - 10/22/15 0001    Electrical Stimulation   Electrical Stimulation Location L Bicep and tricep/ forearm   Electrical Stimulation Action IFC   Electrical Stimulation Parameters 80-150hz    Electrical Stimulation Goals Pain;Tone   Manual Therapy   Manual Therapy Passive ROM   Passive ROM PROM of L elbow into flexion/extension/pronation/supination with longer holds at end range for extension                     PT Long Term Goals - 10/17/15 1018    PT LONG TERM GOAL #1   Title I with HEP   Time 4   Period Weeks   Status Achieved   PT LONG TERM GOAL #2   Title full L elbow flexion and extension to restore function.   Time 4   Period Weeks   Status On-going   PT LONG TERM GOAL #3   Title 5/5 L elbow strength to allow RTW   Time 4   Period Weeks   Status On-going  Plan - 10/22/15 1148    Clinical Impression Statement Patient progressing with PROM slowly due to stiffness in left elbow. Patient has increased PROM today foe elbow flexion and ext. Patient has not been using arm and has continued to follow MD guidelines. Patient would like to progress when appropriate so he can return to work and nomal ADL's. Goals ongoing at this time due to ROM and strength deficits.    Rehab Potential Excellent   PT Frequency 3x / week   PT Duration 4 weeks   PT Treatment/Interventions ADLs/Self Care Home Management;Cryotherapy;Electrical Stimulation;Therapeutic exercise;Patient/family education;Manual techniques;Vasopneumatic Device;Passive range of motion   PT Next Visit Plan cont per MD orders appt tomorrow for Dr. Gershon MusselNaiping XU   Consulted and Agree with Plan of Care Patient      Patient will benefit from skilled therapeutic intervention in order to improve the following deficits and impairments:  Decreased range of motion, Pain, Increased  edema, Decreased strength  Visit Diagnosis: Stiffness of left elbow, not elsewhere classified  Pain in left elbow     Problem List Patient Active Problem List   Diagnosis Date Noted  . S/P ORIF (open reduction internal fixation) fracture 09/13/2015  . HTN (hypertension) 10/17/2012    APPLEGATE, [place], PTA 10/22/2015, 2:13 PM  [place] Applegate MPT [place] Lossie Kalp, [place]A 10/22/2015 2:13 PM  [facility] [address] [place], [place], [address] Phone: [phone]   Fax:  [phone]  Name: Jacob Benson MRN: [id number] Date of Birth: 1985/06/15

## 2015-10-24 DIAGNOSIS — S52022D Displaced fracture of olecranon process without intraarticular extension of left ulna, subsequent encounter for closed fracture with routine healing: Secondary | ICD-10-CM | POA: Diagnosis not present

## 2015-10-25 ENCOUNTER — Encounter (HOSPITAL_COMMUNITY): Payer: Self-pay | Admitting: Emergency Medicine

## 2015-10-25 ENCOUNTER — Emergency Department (HOSPITAL_COMMUNITY): Payer: 59

## 2015-10-25 ENCOUNTER — Emergency Department (HOSPITAL_COMMUNITY)
Admission: EM | Admit: 2015-10-25 | Discharge: 2015-10-26 | Disposition: A | Discharge status: Home / Self Care | Payer: 59 | Attending: Emergency Medicine | Admitting: Emergency Medicine

## 2015-10-25 DIAGNOSIS — S59902A Unspecified injury of left elbow, initial encounter: Secondary | ICD-10-CM | POA: Insufficient documentation

## 2015-10-25 DIAGNOSIS — Y939 Activity, unspecified: Secondary | ICD-10-CM | POA: Insufficient documentation

## 2015-10-25 DIAGNOSIS — I10 Essential (primary) hypertension: Secondary | ICD-10-CM | POA: Insufficient documentation

## 2015-10-25 DIAGNOSIS — E785 Hyperlipidemia, unspecified: Secondary | ICD-10-CM | POA: Diagnosis not present

## 2015-10-25 DIAGNOSIS — Y999 Unspecified external cause status: Secondary | ICD-10-CM | POA: Insufficient documentation

## 2015-10-25 DIAGNOSIS — Z87891 Personal history of nicotine dependence: Secondary | ICD-10-CM | POA: Diagnosis not present

## 2015-10-25 DIAGNOSIS — W1839XA Other fall on same level, initial encounter: Secondary | ICD-10-CM | POA: Diagnosis not present

## 2015-10-25 DIAGNOSIS — Y929 Unspecified place or not applicable: Secondary | ICD-10-CM | POA: Diagnosis not present

## 2015-10-25 DIAGNOSIS — S52022A Displaced fracture of olecranon process without intraarticular extension of left ulna, initial encounter for closed fracture: Secondary | ICD-10-CM | POA: Diagnosis not present

## 2015-10-25 MED ORDER — HYDROCODONE-ACETAMINOPHEN 5-325 MG PO TABS
1.0000 | ORAL_TABLET | Freq: Once | ORAL | Status: AC
  Administered 2015-10-25: 1 via ORAL
  Filled 2015-10-25: qty 1

## 2015-10-25 NOTE — ED Notes (Signed)
Patient transported to x-ray. ?

## 2015-10-25 NOTE — ED Provider Notes (Signed)
History  By signing my name below, I, Earmon PhoenixJennifer Waddell, attest that this documentation has been prepared under the direction and in the presence of Melburn HakeNicole Brittaney Beaulieu, [place]. Electronically Signed: Earmon PhoenixJennifer Waddell, ED Scribe. 10/25/2015. 10:34 PM.  Chief Complaint  Patient presents with  . Arm Pain   The history is provided by the patient and medical records. No language interpreter was used.    HPI Comments:  Jacob Benson is a 30 y.o. male s/p left elbow surgery 6 weeks ago (ORIF done on 09/13/15 due to olecranon fx) by Dr. Roda ShuttersXu who presents to the Emergency Department complaining of a fall that occurred earlier today. He reports he was chasing his son when his dog cut him off causing him to fall directly on the left elbow. He reports associated swelling of the left elbow. He has not taken anything for pain but has applied ice. He denies modifying factors. He denies head trauma, LOC, numbness, tingling or weakness of the left elbow or LUE, left shoulder or wrist pain, bruising, wounds. He reports allergies to Percocet (nausea) and Guaifenesin.  Past Medical History  Diagnosis Date  . Hypertension   . Hyperlipidemia   . Calcium kidney stones 2007   Past Surgical History  Procedure Laterality Date  . Tonsillectomy    . Fracture surgery    . Orif humerus fracture Left 09/13/2015    Procedure: OPEN REDUCTION INTERNAL FIXATION (ORIF) ELBOW/OLECRANON AND I AND D;  Surgeon: Tarry KosNaiping M Xu, MD;  Location: [facility] OR;  Service: Orthopedics;  Laterality: Left;  . I&d extremity Left 09/13/2015    Procedure: IRRIGATION AND DEBRIDEMENT EXTREMITY;  Surgeon: Tarry KosNaiping M Xu, MD;  Location: [facility] OR;  Service: Orthopedics;  Laterality: Left;   Family History  Problem Relation Age of Onset  . Hypertension Mother   . Hypertension Father   . Hypertension Sister   . Heart disease Maternal Grandmother   . CAD Maternal Grandmother     bypass surgery   Social History  Substance Use Topics  . Smoking status: Former  Smoker -- 0.50 packs/day    Types: Cigarettes    Quit date: 05/23/2013  . Smokeless tobacco: Former NeurosurgeonUser  . Alcohol Use: No    Review of Systems  Musculoskeletal: Positive for arthralgias.  Skin: Negative for color change and wound.  Neurological: Negative for syncope, weakness and numbness.    Allergies  Guaifenesin & derivatives  Home Medications   Prior to Admission medications   Medication Sig Start Date End Date Taking? Authorizing Provider  amLODipine (NORVASC) 5 MG tablet Take 1 tablet (5 mg total) by mouth daily. 02/17/15   Ernestina Pennaonald W Moore, MD  azelastine (ASTELIN) 0.1 % nasal spray Place 2 sprays into both nostrils at bedtime. Use in each nostril as directed 08/20/15   Ernestina Pennaonald W Moore, MD  hydrochlorothiazide (MICROZIDE) 12.5 MG capsule Take 1 capsule (12.5 mg total) by mouth daily. 02/17/15   Ernestina Pennaonald W Moore, MD  HYDROcodone-acetaminophen (NORCO/VICODIN) 5-325 MG tablet Take 1-2 tablets by mouth every 4 (four) hours as needed. 10/26/15   Barrett HenleNicole Elizabeth Jeramia Saleeby, PA-C  meclizine (ANTIVERT) 25 MG tablet Take 1 tablet (25 mg total) by mouth 3 (three) times daily as needed for dizziness. 10/08/15   Inis SizerWilliam L Webster, PA-C  methocarbamol (ROBAXIN) 750 MG tablet Take 1 tablet (750 mg total) by mouth 2 (two) times daily as needed for muscle spasms. Patient not taking: Reported on 10/07/2015 09/13/15   Tarry KosNaiping M Xu, MD  naproxen sodium (ALEVE) 220 MG tablet  Take 440 mg by mouth daily.    Historical Provider, MD  ondansetron (ZOFRAN) 4 MG tablet Take 1-2 tablets (4-8 mg total) by mouth every 8 (eight) hours as needed for nausea or vomiting. Patient not taking: Reported on 10/01/2015 09/13/15   Tarry Kos, MD  oxyCODONE (OXY IR/ROXICODONE) 5 MG immediate release tablet Take 1-3 tablets (5-15 mg total) by mouth every 4 (four) hours as needed. Patient not taking: Reported on 10/07/2015 09/13/15   Tarry Kos, MD  senna-docusate (SENOKOT S) 8.6-50 MG tablet Take 1 tablet by mouth at bedtime as  needed. Patient not taking: Reported on 10/02/2015 09/13/15   Tarry Kos, MD   Triage Vitals: BP 153/81 mmHg  Pulse 87  Temp(Src) 98.1 F (36.7 C) (Oral)  Resp 18  Ht  (1.778 m)  Wt 185 lb (83.915 kg)  BMI 26.54 kg/m2  SpO2 99% Physical Exam  Constitutional: He is oriented to person, place, and time. He appears well-developed and well-nourished.  HENT:  Head: Normocephalic and atraumatic.  Eyes: Conjunctivae and EOM are normal. Right eye exhibits no discharge. Left eye exhibits no discharge. No scleral icterus.  Cardiovascular:  Left radial pulse 2+. Cap refill less than 2 seconds.  Pulmonary/Chest: Effort normal.  Musculoskeletal: He exhibits tenderness.       Left elbow: He exhibits decreased range of motion and swelling. He exhibits no effusion, no deformity and no laceration. Tenderness found. Medial epicondyle, lateral epicondyle and olecranon process tenderness noted. No radial head tenderness noted.  Mild swelling noted to left elbow. TTP of left distal humerus and medial and lateral epicondyle. Decreased ROM of left elbow due to pain. Full ROM of left hand, wrist, forearm and shoulder.  Neurological: He is alert and oriented to person, place, and time.  Sensations grossly intact.  Nursing note and vitals reviewed.   ED Course  Procedures (including critical care time) DIAGNOSTIC STUDIES: Oxygen Saturation is 99% on RA, normal by my interpretation.   COORDINATION OF CARE: 10:25 PM- Will wait for X-Rays to result and will order Vicodin for pain. Pt verbalizes understanding and agrees to plan.  Medications  HYDROcodone-acetaminophen (NORCO/VICODIN) 5-325 MG per tablet 1 tablet (1 tablet Oral Given 10/25/15 2236)    Labs Review Labs Reviewed - No data to display  Imaging Review Dg Elbow Complete Left  10/25/2015  CLINICAL DATA:  Tripped and fell on elbow, with severe pain. Initial encounter. EXAM: LEFT ELBOW - COMPLETE 3+ VIEW COMPARISON:  Left elbow radiographs  performed 09/13/2015 FINDINGS: There is new dorsal dislocation and angulation of the olecranon, with the large fragment displaced proximal to the plate and screws along the proximal ulna. No definite new fractures are seen. An associated small elbow joint effusion is suggested. Overlying soft tissue swelling is noted. IMPRESSION: New dorsal dislocation and angulation of the olecranon, with the large fragment displaced proximal to the plate and screws along the proximal ulna. Associated small joint effusion suggested. Electronically Signed   By: Roanna Raider M.D.   On: 10/25/2015 22:27   Dg Humerus Left  10/25/2015  CLINICAL DATA:  Tripped and fell on elbow. Recent left elbow surgery. Severe left elbow pain. Initial encounter. EXAM: LEFT HUMERUS - 2+ VIEW COMPARISON:  Left elbow radiographs performed 09/13/2015 FINDINGS: There is dorsal displacement and angulation of the olecranon fragment, dissociated from the patient's ulnar plate and screws. The humerus appears grossly intact. The left humeral head remains seated at the glenoid fossa. The left acromioclavicular joint is grossly  unremarkable. Soft tissue swelling is noted about the elbow. IMPRESSION: Dorsal displacement and angulation of the olecranon fragment, dissociated from the patient's ulnar plate and screws. Electronically Signed   By: Roanna Raider M.D.   On: 10/25/2015 22:28   I have personally reviewed and evaluated these images and lab results as part of my medical decision-making.  SPLINT APPLICATION Date/Time: 12:29 AM Authorized by: Barrett Henle Consent: Verbal consent obtained. Risks and benefits: risks, benefits and alternatives were discussed Consent given by: patient Splint applied by: orthopedic technician Location details: left arm Splint type: posterior long arm splint Post-procedure: The splinted body part was neurovascularly unchanged following the procedure. Patient tolerance: Patient tolerated the procedure  well with no immediate complications.     MDM   Final diagnoses:  Elbow injury, left, initial encounter    Patient presents with left elbow pain and swelling after falling earlier this evening. Denies head injury or LOC. History of left olecranon fracture with ORIF repair appx. 6 weeks ago bu Dr. Roda Shutters. VSS. Exam revealed mild swelling and tenderness to left elbow at lateral and medial epicondyle, posterior elbow and proximal humerus. Decreased range of motion of elbow due to reported pain. Right upper extremity neurovascularly intact. Patient given pain meds in the ED. Left elbow x-ray revealed new dorsal displacement of the olecranon with small joint effusion. Consulted cardiology. Dr. Roda Shutters advised to place patient in a posterior arm splint and have the patient follow-up with him in his office on Monday morning. Discussed results and plan for discharge with patient. Plan to discharge patient home with pain meds and symptomatic treatment. Discussed return precautions with patient.  I personally performed the services described in this documentation, which was scribed in my presence. The recorded information has been reviewed and is accurate.    Satira Sark [place], [place] 10/26/15 0030  Loren Racer, MD 10/26/15 1335

## 2015-10-25 NOTE — ED Notes (Signed)
Pt was running, dog cut in front of him and he fell forward and landed on L elbow. Pt had surgery on his L elbow six weeks ago. Swelling and pain to L elbow. Pulses palpable in L radial. Ice pack applied by patient.

## 2015-10-26 DIAGNOSIS — Z87891 Personal history of nicotine dependence: Secondary | ICD-10-CM | POA: Diagnosis not present

## 2015-10-26 DIAGNOSIS — I10 Essential (primary) hypertension: Secondary | ICD-10-CM | POA: Diagnosis not present

## 2015-10-26 DIAGNOSIS — S59902A Unspecified injury of left elbow, initial encounter: Secondary | ICD-10-CM | POA: Diagnosis not present

## 2015-10-26 DIAGNOSIS — E785 Hyperlipidemia, unspecified: Secondary | ICD-10-CM | POA: Diagnosis not present

## 2015-10-26 MED ORDER — HYDROCODONE-ACETAMINOPHEN 5-325 MG PO TABS: 1.0000 | ORAL_TABLET | ORAL | Status: DC | PRN

## 2015-10-26 NOTE — Discharge Instructions (Signed)
Take your medications as prescribed as needed for pain relief. He may also take 600 mg ibuprofen 4 times daily to help with pain and swelling. Call to follow-up with Dr. Roda ShuttersXu on Monday for further management of your left elbow injury. Return to the emergency department if symptoms worsen or new onset of worsening pain, swelling, numbness, tingling, weakness.

## 2015-10-27 ENCOUNTER — Encounter: Payer: 59 | Admitting: Physical Therapy

## 2015-10-27 ENCOUNTER — Other Ambulatory Visit: Payer: Self-pay | Admitting: Orthopaedic Surgery

## 2015-10-27 DIAGNOSIS — S52022D Displaced fracture of olecranon process without intraarticular extension of left ulna, subsequent encounter for closed fracture with routine healing: Secondary | ICD-10-CM | POA: Diagnosis not present

## 2015-10-29 ENCOUNTER — Encounter (HOSPITAL_COMMUNITY): Payer: Self-pay | Admitting: *Deleted

## 2015-10-29 ENCOUNTER — Encounter: Payer: 59 | Admitting: Physical Therapy

## 2015-10-29 MED ORDER — CEFAZOLIN SODIUM-DEXTROSE 2-4 GM/100ML-% IV SOLN
2.0000 g | INTRAVENOUS | Status: AC
  Administered 2015-10-30: 2 g via INTRAVENOUS
  Filled 2015-10-29: qty 100

## 2015-10-29 NOTE — Progress Notes (Signed)
Pt denies SOB, chest pain, and being under the care of a cardiologist. Pt denies having a stress test, echo and cardiac cath. Pt made aware to stop Stop taking Aspirin, fish oil, and herbal medications. Do not take any NSAIDs ie: Ibuprofen, Advil, Naproxen, BC and Goody Powder or any medication containing Aspirin. Pt verbalized understanding of all pre-op instructions.

## 2015-10-30 ENCOUNTER — Ambulatory Visit (HOSPITAL_COMMUNITY): Payer: 59

## 2015-10-30 ENCOUNTER — Encounter (HOSPITAL_COMMUNITY)
Admission: RE | Disposition: A | Discharge status: Home / Self Care | Payer: Self-pay | Source: Ambulatory Visit | Attending: Orthopaedic Surgery

## 2015-10-30 ENCOUNTER — Ambulatory Visit (HOSPITAL_COMMUNITY)
Admission: RE | Admit: 2015-10-30 | Discharge: 2015-10-30 | Disposition: A | Discharge status: Home / Self Care | Payer: 59 | Source: Ambulatory Visit | Attending: Orthopaedic Surgery | Admitting: Orthopaedic Surgery

## 2015-10-30 ENCOUNTER — Encounter (HOSPITAL_COMMUNITY): Payer: Self-pay | Admitting: *Deleted

## 2015-10-30 ENCOUNTER — Ambulatory Visit (HOSPITAL_COMMUNITY): Payer: 59 | Admitting: Certified Registered"

## 2015-10-30 DIAGNOSIS — Z87891 Personal history of nicotine dependence: Secondary | ICD-10-CM | POA: Insufficient documentation

## 2015-10-30 DIAGNOSIS — Z419 Encounter for procedure for purposes other than remedying health state, unspecified: Secondary | ICD-10-CM

## 2015-10-30 DIAGNOSIS — S52022A Displaced fracture of olecranon process without intraarticular extension of left ulna, initial encounter for closed fracture: Secondary | ICD-10-CM | POA: Insufficient documentation

## 2015-10-30 DIAGNOSIS — Z791 Long term (current) use of non-steroidal anti-inflammatories (NSAID): Secondary | ICD-10-CM | POA: Insufficient documentation

## 2015-10-30 DIAGNOSIS — S52022D Displaced fracture of olecranon process without intraarticular extension of left ulna, subsequent encounter for closed fracture with routine healing: Secondary | ICD-10-CM | POA: Diagnosis not present

## 2015-10-30 DIAGNOSIS — G8918 Other acute postprocedural pain: Secondary | ICD-10-CM | POA: Diagnosis not present

## 2015-10-30 DIAGNOSIS — W19XXXA Unspecified fall, initial encounter: Secondary | ICD-10-CM | POA: Diagnosis not present

## 2015-10-30 DIAGNOSIS — Z79899 Other long term (current) drug therapy: Secondary | ICD-10-CM | POA: Insufficient documentation

## 2015-10-30 DIAGNOSIS — M9742XA Periprosthetic fracture around internal prosthetic left elbow joint, initial encounter: Secondary | ICD-10-CM | POA: Diagnosis present

## 2015-10-30 HISTORY — PX: ORIF ELBOW FRACTURE: SHX5031

## 2015-10-30 HISTORY — DX: Displaced fracture of olecranon process without intraarticular extension of unspecified ulna, initial encounter for closed fracture: S52.023A

## 2015-10-30 LAB — BASIC METABOLIC PANEL
Anion gap: 8 (ref 5–15)
BUN: 14 mg/dL (ref 6–20)
CHLORIDE: 102 mmol/L (ref 101–111)
CO2: 27 mmol/L (ref 22–32)
Calcium: 9.4 mg/dL (ref 8.9–10.3)
Creatinine, Ser: 1.17 mg/dL (ref 0.61–1.24)
GFR calc Af Amer: >60 mL/min (ref >60)
GFR calc non Af Amer: >60 mL/min (ref >60)
GLUCOSE: 87 mg/dL (ref 65–99)
POTASSIUM: 4.1 mmol/L (ref 3.5–5.1)
Sodium: 137 mmol/L (ref 135–145)

## 2015-10-30 LAB — CBC
HEMATOCRIT: 43.1 % (ref 39.0–52.0)
HEMOGLOBIN: 14.5 g/dL (ref 13.0–17.0)
MCH: 28.3 pg (ref 26.0–34.0)
MCHC: 33.6 g/dL (ref 30.0–36.0)
MCV: 84.2 fL (ref 78.0–100.0)
Platelets: 241 10*3/uL (ref 150–400)
RBC: 5.12 MIL/uL (ref 4.22–5.81)
RDW: 13 % (ref 11.5–15.5)
WBC: 8 10*3/uL (ref 4.0–10.5)

## 2015-10-30 SURGERY — OPEN REDUCTION INTERNAL FIXATION (ORIF) ELBOW/OLECRANON FRACTURE
Anesthesia: Regional | Site: Elbow | Laterality: Left

## 2015-10-30 MED ORDER — HYDROCODONE-ACETAMINOPHEN 7.5-325 MG PO TABS: 1.0000 | ORAL_TABLET | Freq: Four times a day (QID) | ORAL | Status: DC | PRN

## 2015-10-30 MED ORDER — FENTANYL CITRATE (PF) 100 MCG/2ML IJ SOLN
25.0000 ug | INTRAMUSCULAR | Status: DC | PRN
  Administered 2015-10-30: 25 ug via INTRAVENOUS

## 2015-10-30 MED ORDER — DEXAMETHASONE SODIUM PHOSPHATE 10 MG/ML IJ SOLN
INTRAMUSCULAR | Status: DC | PRN
  Administered 2015-10-30: 5 mg via INTRAVENOUS

## 2015-10-30 MED ORDER — ONDANSETRON HCL 4 MG/2ML IJ SOLN
INTRAMUSCULAR | Status: DC | PRN
  Administered 2015-10-30 (×2): 4 mg via INTRAVENOUS

## 2015-10-30 MED ORDER — FENTANYL CITRATE (PF) 100 MCG/2ML IJ SOLN
INTRAMUSCULAR | Status: DC
Start: 2015-10-30 — End: 2015-10-30
  Filled 2015-10-30: qty 2

## 2015-10-30 MED ORDER — FENTANYL CITRATE (PF) 100 MCG/2ML IJ SOLN
INTRAMUSCULAR | Status: DC | PRN
  Administered 2015-10-30 (×2): 50 ug via INTRAVENOUS
  Administered 2015-10-30 (×2): 25 ug via INTRAVENOUS
  Administered 2015-10-30 (×2): 50 ug via INTRAVENOUS

## 2015-10-30 MED ORDER — METHOCARBAMOL 750 MG PO TABS: 750.0000 mg | ORAL_TABLET | Freq: Two times a day (BID) | ORAL | Status: DC | PRN

## 2015-10-30 MED ORDER — BUPIVACAINE-EPINEPHRINE (PF) 0.5% -1:200000 IJ SOLN
INTRAMUSCULAR | Status: DC | PRN
  Administered 2015-10-30: 25 mL via PERINEURAL

## 2015-10-30 MED ORDER — PROPOFOL 10 MG/ML IV BOLUS
INTRAVENOUS | Status: DC | PRN
  Administered 2015-10-30: 200 mg via INTRAVENOUS

## 2015-10-30 MED ORDER — ONDANSETRON HCL 4 MG/2ML IJ SOLN
INTRAMUSCULAR | Status: AC
  Filled 2015-10-30: qty 2

## 2015-10-30 MED ORDER — FENTANYL CITRATE (PF) 250 MCG/5ML IJ SOLN
INTRAMUSCULAR | Status: AC
  Filled 2015-10-30: qty 5

## 2015-10-30 MED ORDER — MIDAZOLAM HCL 5 MG/5ML IJ SOLN
INTRAMUSCULAR | Status: DC | PRN
  Administered 2015-10-30: 2 mg via INTRAVENOUS

## 2015-10-30 MED ORDER — ONDANSETRON HCL 4 MG/2ML IJ SOLN
4.0000 mg | Freq: Once | INTRAMUSCULAR | Status: AC
  Administered 2015-10-30: 4 mg via INTRAVENOUS

## 2015-10-30 MED ORDER — LACTATED RINGERS IV SOLN
INTRAVENOUS | Status: DC
  Administered 2015-10-30 (×2): via INTRAVENOUS

## 2015-10-30 MED ORDER — 0.9 % SODIUM CHLORIDE (POUR BTL) OPTIME
TOPICAL | Status: DC | PRN
  Administered 2015-10-30: 1000 mL

## 2015-10-30 MED ORDER — MIDAZOLAM HCL 2 MG/2ML IJ SOLN
INTRAMUSCULAR | Status: AC
  Filled 2015-10-30: qty 2

## 2015-10-30 MED ORDER — LIDOCAINE HCL (CARDIAC) 20 MG/ML IV SOLN
INTRAVENOUS | Status: DC | PRN
  Administered 2015-10-30: 80 mg via INTRAVENOUS

## 2015-10-30 MED ORDER — ONDANSETRON HCL 4 MG PO TABS: 4.0000 mg | ORAL_TABLET | Freq: Three times a day (TID) | ORAL | Status: DC | PRN

## 2015-10-30 MED ORDER — LIDOCAINE 2% (20 MG/ML) 5 ML SYRINGE
INTRAMUSCULAR | Status: AC
  Filled 2015-10-30: qty 5

## 2015-10-30 MED ORDER — FENTANYL CITRATE (PF) 100 MCG/2ML IJ SOLN
INTRAMUSCULAR | Status: AC
  Administered 2015-10-30: 25 ug via INTRAVENOUS
  Filled 2015-10-30: qty 2

## 2015-10-30 MED ORDER — SENNOSIDES-DOCUSATE SODIUM 8.6-50 MG PO TABS: 1.0000 | ORAL_TABLET | Freq: Every evening | ORAL | Status: DC | PRN

## 2015-10-30 MED ORDER — HYDROCODONE-ACETAMINOPHEN 7.5-325 MG PO TABS: 1.0000 | ORAL_TABLET | Freq: Once | ORAL | Status: DC | PRN

## 2015-10-30 MED ORDER — DEXAMETHASONE SODIUM PHOSPHATE 10 MG/ML IJ SOLN
INTRAMUSCULAR | Status: AC
  Filled 2015-10-30: qty 1

## 2015-10-30 SURGICAL SUPPLY — 55 items
BANDAGE ELASTIC 3 VELCRO ST LF (GAUZE/BANDAGES/DRESSINGS) ×3 IMPLANT
BANDAGE ELASTIC 4 VELCRO ST LF (GAUZE/BANDAGES/DRESSINGS) ×3 IMPLANT
BIT DRILL 2.5X2.75 QC CALB (BIT) ×2 IMPLANT
BNDG CMPR 9X4 STRL LF SNTH (GAUZE/BANDAGES/DRESSINGS) ×1
BNDG COHESIVE 4X5 TAN STRL (GAUZE/BANDAGES/DRESSINGS) ×3 IMPLANT
BNDG ESMARK 4X9 LF (GAUZE/BANDAGES/DRESSINGS) ×3 IMPLANT
BNDG GAUZE ELAST 4 BULKY (GAUZE/BANDAGES/DRESSINGS) ×3 IMPLANT
CORDS BIPOLAR (ELECTRODE) ×3 IMPLANT
COVER MAYO STAND STRL (DRAPES) ×3 IMPLANT
COVER SURGICAL LIGHT HANDLE (MISCELLANEOUS) ×3 IMPLANT
CUFF TOURNIQUET SINGLE 18IN (TOURNIQUET CUFF) ×3 IMPLANT
CUFF TOURNIQUET SINGLE 24IN (TOURNIQUET CUFF) IMPLANT
DRAPE INCISE IOBAN 66X45 STRL (DRAPES) ×3 IMPLANT
DRAPE OEC MINIVIEW 54X84 (DRAPES) IMPLANT
DRSG PAD ABDOMINAL 8X10 ST (GAUZE/BANDAGES/DRESSINGS) ×2 IMPLANT
GAUZE SPONGE 4X4 12PLY STRL (GAUZE/BANDAGES/DRESSINGS) ×3 IMPLANT
GAUZE XEROFORM 1X8 LF (GAUZE/BANDAGES/DRESSINGS) ×3 IMPLANT
GAUZE XEROFORM 5X9 LF (GAUZE/BANDAGES/DRESSINGS) ×2 IMPLANT
GLOVE BIOGEL M 8.0 STRL (GLOVE) ×3 IMPLANT
GLOVE SS BIOGEL STRL SZ 8 (GLOVE) ×1 IMPLANT
GLOVE SUPERSENSE BIOGEL SZ 8 (GLOVE) ×2
GOWN STRL REUS W/ TWL LRG LVL3 (GOWN DISPOSABLE) ×2 IMPLANT
GOWN STRL REUS W/ TWL XL LVL3 (GOWN DISPOSABLE) ×3 IMPLANT
GOWN STRL REUS W/TWL LRG LVL3 (GOWN DISPOSABLE) ×3
GOWN STRL REUS W/TWL XL LVL3 (GOWN DISPOSABLE) ×6
K-WIRE FIXATION 2.0X6 (WIRE) ×3
KIT BASIN OR (CUSTOM PROCEDURE TRAY) ×3 IMPLANT
KIT ROOM TURNOVER OR (KITS) ×3 IMPLANT
KWIRE FIXATION 2.0X6 (WIRE) IMPLANT
LOOP VESSEL MAXI BLUE (MISCELLANEOUS) IMPLANT
MANIFOLD NEPTUNE II (INSTRUMENTS) ×3 IMPLANT
NDL HYPO 25GX1X1/2 BEV (NEEDLE) IMPLANT
NEEDLE HYPO 25GX1X1/2 BEV (NEEDLE) IMPLANT
NEEDLE KEITH (NEEDLE) ×3 IMPLANT
NS IRRIG 1000ML POUR BTL (IV SOLUTION) ×3 IMPLANT
PACK ORTHO EXTREMITY (CUSTOM PROCEDURE TRAY) ×3 IMPLANT
PAD ARMBOARD 7.5X6 YLW CONV (MISCELLANEOUS) ×6 IMPLANT
PAD CAST 4YDX4 CTTN HI CHSV (CAST SUPPLIES) ×2 IMPLANT
PADDING CAST COTTON 4X4 STRL (CAST SUPPLIES) ×6
RETRIEVER SUT HEWSON (MISCELLANEOUS) ×2 IMPLANT
SOLUTION BETADINE 4OZ (MISCELLANEOUS) ×3 IMPLANT
SPECIMEN JAR SMALL (MISCELLANEOUS) ×1 IMPLANT
SPONGE SCRUB IODOPHOR (GAUZE/BANDAGES/DRESSINGS) ×3 IMPLANT
SUT ETHILON 3 0 PS 1 (SUTURE) ×2 IMPLANT
SUT PROLENE 3 0 PS 2 (SUTURE) IMPLANT
SUT VIC AB 2-0 CT1 27 (SUTURE) ×6
SUT VIC AB 2-0 CT1 TAPERPNT 27 (SUTURE) IMPLANT
SUT VIC AB 3-0 FS2 27 (SUTURE) ×3 IMPLANT
SYR CONTROL 10ML LL (SYRINGE) IMPLANT
TOWEL OR 17X24 6PK STRL BLUE (TOWEL DISPOSABLE) ×3 IMPLANT
TOWEL OR 17X26 10 PK STRL BLUE (TOWEL DISPOSABLE) ×6 IMPLANT
TUBE CONNECTING 12'X1/4 (SUCTIONS)
TUBE CONNECTING 12X1/4 (SUCTIONS) IMPLANT
UNDERPAD 30X30 INCONTINENT (UNDERPADS AND DIAPERS) ×3 IMPLANT
WATER STERILE IRR 1000ML POUR (IV SOLUTION) ×3 IMPLANT

## 2015-10-30 NOTE — Progress Notes (Signed)
Orthopedic Tech Progress Note Patient Details:  Jacob Benson Jan 04, 1986 [id number]  Ortho Devices Type of Ortho Device: Arm sling Ortho Device/Splint Location: lue Ortho Device/Splint Interventions: Application Viewed order from RN order list  Nikki DomCrawford, Kimberly Coye 10/30/2015, 3:41 PM

## 2015-10-30 NOTE — H&P (Signed)
PREOPERATIVE H&P  Chief Complaint: left olecranon fracture  HPI: Jacob Benson is a 30 y.o. male who presents for surgical treatment of left olecranon fracture.  He denies any changes in medical history.  Past Medical History  Diagnosis Date  . Hypertension   . Hyperlipidemia   . Calcium kidney stones 2007  . Olecranon fracture     left   Past Surgical History  Procedure Laterality Date  . Tonsillectomy    . Fracture surgery    . Orif humerus fracture Left 09/13/2015    Procedure: OPEN REDUCTION INTERNAL FIXATION (ORIF) ELBOW/OLECRANON AND I AND D;  Surgeon: Tarry KosNaiping M Morgana Rowley, MD;  Location: [facility] OR;  Service: Orthopedics;  Laterality: Left;  . I&d extremity Left 09/13/2015    Procedure: IRRIGATION AND DEBRIDEMENT EXTREMITY;  Surgeon: Tarry KosNaiping M Val Farnam, MD;  Location: [facility] OR;  Service: Orthopedics;  Laterality: Left;   Social History   Social History  . Marital Status: Married    Spouse Name: N/A  . Number of Children: N/A  . Years of Education: N/A   Social History Main Topics  . Smoking status: Former Smoker -- 0.50 packs/day    Types: Cigarettes    Quit date: 05/23/2013  . Smokeless tobacco: Former NeurosurgeonUser    Types: Snuff  . Alcohol Use: No  . Drug Use: No  . Sexual Activity: Not Asked   Other Topics Concern  . None   Social History Narrative   Family History  Problem Relation Age of Onset  . Hypertension Mother   . Hypertension Father   . Hypertension Sister   . Heart disease Maternal Grandmother   . CAD Maternal Grandmother     bypass surgery   Allergies  Allergen Reactions  . Guaifenesin & Derivatives Palpitations  . Oxycodone Nausea And Vomiting   Prior to Admission medications   Medication Sig Start Date End Date Taking? Authorizing Provider  azelastine (ASTELIN) 0.1 % nasal spray Place 2 sprays into both nostrils at bedtime. Use in each nostril as directed 08/20/15  Yes Ernestina Pennaonald W Moore, MD  HYDROcodone-acetaminophen (NORCO/VICODIN) 5-325 MG tablet Take 1-2  tablets by mouth every 4 (four) hours as needed. Patient taking differently: Take 1-2 tablets by mouth every 4 (four) hours as needed for moderate pain.  10/26/15  Yes Barrett HenleNicole Elizabeth Nadeau, PA-C  ibuprofen (ADVIL,MOTRIN) 200 MG tablet Take 600 mg by mouth 3 (three) times daily.   Yes Historical Provider, MD  loratadine (CLARITIN) 10 MG tablet Take 10 mg by mouth daily.   Yes Historical Provider, MD  meclizine (ANTIVERT) 25 MG tablet Take 1 tablet (25 mg total) by mouth 3 (three) times daily as needed for dizziness. 10/08/15  Yes Inis SizerWilliam L Webster, PA-C  omeprazole (PRILOSEC OTC) 20 MG tablet Take 20 mg by mouth daily.   Yes Historical Provider, MD  psyllium (METAMUCIL) 0.52 g capsule Take 0.52 g by mouth every morning.   Yes Historical Provider, MD  senna-docusate (SENOKOT S) 8.6-50 MG tablet Take 1 tablet by mouth at bedtime as needed. Patient taking differently: Take 1 tablet by mouth at bedtime as needed for mild constipation.  09/13/15  Yes Franklin Clapsaddle Donnelly StagerM Tabbitha Janvrin, MD     Positive ROS: All other systems have been reviewed and were otherwise negative with the exception of those mentioned in the HPI and as above.  Physical Exam: General: Alert, no acute distress Cardiovascular: No pedal edema Respiratory: No cyanosis, no use of accessory musculature GI: abdomen soft Skin: No lesions in  the area of chief complaint Neurologic: Sensation intact distally Psychiatric: Patient is competent for consent with normal mood and affect Lymphatic: no lymphedema  MUSCULOSKELETAL: exam stable  Assessment: left olecranon fracture  Plan: Plan for Procedure(s): OPEN REDUCTION INTERNAL FIXATION (ORIF) LEFT OLECRANON FRACTURE  The risks benefits and alternatives were discussed with the patient including but not limited to the risks of nonoperative treatment, versus surgical intervention including infection, bleeding, nerve injury,  blood clots, cardiopulmonary complications, morbidity, mortality, among others,  and they were willing to proceed.   Cheral Almas, MD   10/30/2015 11:11 AM

## 2015-10-30 NOTE — Discharge Instructions (Signed)
Postoperative instructions: ° °Weightbearing: non weight bearing ° °Dressing instructions: Keep your dressing and/or splint clean and dry at all times.  It will be removed at your first post-operative appointment.  Your stitches and/or staples will be removed at this visit. ° °Incision instructions:  Do not soak your incision for 3 weeks after surgery.  If the incision gets wet, pat dry and do not scrub the incision. ° °Pain control:  You have been given a prescription to be taken as directed for post-operative pain control.  In addition, elevate the operative extremity above the heart at all times to prevent swelling and throbbing pain. ° °Take over-the-counter Colace, 100mg by mouth twice a day while taking narcotic pain medications to help prevent constipation. ° °Follow up appointments: °1) 10-14 days for suture removal and wound check. °2) Dr. Zafira Munos as scheduled. ° ° ------------------------------------------------------------------------------------------------------------- ° °After Surgery Pain Control: ° °After your surgery, post-surgical discomfort or pain is likely. This discomfort can last several days to a few weeks. At certain times of the day your discomfort may be more intense.  °Did you receive a nerve block?  °A nerve block can provide pain relief for one hour to two days after your surgery. As long as the nerve block is working, you will experience little or no sensation in the area the surgeon operated on.  °As the nerve block wears off, you will begin to experience pain or discomfort. It is very important that you begin taking your prescribed pain medication before the nerve block fully wears off. Treating your pain at the first sign of the block wearing off will ensure your pain is better controlled and more tolerable when full-sensation returns. Do not wait until the pain is intolerable, as the medicine will be less effective. It is better to treat pain in advance than to try and catch up.    °General Anesthesia:  °If you did not receive a nerve block during your surgery, you will need to start taking your pain medication shortly after your surgery and should continue to do so as prescribed by your surgeon.  °Pain Medication:  °Most commonly we prescribe Vicodin and Percocet for post-operative pain. Both of these medications contain a combination of acetaminophen (Tylenol®) and a narcotic to help control pain.  °· It takes between 30 and 45 minutes before pain medication starts to work. It is important to take your medication before your pain level gets too intense.  °· Nausea is a common side effect of many pain medications. You will want to eat something before taking your pain medicine to help prevent nausea.  °· If you are taking a prescription pain medication that contains acetaminophen, we recommend that you do not take additional over the counter acetaminophen (Tylenol®).  °Other pain relieving options:  °· Using a cold pack to ice the affected area a few times a day (15 to 20 minutes at a time) can help to relieve pain, reduce swelling and bruising.  °· Elevation of the affected area can also help to reduce pain and swelling. ° ° ° °

## 2015-10-30 NOTE — Anesthesia Preprocedure Evaluation (Signed)
Anesthesia Evaluation  Patient identified by MRN, date of birth, ID band Patient awake    Reviewed: Allergy & Precautions, NPO status , Patient's Chart, lab work & pertinent test results  History of Anesthesia Complications Negative for: history of anesthetic complications  Airway Mallampati: I  TM Distance: >3 FB Neck ROM: Full    Dental  (+) Teeth Intact   Pulmonary neg shortness of breath, neg sleep apnea, neg COPD, neg recent URI, former smoker,    Pulmonary exam normal breath sounds clear to auscultation       Cardiovascular hypertension, (-) angina(-) Past MI and (-) CHF Normal cardiovascular exam Rhythm:Regular     Neuro/Psych negative neurological ROS  negative psych ROS   GI/Hepatic negative GI ROS, Neg liver ROS,   Endo/Other  negative endocrine ROS  Renal/GU      Musculoskeletal   Abdominal   Peds  Hematology negative hematology ROS (+)   Anesthesia Other Findings   Reproductive/Obstetrics                             Anesthesia Physical Anesthesia Plan  ASA: II  Anesthesia Plan: General and Regional   Post-op Pain Management:  Regional for Post-op pain   Induction: Intravenous  Airway Management Planned: LMA  Additional Equipment: None  Intra-op Plan:   Post-operative Plan: Extubation in OR  Informed Consent: I have reviewed the patients History and Physical, chart, labs and discussed the procedure including the risks, benefits and alternatives for the proposed anesthesia with the patient or authorized representative who has indicated his/her understanding and acceptance.   Dental advisory given  Plan Discussed with: CRNA and Surgeon  Anesthesia Plan Comments:         Anesthesia Quick Evaluation

## 2015-10-30 NOTE — Anesthesia Postprocedure Evaluation (Signed)
Anesthesia Post Note  Patient: Jacob Benson  Procedure(s) Performed: Procedure(s) (LRB): OPEN REDUCTION INTERNAL FIXATION (ORIF) LEFT OLECRANON FRACTURE (Left)  Patient location during evaluation: PACU Anesthesia Type: General and Regional Level of consciousness: awake and alert Pain management: pain level controlled Vital Signs Assessment: post-procedure vital signs reviewed and stable Respiratory status: spontaneous breathing, nonlabored ventilation, respiratory function stable and patient connected to nasal cannula oxygen Cardiovascular status: blood pressure returned to baseline and stable Postop Assessment: no signs of nausea or vomiting Anesthetic complications: no    Last Vitals:  Filed Vitals:   10/30/15 1454 10/30/15 1509  BP: 130/87 140/75  Pulse: 100 90  Temp:    Resp: 13 12    Last Pain:  Filed Vitals:   10/30/15 1520  PainSc: 5                  Reino KentJudd, Abijah Roussel J

## 2015-10-30 NOTE — Anesthesia Procedure Notes (Addendum)
Procedure Name: LMA Insertion Date/Time: 10/30/2015 1:01 PM Performed by: Army FossaPULLIAM, NICHOLAS DANE Pre-anesthesia Checklist: Patient identified, Emergency Drugs available, Suction available, Patient being monitored and Timeout performed Patient Re-evaluated:Patient Re-evaluated prior to inductionOxygen Delivery Method: Circle system utilized Preoxygenation: Pre-oxygenation with 100% oxygen Intubation Type: IV induction LMA: LMA inserted LMA Size: 4.0 Number of attempts: 1 Airway Equipment and Method: Patient positioned with wedge pillow Placement Confirmation: positive ETCO2,  CO2 detector and breath sounds checked- equal and bilateral Dental Injury: Teeth and Oropharynx as per pre-operative assessment    Anesthesia Regional Block:  Supraclavicular block  Pre-Anesthetic Checklist: ,, timeout performed, Correct Patient, Correct Site, Correct Laterality, Correct Procedure, Correct Position, site marked, Risks and benefits discussed,  Surgical consent,  Pre-op evaluation,  At surgeon's request and post-op pain management  Laterality: Upper and Left  Prep: chloraprep       Needles:  Injection technique: Single-shot  Needle Type: Echogenic Needle          Additional Needles:  Procedures: ultrasound guided (picture in chart) Supraclavicular block Narrative:  Injection made incrementally with aspirations every 5 mL.  Performed by: Personally   Additional Notes: H+P and labs reviewed, risks and benefits discussed with patient, procedure tolerated well without complications

## 2015-10-30 NOTE — Op Note (Signed)
   Date of Surgery: 10/30/2015  INDICATIONS: Mr. Jacob Benson is a 30 y.o.-year-old male with a left periprosthetic olecranon fracture from a mechanical fall;  The patient did consent to the procedure after discussion of the risks and benefits.  PREOPERATIVE DIAGNOSIS: Left periprosthetic olecranon fracture  POSTOPERATIVE DIAGNOSIS: Same.  PROCEDURE: Open reduction internal fixation of periprosthetic olecranon fracture  SURGEON: N. Glee ArvinMichael Kenshawn Maciolek, M.D.  ASSIST: Rexene EdisonGil Clark, PA. Necessary for the timely completion of the surgery and given the difficulty.  ANESTHESIA:  general, regional  IV FLUIDS AND URINE: See anesthesia.  ESTIMATED BLOOD LOSS: minimal mL.  IMPLANTS: #2 fiberwire sutures  DRAINS: none  COMPLICATIONS: None.  DESCRIPTION OF PROCEDURE: The patient was brought to the operating room and placed supine on the operating table.  The patient had been signed prior to the procedure and this was documented. The patient had the anesthesia placed by the anesthesiologist.  A time-out was performed to confirm that this was the correct patient, site, side and location. The patient did receive antibiotics prior to the incision and was re-dosed during the procedure as needed at indicated intervals.  A tourniquet placed and inflated to 250 mmHg.  The patient had the operative extremity prepped and draped in the standard surgical fashion.    A posterior incision over the elbow was again created. Full-thickness flaps were created. There were elevated both radially and ulnarly over the triceps and the proximal ulna. The fracture was exposed. The patient has sustained a periprosthetic fracture through the radial portion of the olecranon in a sagittal fashion. Organized hematoma was removed. The joint was irrigated. There was no signs of articular damage to the distal humerus. I then obtained a reduction of the fracture and this was held in place by 2 provisional K wires. The reduction was checked under  fluoroscopy. I then ran 2 #2 FiberWire sutures in a Krakw fashion up the distal triceps tendon. Given the orientation of the fracture and the lack of bone I was not able to fix the fracture with any hardware. I was able to fix the fracture by using the sutures in the triceps tendon by pulling it down and repairing it through drill holes and I made in the proximal ulna. A suture passer was used to deliver the suture through the drill holes. These were tied down tight. The K wires were removed. The fracture maintained reduction. Fluoroscopy was used again to confirm reduction. A second drill hole was used to back up the FiberWire repair. Another #2 FiberWire was used to repair the triceps insertion into the olecranon. The wound was then thoroughly irrigated with 4 L of normal saline. The wound was closed in a layered fashion using 2-0 Vicryl and 3-0 nylon. Sterile dressings were applied. A posterior splint was applied.  The patient tolerated the procedure well and no immediate complications.  POSTOPERATIVE PLAN: The patient will be nonweightbearing to the left upper extremity. He will remain in splint until his 2 week follow-up visit. He will be discharged home today.  Jacob ReelN. Michael Keena Dinse, MD [facility] [phone] 2:28 PM

## 2015-10-30 NOTE — Transfer of Care (Signed)
Immediate Anesthesia Transfer of Care Note  Patient: Jacob Benson  Procedure(s) Performed: Procedure(s): OPEN REDUCTION INTERNAL FIXATION (ORIF) LEFT OLECRANON FRACTURE (Left)  Patient Location: PACU  Anesthesia Type:General  Level of Consciousness:  sedated, patient cooperative and responds to stimulation  Airway & Oxygen Therapy:Patient Spontanous Breathing and Patient connected to nasal oxygen.   Post-op Assessment:  Report given to PACU RN and Post -op Vital signs reviewed and stable  Post vital signs:  Reviewed and stable  Last Vitals:  Filed Vitals:   10/30/15 1020  BP: 154/74  Pulse: 83  Temp: 37 C  Resp: 18    Complications: No apparent anesthesia complications

## 2015-10-31 ENCOUNTER — Encounter: Payer: 59 | Admitting: Physical Therapy

## 2015-10-31 ENCOUNTER — Encounter (HOSPITAL_COMMUNITY): Payer: Self-pay | Admitting: Orthopaedic Surgery

## 2015-11-14 DIAGNOSIS — S52022D Displaced fracture of olecranon process without intraarticular extension of left ulna, subsequent encounter for closed fracture with routine healing: Secondary | ICD-10-CM | POA: Diagnosis not present

## 2015-11-14 DIAGNOSIS — S52032A Displaced fracture of olecranon process with intraarticular extension of left ulna, initial encounter for closed fracture: Secondary | ICD-10-CM | POA: Diagnosis not present

## 2015-11-20 ENCOUNTER — Ambulatory Visit: Payer: 59 | Attending: Orthopaedic Surgery | Admitting: Physical Therapy

## 2015-11-20 DIAGNOSIS — M25522 Pain in left elbow: Secondary | ICD-10-CM | POA: Insufficient documentation

## 2015-11-20 DIAGNOSIS — M25622 Stiffness of left elbow, not elsewhere classified: Secondary | ICD-10-CM | POA: Insufficient documentation

## 2015-11-20 NOTE — Therapy (Signed)
[facility] [address] [place], [place], [address] Phone: [phone]   Fax:  [phone]  Physical Therapy Treatment  Patient Details  Name: Jacob FasterBrandon M More MRN: [id number] Date of Birth: 01-Jul-1985 Referring Provider: Gershon MusselNaiping Xu, MD  Encounter Date: 11/20/2015      PT End of Session - 11/20/15 1118    Visit Number 8   Number of Visits 20   Date for PT Re-Evaluation 12/18/15   PT Start Time 1035   PT Stop Time 1129   PT Time Calculation (min) 54 min   Activity Tolerance Patient tolerated treatment well   Behavior During Therapy [facility] for tasks assessed/performed      Past Medical History  Diagnosis Date  . Hypertension   . Hyperlipidemia   . Calcium kidney stones 2007  . Olecranon fracture     left    Past Surgical History  Procedure Laterality Date  . Tonsillectomy    . Fracture surgery    . Orif humerus fracture Left 09/13/2015    Procedure: OPEN REDUCTION INTERNAL FIXATION (ORIF) ELBOW/OLECRANON AND I AND D;  Surgeon: Tarry KosNaiping M Xu, MD;  Location: [facility] OR;  Service: Orthopedics;  Laterality: Left;  . I&d extremity Left 09/13/2015    Procedure: IRRIGATION AND DEBRIDEMENT EXTREMITY;  Surgeon: Tarry KosNaiping M Xu, MD;  Location: [facility] OR;  Service: Orthopedics;  Laterality: Left;  . Orif elbow fracture Left 10/30/2015    Procedure: OPEN REDUCTION INTERNAL FIXATION (ORIF) LEFT OLECRANON FRACTURE;  Surgeon: Tarry KosNaiping M Xu, MD;  Location: [facility] OR;  Service: Orthopedics;  Laterality: Left;    There were no vitals filed for this visit.      Subjective Assessment - 11/20/15 1038    Subjective Patient re-broke his arm on 10/25/15 and had surgery again on 10/30/15 during which MD removed two screws and then reattached his triceps. He is 3 weeks post surgery and RTD in 3 weeks.   Pertinent History HTN   Patient Stated Goals get back motion and strength   Currently in Pain? Yes   Pain Score 3    Pain Location Elbow   Pain Orientation Left   Pain Descriptors / Indicators Throbbing   Pain Type Surgical pain   Pain Onset 1 to 4 weeks ago   Pain Frequency Intermittent   Aggravating Factors  nothing   Pain Relieving Factors rest   Effect of Pain on Daily Activities cannot work            [facility] - 11/20/15 0001    Assessment   Medical Diagnosis s/p ORIF L olecranon   Onset Date/Surgical Date 10/30/15   Hand Dominance Left   Next MD Visit 12/12/15   Precautions   Precaution Comments PROM, AAROM only, NWB and no strengthening   Required Braces or Orthoses Other Brace/Splint  wear when out in community and he wears when sleeping   Restrictions   Weight Bearing Restrictions Yes   LUE Weight Bearing Non weight bearing   Balance Screen   Has the patient fallen in the past 6 months Yes   How many times? 1   Has the patient had a decrease in activity level because of a fear of falling?  No   Is the patient reluctant to leave their home because of a fear of falling?  No   ROM / Strength   AROM / PROM / Strength PROM   PROM   Overall PROM Comments 50 sup; 45 pronation   PROM Assessment  Site Elbow   Right/Left Elbow Left   Left Elbow Flexion 86   Left Elbow Extension 129   Left Wrist Extension 60 Degrees  50 active   Left Wrist Flexion 67 Degrees  60 active                     [facility] Adult PT Treatment/Exercise - 11/20/15 0001    Modalities   Modalities Electrical Stimulation   Electrical Stimulation   Electrical Stimulation Location L elbow   Electrical Stimulation Action premod   Electrical Stimulation Parameters 80-150 Hz to tolerance x 15 min   Electrical Stimulation Goals Pain;Tone   Manual Therapy   Manual Therapy Passive ROM   Passive ROM to left elbow for flex/ext                     PT Long Term Goals - 10/17/15 1018    PT LONG TERM GOAL #1   Title I with HEP   Time 4   Period Weeks   Status Achieved   PT LONG TERM GOAL #2   Title full L elbow flexion and  extension to restore function.   Time 4   Period Weeks   Status On-going   PT LONG TERM GOAL #3   Title 5/5 L elbow strength to allow RTW   Time 4   Period Weeks   Status On-going               Plan - 11/20/15 1129    Clinical Impression Statement Patient presents with decreased ROM since last visit due to repeat surgery and being in a cast for 2 weeks. He is limited to PROM and AAROM only. Goals are ongoing.   Rehab Potential Excellent   PT Frequency 3x / week   PT Duration 4 weeks   PT Treatment/Interventions ADLs/Self Care Home Management;Cryotherapy;Electrical Stimulation;Therapeutic exercise;Patient/family education;Manual techniques;Vasopneumatic Device;Passive range of motion;Neuromuscular re-education;Scar mobilization   PT Next Visit Plan cont per MD orders; PROM, AAROM, No strengthening, No WB.   Consulted and Agree with Plan of Care Patient      Patient will benefit from skilled therapeutic intervention in order to improve the following deficits and impairments:     Visit Diagnosis: Stiffness of left elbow, not elsewhere classified - Plan: PT plan of care cert/re-cert  Pain in left elbow - Plan: PT plan of care cert/re-cert     Problem List Patient Active Problem List   Diagnosis Date Noted  . S/P ORIF (open reduction internal fixation) fracture 09/13/2015  . HTN (hypertension) 10/17/2012    Solon PalmJulie Savannah Erbe PT  11/20/2015, 1:04 PM  [facility] [address] [place], [place], [address] Phone: [phone]   Fax:  [phone]  Name: Jacob Benson MRN: [id number] Date of Birth: May 12, 1986

## 2015-11-25 ENCOUNTER — Ambulatory Visit: Payer: 59 | Admitting: Physical Therapy

## 2015-11-25 DIAGNOSIS — M25522 Pain in left elbow: Secondary | ICD-10-CM

## 2015-11-25 DIAGNOSIS — M25622 Stiffness of left elbow, not elsewhere classified: Secondary | ICD-10-CM | POA: Diagnosis not present

## 2015-11-25 NOTE — Therapy (Signed)
[facility] [address] [place], [place], [address] Phone: [phone]   Fax:  [phone]  Physical Therapy Treatment  Patient Details  Name: [name] MRN: [id number] Date of Birth: Jan 26, 1986 Referring Provider: Gershon MusselNaiping Xu, MD  Encounter Date: 11/25/2015      PT End of Session - 11/25/15 1238    Visit Number 9   Number of Visits 20   Date for PT Re-Evaluation 12/18/15   PT Start Time 1030   PT Stop Time 1121   PT Time Calculation (min) 51 min      Past Medical History  Diagnosis Date  . Hypertension   . Hyperlipidemia   . Calcium kidney stones 2007  . Olecranon fracture     left    Past Surgical History  Procedure Laterality Date  . Tonsillectomy    . Fracture surgery    . Orif humerus fracture Left 09/13/2015    Procedure: OPEN REDUCTION INTERNAL FIXATION (ORIF) ELBOW/OLECRANON AND I AND D;  Surgeon: Tarry KosNaiping M Xu, MD;  Location: [facility] OR;  Service: Orthopedics;  Laterality: Left;  . I&d extremity Left 09/13/2015    Procedure: IRRIGATION AND DEBRIDEMENT EXTREMITY;  Surgeon: Tarry KosNaiping M Xu, MD;  Location: [facility] OR;  Service: Orthopedics;  Laterality: Left;  . Orif elbow fracture Left 10/30/2015    Procedure: OPEN REDUCTION INTERNAL FIXATION (ORIF) LEFT OLECRANON FRACTURE;  Surgeon: Tarry KosNaiping M Xu, MD;  Location: [facility] OR;  Service: Orthopedics;  Laterality: Left;    There were no vitals filed for this visit.      Subjective Assessment - 11/25/15 1238    Subjective I'm doing some gentle stretching at home to get my arm straight using a small can.   Pain Score 3    Pain Location Elbow   Pain Orientation Left   Pain Descriptors / Indicators Throbbing   Pain Type Surgical pain   Pain Onset 1 to 4 weeks ago                         [facility] Adult PT Treatment/Exercise - 11/25/15 0001    Modalities   Modalities Electrical Stimulation   Electrical Stimulation   Electrical Stimulation Location L elbow.   Electrical Stimulation Action Pre-mod with moist heat.   Electrical Stimulation Parameters 80-150 Hz x 15 minutes.   Manual Therapy   Manual Therapy Passive ROM   Passive ROM Low load long duration stretching with STW/M with emphasis on left elbow extension.  PROM also into left elbow flexion and forearm supination.                     PT Long Term Goals - 10/17/15 1018    PT LONG TERM GOAL #1   Title I with HEP   Time 4   Period Weeks   Status Achieved   PT LONG TERM GOAL #2   Title full L elbow flexion and extension to restore function.   Time 4   Period Weeks   Status On-going   PT LONG TERM GOAL #3   Title 5/5 L elbow strength to allow RTW   Time 4   Period Weeks   Status On-going             Patient will benefit from skilled therapeutic intervention in order to improve the following deficits and impairments:     Visit Diagnosis: Stiffness of left elbow, not elsewhere classified  Pain in left elbow  Problem List Patient Active Problem List   Diagnosis Date Noted  . S/P ORIF (open reduction internal fixation) fracture 09/13/2015  . HTN (hypertension) 10/17/2012    [place], [place] MPT 11/25/2015, 12:42 PM  [facility] [address] [place], [place], [address] Phone: [phone]   Fax:  [phone]  Name: Jacob Benson MRN: [id number] Date of Birth: 1986-01-23

## 2015-11-26 ENCOUNTER — Ambulatory Visit: Payer: 59 | Admitting: Physical Therapy

## 2015-11-26 ENCOUNTER — Encounter: Payer: Self-pay | Admitting: Physical Therapy

## 2015-11-26 DIAGNOSIS — M25622 Stiffness of left elbow, not elsewhere classified: Secondary | ICD-10-CM | POA: Diagnosis not present

## 2015-11-26 DIAGNOSIS — M25522 Pain in left elbow: Secondary | ICD-10-CM

## 2015-11-26 NOTE — Therapy (Signed)
[facility] [address] [place], [place], [address] Phone: [phone]   Fax:  [phone]  Physical Therapy Treatment  Patient Details  Name: Jacob Benson MRN: [id number] Date of Birth: 1985/09/03 Referring Provider: Gershon MusselNaiping Xu, MD  Encounter Date: 11/26/2015      PT End of Session - 11/26/15 1116    Visit Number 10   Number of Visits 20   Date for PT Re-Evaluation 12/18/15   PT Start Time 1115   PT Stop Time 1142   PT Time Calculation (min) 27 min   Activity Tolerance Patient tolerated treatment well   Behavior During Therapy [facility] for tasks assessed/performed      Past Medical History  Diagnosis Date  . Hypertension   . Hyperlipidemia   . Calcium kidney stones 2007  . Olecranon fracture     left    Past Surgical History  Procedure Laterality Date  . Tonsillectomy    . Fracture surgery    . Orif humerus fracture Left 09/13/2015    Procedure: OPEN REDUCTION INTERNAL FIXATION (ORIF) ELBOW/OLECRANON AND I AND D;  Surgeon: Tarry KosNaiping M Xu, MD;  [facility];  Service: Orthopedics;  Laterality: Left;  . I&d extremity Left 09/13/2015    Procedure: IRRIGATION AND DEBRIDEMENT EXTREMITY;  Surgeon: Tarry KosNaiping M Xu, MD;  [facility];  Service: Orthopedics;  Laterality: Left;  . Orif elbow fracture Left 10/30/2015    Procedure: OPEN REDUCTION INTERNAL FIXATION (ORIF) LEFT OLECRANON FRACTURE;  Surgeon: Tarry KosNaiping M Xu, MD;  Location: [facility] OR;  Service: Orthopedics;  Laterality: Left;    There were no vitals filed for this visit.      Subjective Assessment - 11/26/15 1115    Subjective Reports that L elbow doesn't hurt real bad and has a very intermittant sharp pain at end of the incision.   Pertinent History HTN   Patient Stated Goals get back motion and strength   Currently in Pain? No/denies            [facility] PT Assessment - 11/26/15 0001    Assessment   Medical Diagnosis s/p ORIF L olecranon   Onset Date/Surgical Date  10/30/15   Hand Dominance Left   Next MD Visit 12/12/15   Precautions   Precaution Comments PROM, AAROM only, NWB and no strengthening   Required Braces or Orthoses Other Brace/Splint   Restrictions   Weight Bearing Restrictions Yes   LUE Weight Bearing Non weight bearing                     [facility] Adult PT Treatment/Exercise - 11/26/15 0001    Manual Therapy   Manual Therapy Passive ROM   Passive ROM Gentle PROM of L elbow into flexion/ext and L forearm into supination/pronation in hooklying supine                     PT Long Term Goals - 10/17/15 1018    PT LONG TERM GOAL #1   Title I with HEP   Time 4   Period Weeks   Status Achieved   PT LONG TERM GOAL #2   Title full L elbow flexion and extension to restore function.   Time 4   Period Weeks   Status On-going   PT LONG TERM GOAL #3   Title 5/5 L elbow strength to allow RTW   Time 4   Period Weeks   Status On-going  Plan - 11/26/15 1152    Clinical Impression Statement Patient presented in clinic with no L elbow pain although he experiences very intermittant stabbing pain just below incision per patient report. Gentle PROM completed to L elbow into flexion and extension as well as the L forearm into supination and pronation. Very firm end feels noted with PROM into all directions assessed with limitations at end range for both L elbow flexion and extension. Patient denied any pain at end range extension as before his second surgery he had experienced end range discomfort with extension. Patient denied modalities today as he had no pain.    Rehab Potential Excellent   PT Frequency 3x / week   PT Duration 4 weeks   PT Treatment/Interventions ADLs/Self Care Home Management;Cryotherapy;Electrical Stimulation;Therapeutic exercise;Patient/family education;Manual techniques;Vasopneumatic Device;Passive range of motion;Neuromuscular re-education;Scar mobilization   PT Next Visit Plan  cont per MD orders; PROM, AAROM, No strengthening, No WB.   PT Home Exercise Plan scar massage, AROM wrist and hand.   Consulted and Agree with Plan of Care Patient      Patient will benefit from skilled therapeutic intervention in order to improve the following deficits and impairments:  Decreased range of motion, Pain, Increased edema, Decreased strength  Visit Diagnosis: Stiffness of left elbow, not elsewhere classified  Pain in left elbow     Problem List Patient Active Problem List   Diagnosis Date Noted  . S/P ORIF (open reduction internal fixation) fracture 09/13/2015  . HTN (hypertension) 10/17/2012    Evelene Croon, PTA 11/26/2015, 11:59 AM  [facility] [address] [place], [place], [address] Phone: [phone]   Fax:  [phone]  Name: Jacob Benson MRN: [id number] Date of Birth: 14-Feb-1986

## 2015-11-28 ENCOUNTER — Ambulatory Visit: Payer: 59 | Admitting: *Deleted

## 2015-11-28 DIAGNOSIS — M25522 Pain in left elbow: Secondary | ICD-10-CM

## 2015-11-28 DIAGNOSIS — M25622 Stiffness of left elbow, not elsewhere classified: Secondary | ICD-10-CM | POA: Diagnosis not present

## 2015-11-28 NOTE — Therapy (Signed)
[facility] [address] [place], [place], [address] Phone: [phone]   Fax:  [phone]  Physical Therapy Treatment  Patient Details  Name: Jacob Benson MRN: [id number] Date of Birth: Oct 14, 1985 Referring Provider: Gershon MusselNaiping Xu, MD  Encounter Date: 11/28/2015      PT End of Session - 11/28/15 1215    Visit Number 11   Number of Visits 20   Date for PT Re-Evaluation 12/18/15   PT Start Time 1115   PT Stop Time 1204   PT Time Calculation (min) 49 min      Past Medical History  Diagnosis Date  . Hypertension   . Hyperlipidemia   . Calcium kidney stones 2007  . Olecranon fracture     left    Past Surgical History  Procedure Laterality Date  . Tonsillectomy    . Fracture surgery    . Orif humerus fracture Left 09/13/2015    Procedure: OPEN REDUCTION INTERNAL FIXATION (ORIF) ELBOW/OLECRANON AND I AND D;  Surgeon: Tarry KosNaiping M Xu, MD;  Location: [facility] OR;  Service: Orthopedics;  Laterality: Left;  . I&d extremity Left 09/13/2015    Procedure: IRRIGATION AND DEBRIDEMENT EXTREMITY;  Surgeon: Tarry KosNaiping M Xu, MD;  Location: [facility] OR;  Service: Orthopedics;  Laterality: Left;  . Orif elbow fracture Left 10/30/2015    Procedure: OPEN REDUCTION INTERNAL FIXATION (ORIF) LEFT OLECRANON FRACTURE;  Surgeon: Tarry KosNaiping M Xu, MD;  Location: [facility] OR;  Service: Orthopedics;  Laterality: Left;    There were no vitals filed for this visit.      Subjective Assessment - 11/28/15 1214    Subjective Reports that L elbow doesn't hurt real bad and has a very intermittant sharp pain at end of the incision.   Pertinent History HTN   Patient Stated Goals get back motion and strength   Currently in Pain? No/denies   Pain Location Elbow   Pain Orientation Left   Pain Descriptors / Indicators Throbbing   Pain Type Surgical pain   Pain Onset 1 to 4 weeks ago   Pain Frequency Intermittent                         [facility] Adult PT  Treatment/Exercise - 11/28/15 0001    Manual Therapy   Manual Therapy Passive ROM;Soft tissue mobilization;Myofascial release   Myofascial Release STW and IASTM to LT elbow around incision and to bicep   Passive ROM Gentle PROM of L elbow into flexion/ext and L forearm into supination/pronation in hooklying supine                     PT Long Term Goals - 10/17/15 1018    PT LONG TERM GOAL #1   Title I with HEP   Time 4   Period Weeks   Status Achieved   PT LONG TERM GOAL #2   Title full L elbow flexion and extension to restore function.   Time 4   Period Weeks   Status On-going   PT LONG TERM GOAL #3   Title 5/5 L elbow strength to allow RTW   Time 4   Period Weeks   Status On-going               Plan - 11/28/15 1216    Clinical Impression Statement Pt did fairly well today with RX. He arrived  to clinic today with minimal c/o pain, but mainly tightness in L elbow. We performed PROM /  AAROM for LT elbow flexion and extension and needs V/C's to relax with extension. He was limited still in all motions, but feels he is progressing. STW was performed around incision and along bicep to aid in ROM.     Rehab Potential Excellent   PT Frequency 3x / week   PT Duration 4 weeks   PT Treatment/Interventions ADLs/Self Care Home Management;Cryotherapy;Electrical Stimulation;Therapeutic exercise;Patient/family education;Manual techniques;Vasopneumatic Device;Passive range of motion;Neuromuscular re-education;Scar mobilization   PT Next Visit Plan cont per MD orders; PROM, AAROM, No strengthening, No WB.   PT Home Exercise Plan scar massage, AROM wrist and hand.   Consulted and Agree with Plan of Care Patient      Patient will benefit from skilled therapeutic intervention in order to improve the following deficits and impairments:  Decreased range of motion, Pain, Increased edema, Decreased strength  Visit Diagnosis: Stiffness of left elbow, not elsewhere  classified  Pain in left elbow     Problem List Patient Active Problem List   Diagnosis Date Noted  . S/P ORIF (open reduction internal fixation) fracture 09/13/2015  . HTN (hypertension) 10/17/2012    Rosalyn Archambault,CHRIS, PTA 11/28/2015, 12:24 PM  [facility] [address] [place], [place], [address] Phone: [phone]   Fax:  [phone]  Name: Jacob Benson MRN: [id number] Date of Birth: August 16, 1985

## 2015-12-02 ENCOUNTER — Ambulatory Visit: Payer: 59 | Admitting: Physical Therapy

## 2015-12-02 DIAGNOSIS — M25622 Stiffness of left elbow, not elsewhere classified: Secondary | ICD-10-CM | POA: Diagnosis not present

## 2015-12-02 DIAGNOSIS — M25522 Pain in left elbow: Secondary | ICD-10-CM

## 2015-12-02 NOTE — Therapy (Signed)
[facility] [address] [place], [place], [address] Phone: [phone]   Fax:  [phone]  Physical Therapy Treatment  Patient Details  Name: Jacob Benson MRN: [id number] Date of Birth: 1986-01-14 Referring Provider: Gershon MusselNaiping Xu, MD  Encounter Date: 12/02/2015      PT End of Session - 12/02/15 1117    Visit Number 12   Number of Visits 20   Date for PT Re-Evaluation 12/18/15   PT Start Time 1116   PT Stop Time 1212   PT Time Calculation (min) 56 min   Activity Tolerance Patient tolerated treatment well   Behavior During Therapy [facility] for tasks assessed/performed      Past Medical History  Diagnosis Date  . Hypertension   . Hyperlipidemia   . Calcium kidney stones 2007  . Olecranon fracture     left    Past Surgical History  Procedure Laterality Date  . Tonsillectomy    . Fracture surgery    . Orif humerus fracture Left 09/13/2015    Procedure: OPEN REDUCTION INTERNAL FIXATION (ORIF) ELBOW/OLECRANON AND I AND D;  Surgeon: Tarry KosNaiping M Xu, MD;  Location[facility] OR;  Service: Orthopedics;  Laterality: Left;  . I&d extremity Left 09/13/2015    Procedure: IRRIGATION AND DEBRIDEMENT EXTREMITY;  Surgeon: Tarry KosNaiping M Xu, MD;  [facility] OR;  Service: Orthopedics;  Laterality: Left;  . Orif elbow fracture Left 10/30/2015    Procedure: OPEN REDUCTION INTERNAL FIXATION (ORIF) LEFT OLECRANON FRACTURE;  Surgeon: Tarry KosNaiping M Xu, MD;  [facility];  Service: Orthopedics;  Laterality: Left;    There were no vitals filed for this visit.      Subjective Assessment - 12/02/15 1117    Subjective No new complaints.   Pertinent History HTN   Patient Stated Goals get back motion and strength   Currently in Pain? No/denies            [facility] - 12/02/15 0001    ROM / Strength   AROM / PROM / Strength PROM   PROM   Overall PROM Comments 71 sup; 51 pro   Left Elbow Flexion 107   Left Elbow Extension 140                      [facility] Adult PT Treatment/Exercise - 12/02/15 0001    Modalities   Modalities Electrical Stimulation   Electrical Stimulation   Electrical Stimulation Location L elbow   Electrical Stimulation Action premod   Electrical Stimulation Parameters 80-150 Hz to tolerance x 15 min   Electrical Stimulation Goals Other (comment)  patient reports pain post PT   Manual Therapy   Manual Therapy Soft tissue mobilization;Myofascial release;Passive ROM   Soft tissue mobilization To L forearm near incision, biceps and triceps   Myofascial Release around incision   Passive ROM PROM into flex and extension, forearm pronation and supination                     PT Long Term Goals - 12/02/15 1208    PT LONG TERM GOAL #1   Title I with HEP   Time 4   Period Weeks   Status Achieved   PT LONG TERM GOAL #2   Title full L elbow flexion and extension to restore function.   Time 4   Status On-going   PT LONG TERM GOAL #3   Title 5/5 L elbow strength to allow RTW   Time 4  Period Weeks   Status On-going               Plan - 12/02/15 1205    Clinical Impression Statement Patient did well with therapy today. He is progressing with ROM, but is still significantly limtied in flex and ext. He tolerated deep STW and MFR and had noticeable increase in ROM following it. PT discussed use of tennis ball or rolling pin to triceps to help relieve trigger points in the muscles. Patient reports that he has increased pain/soreness after PT, so incorporated estim today to see if it helps alleviate pain.   Rehab Potential Excellent   PT Frequency 3x / week   PT Duration 4 weeks   PT Treatment/Interventions ADLs/Self Care Home Management;Cryotherapy;Electrical Stimulation;Therapeutic exercise;Patient/family education;Manual techniques;Vasopneumatic Device;Passive range of motion;Neuromuscular re-education;Scar mobilization   PT Next Visit Plan cont per MD orders; PROM,  AAROM, No strengthening, No WB. Return to MD 12/12/15.   Consulted and Agree with Plan of Care Patient      Patient will benefit from skilled therapeutic intervention in order to improve the following deficits and impairments:  Decreased range of motion, Pain, Increased edema, Decreased strength  Visit Diagnosis: Stiffness of left elbow, not elsewhere classified  Pain in left elbow     Problem List Patient Active Problem List   Diagnosis Date Noted  . S/P ORIF (open reduction internal fixation) fracture 09/13/2015  . HTN (hypertension) 10/17/2012    Solon Palm PT  12/02/2015, 12:10 PM  [facility] [address] [place], [place], [address] Phone: [phone]   Fax:  [phone]  Name: Jacob Benson MRN: [id number] Date of Birth: November 07, 1985

## 2015-12-05 ENCOUNTER — Ambulatory Visit: Payer: 59 | Admitting: Physical Therapy

## 2015-12-05 DIAGNOSIS — M25522 Pain in left elbow: Secondary | ICD-10-CM

## 2015-12-05 DIAGNOSIS — M25622 Stiffness of left elbow, not elsewhere classified: Secondary | ICD-10-CM | POA: Diagnosis not present

## 2015-12-05 NOTE — Therapy (Signed)
[facility] [address] [place], [place], [address] Phone: [phone]   Fax:  [phone]  Physical Therapy Treatment  Patient Details  Name: Jacob Benson MRN: [id number] Date of Birth: 07-Apr-1986 Referring Provider: Gershon Mussel, MD  Encounter Date: 12/05/2015      PT End of Session - 12/05/15 1032    Visit Number 13   Number of Visits 20   Date for PT Re-Evaluation 12/18/15   PT Start Time 1032   PT Stop Time 1059  2 units secondary to denial of modalities   PT Time Calculation (min) 27 min   Activity Tolerance Patient tolerated treatment well   Behavior During Therapy [facility] for tasks assessed/performed      Past Medical History  Diagnosis Date  . Hypertension   . Hyperlipidemia   . Calcium kidney stones 2007  . Olecranon fracture     left    Past Surgical History  Procedure Laterality Date  . Tonsillectomy    . Fracture surgery    . Orif humerus fracture Left 09/13/2015    Procedure: OPEN REDUCTION INTERNAL FIXATION (ORIF) ELBOW/OLECRANON AND I AND D;  Surgeon: Tarry Kos, MD;  [facility];  Service: Orthopedics;  Laterality: Left;  . I&d extremity Left 09/13/2015    Procedure: IRRIGATION AND DEBRIDEMENT EXTREMITY;  Surgeon: Tarry Kos, MD;  [facility];  Service: Orthopedics;  Laterality: Left;  . Orif elbow fracture Left 10/30/2015    Procedure: OPEN REDUCTION INTERNAL FIXATION (ORIF) LEFT OLECRANON FRACTURE;  Surgeon: Tarry Kos, MD;  [facility];  Service: Orthopedics;  Laterality: Left;    There were no vitals filed for this visit.      Subjective Assessment - 12/05/15 1152    Subjective Reports that manual therapy during last treatment hurt some. Reports he is trying to stretch out elbow at home.   Pertinent History HTN   Patient Stated Goals get back motion and strength   Currently in Pain? No/denies            [facility] PT Assessment - 12/05/15 0001    Assessment   Medical Diagnosis  s/p ORIF L olecranon   Onset Date/Surgical Date 10/30/15   Hand Dominance Left   Next MD Visit 12/12/15   Precautions   Precaution Comments PROM, AAROM only, NWB and no strengthening   Required Braces or Orthoses Other Brace/Splint   Restrictions   Weight Bearing Restrictions Yes   LUE Weight Bearing Non weight bearing                     [facility] Adult PT Treatment/Exercise - 12/05/15 0001    Manual Therapy   Manual Therapy Passive ROM;Soft tissue mobilization   Soft tissue mobilization STW to L Bicep to decrease tightness   Passive ROM PROM of L elbow in flex/ext, forearm supination/pronation to improve ROM                     PT Long Term Goals - 12/02/15 1208    PT LONG TERM GOAL #1   Title I with HEP   Time 4   Period Weeks   Status Achieved   PT LONG TERM GOAL #2   Title full L elbow flexion and extension to restore function.   Time 4   Status On-going   PT LONG TERM GOAL #3   Title 5/5 L elbow strength to allow RTW   Time 4  Period Weeks   Status On-going               Plan - 12/05/15 1156    Clinical Impression Statement Patient tolerated today's treatment well with no pain reported during PROM only soreness with PROM of L elbow into flexion/extension. Minimal increased tightness noted in L Bicep today. Firm end feels noted with L elbow flex/ext as well as forearm supination/pronation with smooth arc of motion. Patient denied modalities today as he denied pain. Goals remain on-going secondary to protocol limitations.   Rehab Potential Excellent   PT Frequency 3x / week   PT Duration 4 weeks   PT Treatment/Interventions ADLs/Self Care Home Management;Cryotherapy;Electrical Stimulation;Therapeutic exercise;Patient/family education;Manual techniques;Vasopneumatic Device;Passive range of motion;Neuromuscular re-education;Scar mobilization   PT Next Visit Plan cont per MD orders; PROM, AAROM, No strengthening, No WB. Return to MD 12/12/15.    PT Home Exercise Plan scar massage, AROM wrist and hand.   Consulted and Agree with Plan of Care Patient      Patient will benefit from skilled therapeutic intervention in order to improve the following deficits and impairments:  Decreased range of motion, Pain, Increased edema, Decreased strength  Visit Diagnosis: Stiffness of left elbow, not elsewhere classified  Pain in left elbow     Problem List Patient Active Problem List   Diagnosis Date Noted  . S/P ORIF (open reduction internal fixation) fracture 09/13/2015  . HTN (hypertension) 10/17/2012    Evelene CroonKelsey M Parsons, PTA 12/05/2015, 11:59 AM  [facility] [address] [place], [place], [address] Phone: [phone]   Fax:  [phone]  Name: Jacob Benson MRN: [id number] Date of Birth: [birth year]

## 2015-12-08 ENCOUNTER — Ambulatory Visit: Payer: 59 | Admitting: Physical Therapy

## 2015-12-08 ENCOUNTER — Encounter: Payer: Self-pay | Admitting: Physical Therapy

## 2015-12-08 DIAGNOSIS — M25622 Stiffness of left elbow, not elsewhere classified: Secondary | ICD-10-CM

## 2015-12-08 DIAGNOSIS — M25522 Pain in left elbow: Secondary | ICD-10-CM | POA: Diagnosis not present

## 2015-12-08 NOTE — Therapy (Signed)
[facility] [address] [place], [place], [address] Phone: [phone]   Fax:  [phone]  Physical Therapy Treatment  Patient Details  Name: Jacob Benson MRN: [id number] Date of Birth: [birth year] Referring Provider: Gershon Mussel, MD  Encounter Date: 12/08/2015      PT End of Session - 12/10/15 1155    Visit Number 15   Number of Visits 20   Date for PT Re-Evaluation 12/18/15   PT Start Time 1116   PT Stop Time 1156   PT Time Calculation (min) 40 min   Activity Tolerance Patient tolerated treatment well   Behavior During Therapy [facility] for tasks assessed/performed      Past Medical History:  Diagnosis Date  . Calcium kidney stones 2007  . Hyperlipidemia   . Hypertension   . Olecranon fracture    left    Past Surgical History:  Procedure Laterality Date  . FRACTURE SURGERY    . I&D EXTREMITY Left 09/13/2015   Procedure: IRRIGATION AND DEBRIDEMENT EXTREMITY;  Surgeon: Tarry Kos, MD;  [facility];  Service: Orthopedics;  Laterality: Left;  . ORIF ELBOW FRACTURE Left 10/30/2015   Procedure: OPEN REDUCTION INTERNAL FIXATION (ORIF) LEFT OLECRANON FRACTURE;  Surgeon: Tarry Kos, MD;  [facility];  Service: Orthopedics;  Laterality: Left;  . ORIF HUMERUS FRACTURE Left 09/13/2015   Procedure: OPEN REDUCTION INTERNAL FIXATION (ORIF) ELBOW/OLECRANON AND I AND D;  Surgeon: Tarry Kos, MD;  [facility];  Service: Orthopedics;  Laterality: Left;  . TONSILLECTOMY      There were no vitals filed for this visit.      Subjective Assessment - 12/10/15 1120    Subjective Patient progressing with reports of no pain and doing his stretches daily. Patient reported ice really helps him at home with ROM   Pertinent History HTN   Patient Stated Goals get back motion and strength   Currently in Pain? No/denies                                      PT Long Term Goals - 12/11/15 1106      PT  LONG TERM GOAL #1   Title I with HEP   Time 4   Period Weeks   Status Achieved     PT LONG TERM GOAL #2   Title Improved L elbow flexion to 140 deg and extension to 155 deg or better to improve function.   Time 4   Period Weeks   Status Revised     PT LONG TERM GOAL #3   Title 5/5 L elbow strength to allow RTW   Time 4   Period Weeks   Status On-going               Plan - 12/10/15 1156    Clinical Impression Statement Patient progressing with no pain complaints today and improved ROM in left elbow for both flexion and ext. Patient continues to perform light ADL's such as brushing teeth and eating with greater ease. Patient goals ongoing today due to ROM deficits.    Rehab Potential Excellent   PT Frequency 3x / week   PT Duration 4 weeks   PT Treatment/Interventions ADLs/Self Care Home Management;Cryotherapy;Electrical Stimulation;Therapeutic exercise;Patient/family education;Manual techniques;Vasopneumatic Device;Passive range of motion;Neuromuscular re-education;Scar mobilization   PT Next Visit Plan cont per MD orders; PROM, AAROM, No strengthening, No WB. Return to  MD 12/12/15.   Consulted and Agree with Plan of Care Patient      Patient will benefit from skilled therapeutic intervention in order to improve the following deficits and impairments:  Decreased range of motion, Pain, Increased edema, Decreased strength  Visit Diagnosis: Stiffness of left elbow, not elsewhere classified  Pain in left elbow     Problem List Patient Active Problem List   Diagnosis Date Noted  . S/P ORIF (open reduction internal fixation) fracture 09/13/2015  . HTN (hypertension) 10/17/2012    Solon Palm PT 12/11/2015, 11:09 AM   Cathie Hoops, PTA 12/08/15 12:10 PM   [facility] [address] [place], [place], [address] Phone: [phone]   Fax:  [phone]  Name: Jacob Benson MRN: [id number] Date of Birth:  1985/09/04

## 2015-12-10 ENCOUNTER — Encounter: Payer: Self-pay | Admitting: Physical Therapy

## 2015-12-10 ENCOUNTER — Ambulatory Visit: Payer: 59 | Admitting: Physical Therapy

## 2015-12-10 DIAGNOSIS — M25522 Pain in left elbow: Secondary | ICD-10-CM | POA: Diagnosis not present

## 2015-12-10 DIAGNOSIS — M25622 Stiffness of left elbow, not elsewhere classified: Secondary | ICD-10-CM

## 2015-12-10 NOTE — Therapy (Signed)
[facility] [address] [place], [place], [address] Phone: [phone]   Fax:  [phone]  Physical Therapy Treatment  Patient Details  Name: Jacob Benson MRN: [id number] Date of Birth: 1985/07/17 Referring Provider: Gershon Mussel, MD  Encounter Date: 12/10/2015      PT End of Session - 12/10/15 1155    Visit Number 15   Number of Visits 20   Date for PT Re-Evaluation 12/18/15   PT Start Time 1116   PT Stop Time 1156   PT Time Calculation (min) 40 min   Activity Tolerance Patient tolerated treatment well   Behavior During Therapy [facility] for tasks assessed/performed      Past Medical History:  Diagnosis Date  . Calcium kidney stones 2007  . Hyperlipidemia   . Hypertension   . Olecranon fracture    left    Past Surgical History:  Procedure Laterality Date  . FRACTURE SURGERY    . I&D EXTREMITY Left 09/13/2015   Procedure: IRRIGATION AND DEBRIDEMENT EXTREMITY;  Surgeon: Tarry Kos, MD;  [facility];  Service: Orthopedics;  Laterality: Left;  . ORIF ELBOW FRACTURE Left 10/30/2015   Procedure: OPEN REDUCTION INTERNAL FIXATION (ORIF) LEFT OLECRANON FRACTURE;  Surgeon: Tarry Kos, MD;  [facility];  Service: Orthopedics;  Laterality: Left;  . ORIF HUMERUS FRACTURE Left 09/13/2015   Procedure: OPEN REDUCTION INTERNAL FIXATION (ORIF) ELBOW/OLECRANON AND I AND D;  Surgeon: Tarry Kos, MD;  [facility];  Service: Orthopedics;  Laterality: Left;  . TONSILLECTOMY      There were no vitals filed for this visit.      Subjective Assessment - 12/10/15 1120    Subjective Patient progressing with reports of no pain and doing his stretches daily. Patient reported ice really helps him at home with ROM   Pertinent History HTN   Patient Stated Goals get back motion and strength   Currently in Pain? No/denies            [facility] PT Assessment - 12/10/15 0001      PROM   PROM Assessment Site Elbow   Left Elbow Flexion  115   Left Elbow Extension -31                     [facility] Adult PT Treatment/Exercise - 12/10/15 0001      Manual Therapy   Manual Therapy Passive ROM;Soft tissue mobilization   Passive ROM PROM of L elbow in flex/ext, forearm supination/pronation to improve ROM                     PT Long Term Goals - 12/02/15 1208      PT LONG TERM GOAL #1   Title I with HEP   Time 4   Period Weeks   Status Achieved     PT LONG TERM GOAL #2   Title full L elbow flexion and extension to restore function.   Time 4   Status On-going     PT LONG TERM GOAL #3   Title 5/5 L elbow strength to allow RTW   Time 4   Period Weeks   Status On-going               Plan - 12/10/15 1156    Clinical Impression Statement Patient progressing with no pain complaints today and improved ROM in left elbow for both flexion and ext. Patient continues to perform light ADL's such as brushing  teeth and eating with greater ease. Patient goals ongoing today due to ROM deficits.    Rehab Potential Excellent   PT Frequency 3x / week   PT Duration 4 weeks   PT Treatment/Interventions ADLs/Self Care Home Management;Cryotherapy;Electrical Stimulation;Therapeutic exercise;Patient/family education;Manual techniques;Vasopneumatic Device;Passive range of motion;Neuromuscular re-education;Scar mobilization   PT Next Visit Plan cont per MD orders; PROM, AAROM, No strengthening, No WB. Return to MD 12/12/15.   Consulted and Agree with Plan of Care Patient      Patient will benefit from skilled therapeutic intervention in order to improve the following deficits and impairments:  Decreased range of motion, Pain, Increased edema, Decreased strength  Visit Diagnosis: Stiffness of left elbow, not elsewhere classified  Pain in left elbow     Problem List Patient Active Problem List   Diagnosis Date Noted  . S/P ORIF (open reduction internal fixation) fracture 09/13/2015  . HTN  (hypertension) 10/17/2012    Caz Weaver P, PTA 12/10/2015, 12:10 PM  [facility] [address] [place], [place], [address] Phone: [phone]   Fax:  [phone]  Name: Jacob Benson MRN: [id number] Date of Birth: 1985-08-03

## 2015-12-12 ENCOUNTER — Ambulatory Visit: Payer: 59 | Admitting: Physical Therapy

## 2015-12-12 ENCOUNTER — Encounter: Payer: Self-pay | Admitting: Physical Therapy

## 2015-12-12 DIAGNOSIS — M25522 Pain in left elbow: Secondary | ICD-10-CM

## 2015-12-12 DIAGNOSIS — M25622 Stiffness of left elbow, not elsewhere classified: Secondary | ICD-10-CM

## 2015-12-12 DIAGNOSIS — S52022D Displaced fracture of olecranon process without intraarticular extension of left ulna, subsequent encounter for closed fracture with routine healing: Secondary | ICD-10-CM | POA: Diagnosis not present

## 2015-12-12 NOTE — Therapy (Signed)
[facility] [address] [place], [place], [address] Phone: [phone]   Fax:  [phone]  Physical Therapy Treatment  Patient Details  Name: Jacob Benson MRN: [id number] Date of Birth: [birth year] Referring Provider: Gershon Mussel, MD  Encounter Date: 12/12/2015      PT End of Session - 12/12/15 1149    Visit Number 16   Number of Visits 20   Date for PT Re-Evaluation 12/18/15   PT Start Time 1117   PT Stop Time 1145   PT Time Calculation (min) 28 min   Activity Tolerance Patient tolerated treatment well   Behavior During Therapy [facility] for tasks assessed/performed      Past Medical History:  Diagnosis Date  . Calcium kidney stones 2007  . Hyperlipidemia   . Hypertension   . Olecranon fracture    left    Past Surgical History:  Procedure Laterality Date  . FRACTURE SURGERY    . I&D EXTREMITY Left 09/13/2015   Procedure: IRRIGATION AND DEBRIDEMENT EXTREMITY;  Surgeon: Tarry Kos, MD;  [facility];  Service: Orthopedics;  Laterality: Left;  . ORIF ELBOW FRACTURE Left 10/30/2015   Procedure: OPEN REDUCTION INTERNAL FIXATION (ORIF) LEFT OLECRANON FRACTURE;  Surgeon: Tarry Kos, MD;  [facility];  Service: Orthopedics;  Laterality: Left;  . ORIF HUMERUS FRACTURE Left 09/13/2015   Procedure: OPEN REDUCTION INTERNAL FIXATION (ORIF) ELBOW/OLECRANON AND I AND D;  Surgeon: Tarry Kos, MD;  [facility];  Service: Orthopedics;  Laterality: Left;  . TONSILLECTOMY      There were no vitals filed for this visit.      Subjective Assessment - 12/12/15 1148    Subjective Reports that he goes to MD after PT appointment today. Has been picking up a gallon of milk to stretch into elbow extension but lets pain be a guide for any activity.   Pertinent History HTN   Patient Stated Goals get back motion and strength   Currently in Pain? No/denies            [facility] PT Assessment - 12/12/15 0001      Assessment   Medical  Diagnosis s/p ORIF L olecranon   Onset Date/Surgical Date 10/30/15   Next MD Visit 12/12/15     Precautions   Precaution Comments PROM, AAROM only, NWB and no strengthening   Required Braces or Orthoses Other Brace/Splint     Restrictions   Weight Bearing Restrictions Yes   LUE Weight Bearing Non weight bearing     ROM / Strength   AROM / PROM / Strength AROM     AROM   Overall AROM  Deficits   AROM Assessment Site Elbow   Right/Left Elbow Left   Left Elbow Flexion 115   Left Elbow Extension -30                     [facility] Adult PT Treatment/Exercise - 12/12/15 0001      Manual Therapy   Manual Therapy Passive ROM   Passive ROM PROM of L elbow in flex/ext to improve ROM                     PT Long Term Goals - 12/12/15 1151      PT LONG TERM GOAL #1   Title I with HEP   Time 4   Period Weeks   Status Achieved     PT LONG TERM GOAL #2  Title Improved L elbow flexion to 140 deg and extension to 155 deg or better to improve function.   Time 4   Period Weeks   Status Revised  AROM L elbow 30-115 deg 12/12/2015     PT LONG TERM GOAL #3   Title 5/5 L elbow strength to allow RTW   Time 4   Period Weeks   Status On-going               Plan - 12/12/15 1150    Clinical Impression Statement Patient tolerated today's treatment well and patient feels more comfortable following this surgery as he did with the first ORIF of L elbow. Patient diligent with stretching at home for L elbow ROM per patient report. No pain with PROM of L elbow per patient report. Goals remain on-going secondary to deficits with L elbow ROM and strength.   Rehab Potential Excellent   PT Frequency 3x / week   PT Duration 4 weeks   PT Treatment/Interventions ADLs/Self Care Home Management;Cryotherapy;Electrical Stimulation;Therapeutic exercise;Patient/family education;Manual techniques;Vasopneumatic Device;Passive range of motion;Neuromuscular re-education;Scar  mobilization   PT Next Visit Plan cont per MD orders; PROM, AAROM, No strengthening, No WB. Return to MD 12/12/15.   PT Home Exercise Plan scar massage, AROM wrist and hand.   Consulted and Agree with Plan of Care Patient      Patient will benefit from skilled therapeutic intervention in order to improve the following deficits and impairments:  Decreased range of motion, Pain, Increased edema, Decreased strength  Visit Diagnosis: Stiffness of left elbow, not elsewhere classified  Pain in left elbow     Problem List Patient Active Problem List   Diagnosis Date Noted  . S/P ORIF (open reduction internal fixation) fracture 09/13/2015  . HTN (hypertension) 10/17/2012    Florence Canner, PTA 12/12/15 12:06 PM  [facility] [address] [place], [place], [address] Phone: [phone]   Fax:  [phone]  Name: Jacob Benson MRN: [id number] Date of Birth: 18-May-1985

## 2015-12-16 ENCOUNTER — Ambulatory Visit: Payer: 59 | Attending: Orthopaedic Surgery | Admitting: Physical Therapy

## 2015-12-16 ENCOUNTER — Encounter: Payer: Self-pay | Admitting: Physical Therapy

## 2015-12-16 DIAGNOSIS — M25622 Stiffness of left elbow, not elsewhere classified: Secondary | ICD-10-CM | POA: Insufficient documentation

## 2015-12-16 DIAGNOSIS — M25522 Pain in left elbow: Secondary | ICD-10-CM | POA: Insufficient documentation

## 2015-12-16 NOTE — Therapy (Signed)
[facility] [address] [place], [place], [address] Phone: [phone]   Fax:  [phone]  Physical Therapy Treatment  Patient Details  Name: Jacob Benson MRN: [id number] Date of Birth: [birth year] Referring Provider: Gershon Mussel, MD  Encounter Date: 12/16/2015      PT End of Session - 12/16/15 1614    Visit Number 17   Number of Visits 34   Date for PT Re-Evaluation 01/29/16   PT Start Time 1612   PT Stop Time 1652   PT Time Calculation (min) 40 min   Activity Tolerance Patient tolerated treatment well   Behavior During Therapy [facility] for tasks assessed/performed      Past Medical History:  Diagnosis Date  . Calcium kidney stones 2007  . Hyperlipidemia   . Hypertension   . Olecranon fracture    left    Past Surgical History:  Procedure Laterality Date  . FRACTURE SURGERY    . I&D EXTREMITY Left 09/13/2015   Procedure: IRRIGATION AND DEBRIDEMENT EXTREMITY;  Surgeon: Tarry Kos, MD;  Location: [facility] OR;  Service: Orthopedics;  Laterality: Left;  . ORIF ELBOW FRACTURE Left 10/30/2015   Procedure: OPEN REDUCTION INTERNAL FIXATION (ORIF) LEFT OLECRANON FRACTURE;  Surgeon: Tarry Kos, MD;  Location: [facility] OR;  Service: Orthopedics;  Laterality: Left;  . ORIF HUMERUS FRACTURE Left 09/13/2015   Procedure: OPEN REDUCTION INTERNAL FIXATION (ORIF) ELBOW/OLECRANON AND I AND D;  Surgeon: Tarry Kos, MD;  Location: [facility] OR;  Service: Orthopedics;  Laterality: Left;  . TONSILLECTOMY      There were no vitals filed for this visit.      Subjective Assessment - 12/16/15 1612    Subjective Reports that MD cleared to RTW light duty, no more than 15# for work. Reports that MD cleared him for ROM, strengthening actvities. Has had a small toothache feel in L elbow today and yesterday but correlates that to RTW.   Pertinent History HTN   Patient Stated Goals get back motion and strength   Currently in Pain? Yes   Pain Score 2    Pain Location  Elbow   Pain Orientation Left   Pain Descriptors / Indicators Dull;Aching   Pain Type Surgical pain   Pain Onset 1 to 4 weeks ago            [facility] PT Assessment - 12/16/15 0001      Assessment   Medical Diagnosis s/p ORIF L olecranon   Onset Date/Surgical Date 10/30/15   Next MD Visit 01/23/2016                     [facility] Adult PT Treatment/Exercise - 12/16/15 0001      Exercises   Exercises Elbow;Wrist;Shoulder     Elbow Exercises   Elbow Flexion Strengthening;Left;Other reps (comment);Seated;Theraband  3x10 reps   Theraband Level (Elbow Flexion) Level 1 (Yellow)   Elbow Extension Strengthening;Left;Other reps (comment);Standing;Theraband  3x10 reps   Theraband Level (Elbow Extension) Level 1 (Yellow)   Forearm Supination Strengthening;Left;Other reps (comment);Seated;Theraband  3x10 reps   Theraband Level (Supination) Level 1 (Yellow)   Forearm Pronation Strengthening;Left;Other reps (comment);Seated;Theraband  3x10 reps   Theraband Level (Pronation) Level 1 (Yellow)   Wrist Flexion Strengthening;Left;Other reps (comment);Seated;Theraband  3x10 reps   Theraband Level (Wrist Flexion) Level 1 (Yellow)   Wrist Extension Strengthening;Left;Other reps (comment);Seated;Theraband  3x10 reps   Theraband Level (Wrist Extension) Level 1 (Yellow)   Other elbow exercises Pulley for L elbow extension  x5 min, UBE 120 RPM x5 min   Other elbow exercises Standing table walks for UE weightbearing green theraband x3 RT, Seated neutral forearm bicep curl x30 reps 3#, Seated pronated forearm bicep curl 3# x10 reps     Shoulder Exercises: Prone   Extension Strengthening;Left;Weights  3x10 reps   Extension Weight (lbs) 3     Shoulder Exercises: Standing   Flexion Strengthening;Left;Weights  3x10 reps   Shoulder Flexion Weight (lbs) 3     Shoulder Exercises: Stretch   Other Shoulder Stretches L Bicep stretch 10 reps x10 sec hold     Wrist Exercises   Wrist Radial  Deviation Strengthening;Left;Other reps (comment);Seated;Bar weights/barbell  3x10 reps   Bar Weights/Barbell (Radial Deviation) 3 lbs   Wrist Ulnar Deviation Strengthening;Left;Other reps (comment);Seated;Bar weights/barbell  3x10 rep   Bar Weights/Barbell (Ulnar Deviation) 3 lbs   Other wrist exercises Resisted L finger extension with red web x30 reps     Manual Therapy   Manual Therapy Passive ROM   Passive ROM PROM of L elbow into flex/ext, forearm into supination/pronation with gentle holds at end range                PT Education - 12/16/15 1622    Education provided Yes   Education Details HEP- L wrist, forearm, elbow strengthening with yellow theraband   Person(s) Educated Patient   Methods Explanation;Demonstration;Verbal cues;Handout   Comprehension Verbalized understanding;Returned demonstration;Verbal cues required             PT Long Term Goals - 12/12/15 1151      PT LONG TERM GOAL #1   Title I with HEP   Time 4   Period Weeks   Status Achieved     PT LONG TERM GOAL #2   Title Improved L elbow flexion to 140 deg and extension to 155 deg or better to improve function.   Time 4   Period Weeks   Status Revised  AROM L elbow 30-115 deg 12/12/2015     PT LONG TERM GOAL #3   Title 5/5 L elbow strength to allow RTW   Time 4   Period Weeks   Status On-going               Plan - 12/16/15 1712    Clinical Impression Statement Patient tolerated today's treatment of initiating L elbow ROM and strengthening exercises well. Patient required minimal to moderate multimodal cueing for proper exercise technique and any corrections required. Patient did not report any pain with any exercises except for discomfort with L pronated bicep curl with 3#. No complaints with weightbearing exercise initiated to promote stability of BUE for functional activities. Patient remains compliant with home stretching and icing and was encouraged to continue with regimen  following work activities. Patient accepted new HEP for L elbow, forearm, wrist strengthening with yellow theraband and verbalized understanding of parameters and technique.   Rehab Potential Excellent   PT Frequency 3x / week   PT Duration 4 weeks   PT Treatment/Interventions ADLs/Self Care Home Management;Cryotherapy;Electrical Stimulation;Therapeutic exercise;Patient/family education;Manual techniques;Vasopneumatic Device;Passive range of motion;Neuromuscular re-education;Scar mobilization   PT Next Visit Plan Continue with L elbow, wrist, forearm strengthening, ROM activities per MD note in media.   PT Home Exercise Plan scar massage, AROM wrist and hand; L elbow, wrist, forearm strengthening with yellow theraband   Consulted and Agree with Plan of Care Patient      Patient will benefit from skilled therapeutic intervention in order to improve  the following deficits and impairments:  Decreased range of motion, Pain, Increased edema, Decreased strength  Visit Diagnosis: Stiffness of left elbow, not elsewhere classified - Plan: PT plan of care cert/re-cert  Pain in left elbow - Plan: PT plan of care cert/re-cert     Problem List Patient Active Problem List   Diagnosis Date Noted  . S/P ORIF (open reduction internal fixation) fracture 09/13/2015  . HTN (hypertension) 10/17/2012    APPLEGATE, [place], PTA 12/16/2015, 6:52 PM [place] MPT [facility] [address] [place], [place], [address] Phone: [phone]   Fax:  [phone]  Name: Jacob Benson MRN: [id number] Date of Birth: [birth year]

## 2015-12-16 NOTE — Patient Instructions (Addendum)
Forearm Pronation: Resisted    With left palm up, stabilize forearm on thigh with other hand. Keep tubing to outside of hand and roll palm down as far as possible. Repeat _10___ times per set. Relax. Do _2-3___ sets per session. Do _2-3___ sessions per day.  Copyright  VHI. All rights reserved.  Forearm Supination: Resisted    With left palm down, stabilize forearm on thigh with other hand. Keep tubing to inside of hand and roll palm up as far as possible. Repeat _10___ times per set. Relax. Do __2-3__ sets per session. Do _2-3___ sessions per day.  Copyright  VHI. All rights reserved.  Elbow Flexion: Resisted    With tubing wrapped around left fist and other end secured under foot, curl arm up as far as possible. Repeat __10__ times per set. Do __2-3__ sets per session. Do _2-3___ sessions per day.  Copyright  VHI. All rights reserved.  Elbow Extension: Resisted    With tubing wrapped around left fist and other end anchored, straighten elbow. Repeat __10__ times per set. Do _2-3___ sets per session. Do _2-3___ sessions per day.  Copyright  VHI. All rights reserved.  Wrist Flexion: Resisted    With tubing wrapped around left fist and other end secured under foot, bend wrist up (palm up) as far as possible. Keep forearm on thigh. Repeat _10___ times per set. Do _2-3___ sets per session. Do _2-3___ sessions per day.  Copyright  VHI. All rights reserved.  Wrist Extension: Resisted    With tubing wrapped around left fist and other end secured under foot, bend wrist up (palm down) as far as possible. Keep forearm on thigh. Repeat _10___ times per set. Do _2-3___ sets per session. Do _2-3___ sessions per day.  Copyright  VHI. All rights reserved.

## 2015-12-17 ENCOUNTER — Other Ambulatory Visit: Payer: Self-pay

## 2015-12-17 MED ORDER — ESOMEPRAZOLE MAGNESIUM 40 MG PO CPDR: 40.0000 mg | DELAYED_RELEASE_CAPSULE | Freq: Every day | ORAL | 1 refills | Status: DC

## 2015-12-18 ENCOUNTER — Ambulatory Visit: Payer: 59 | Admitting: Physical Therapy

## 2015-12-18 ENCOUNTER — Encounter: Payer: Self-pay | Admitting: Physical Therapy

## 2015-12-18 DIAGNOSIS — M25622 Stiffness of left elbow, not elsewhere classified: Secondary | ICD-10-CM | POA: Diagnosis not present

## 2015-12-18 DIAGNOSIS — M25522 Pain in left elbow: Secondary | ICD-10-CM | POA: Diagnosis not present

## 2015-12-18 MED FILL — ESOMEPRAZOLE MAG DR 40 MG C: 40 | 90 days supply | Qty: 90 | Fill #0

## 2015-12-18 NOTE — Therapy (Signed)
[facility] [address] [place], [place], [address] Phone: [phone]   Fax:  [phone]  Physical Therapy Treatment  Patient Details  Name: Jacob Benson MRN: [id number] Date of Birth: 1986-03-07 Referring Provider: Gershon Mussel, MD  Encounter Date: 12/18/2015      PT End of Session - 12/18/15 1645    Visit Number 18   Number of Visits 34   Date for PT Re-Evaluation 01/29/16   PT Start Time 1644   PT Stop Time 1726   PT Time Calculation (min) 42 min   Activity Tolerance Patient tolerated treatment well   Behavior During Therapy [facility] for tasks assessed/performed      Past Medical History:  Diagnosis Date  . Calcium kidney stones 2007  . Hyperlipidemia   . Hypertension   . Olecranon fracture    left    Past Surgical History:  Procedure Laterality Date  . FRACTURE SURGERY    . I&D EXTREMITY Left 09/13/2015   Procedure: IRRIGATION AND DEBRIDEMENT EXTREMITY;  Surgeon: Tarry Kos, MD;  [facility]: [facility] OR;  Service: Orthopedics;  Laterality: Left;  . ORIF ELBOW FRACTURE Left 10/30/2015   Procedure: OPEN REDUCTION INTERNAL FIXATION (ORIF) LEFT OLECRANON FRACTURE;  Surgeon: Tarry Kos, MD;  [facility]: [facility] OR;  Service: Orthopedics;  Laterality: Left;  . ORIF HUMERUS FRACTURE Left 09/13/2015   Procedure: OPEN REDUCTION INTERNAL FIXATION (ORIF) ELBOW/OLECRANON AND I AND D;  Surgeon: Tarry Kos, MD;  [facility]: [facility] OR;  Service: Orthopedics;  Laterality: Left;  . TONSILLECTOMY      There were no vitals filed for this visit.      Subjective Assessment - 12/18/15 1645    Subjective Reports that his elbow is sore today and said he overstretched his elbow last night. Reports that wife told him to stop but he did not listen. Patient reports difficulty with trying to wash his face due to lack of full elbow flexion.   Pertinent History HTN   Patient Stated Goals get back motion and strength   Currently in Pain? Yes   Pain Score 2    Pain Location Elbow   Pain Orientation Left   Pain Descriptors / Indicators Dull;Sore   Pain Type Surgical pain   Pain Onset 1 to 4 weeks ago            [facility] PT Assessment - 12/18/15 0001      Assessment   Medical Diagnosis s/p ORIF L olecranon   Onset Date/Surgical Date 10/30/15   Next MD Visit 01/23/2016     ROM / Strength   AROM / PROM / Strength AROM     AROM   Overall AROM  Deficits   AROM Assessment Site Elbow   Right/Left Elbow Left   Left Elbow Extension -25                     [facility] Adult PT Treatment/Exercise - 12/18/15 0001      Elbow Exercises   Elbow Flexion Strengthening;Left;Other reps (comment);Seated;Bar weights/barbell  3x10 reps; neutral forearm 4# curl x30 reps   Bar Weights/Barbell (Elbow Flexion) 4 lbs   Elbow Extension Strengthening;Left;Other reps (comment);Standing;Bar weights/barbell  3x10 reps   Bar Weights/Barbell (Elbow Extension) 4 lbs   Forearm Supination Strengthening;Left;Other reps (comment);Seated;Bar weights/barbell  4# 3x10 reps   Forearm Pronation Strengthening;Left;Other reps (comment);Seated;Bar weights/barbell  4# 3x10 reps   Wrist Flexion Strengthening;Left;Other reps (comment);Seated;Bar weights/barbell  3x10 reps   Bar Weights/Barbell (Wrist Flexion) 4  lbs   Wrist Extension Strengthening;Left;Other reps (comment);Seated;Bar weights/barbell  3x10 reps   Bar Weights/Barbell (Wrist Extension) 4 lbs   Other elbow exercises Pulley L elbow extension x5 min   Other elbow exercises L hand grip with red web x30 reps     Shoulder Exercises: Prone   Extension Strengthening;Left;Weights;Other (comment)  3x10 reps   Extension Weight (lbs) 4     Shoulder Exercises: Standing   Flexion Strengthening;Left;Weights;Other (comment)  3x10 reps   Shoulder Flexion Weight (lbs) 4     Shoulder Exercises: ROM/Strengthening   UBE (Upper Arm Bike) 120 RPM x6 min     Wrist Exercises   Wrist Radial Deviation  Strengthening;Left;Other reps (comment);Seated;Bar weights/barbell  3x10 reps   Bar Weights/Barbell (Radial Deviation) 4 lbs   Wrist Ulnar Deviation Strengthening;Left;Other reps (comment);Seated;Bar weights/barbell  3x10 reps   Bar Weights/Barbell (Ulnar Deviation) 4 lbs     Manual Therapy   Manual Therapy Passive ROM   Passive ROM PROM of L elbow into flex/ext, forearm into supination/pronation with gentle holds at end range                     PT Long Term Goals - 12/12/15 1151      PT LONG TERM GOAL #1   Title I with HEP   Time 4   Period Weeks   Status Achieved     PT LONG TERM GOAL #2   Title Improved L elbow flexion to 140 deg and extension to 155 deg or better to improve function.   Time 4   Period Weeks   Status Revised  AROM L elbow 30-115 deg 12/12/2015     PT LONG TERM GOAL #3   Title 5/5 L elbow strength to allow RTW   Time 4   Period Weeks   Status On-going               Plan - 12/18/15 1741    Clinical Impression Statement Patient tolerated today's treatment well and was able to progress in regards to handweights today. Patient was progressed to 4# handweights as he reported 3# was easy during previous treatment. Patient had no reports of discomfort during any of the exercises today. AROM L elbow extension improved to 25 deg from neutral today. Patient was given green theraband for HEP as yellow was reported as easy with caution regarding possible soreness.   Rehab Potential Excellent   PT Frequency 3x / week   PT Duration 4 weeks   PT Treatment/Interventions ADLs/Self Care Home Management;Cryotherapy;Electrical Stimulation;Therapeutic exercise;Patient/family education;Manual techniques;Vasopneumatic Device;Passive range of motion;Neuromuscular re-education;Scar mobilization   PT Next Visit Plan Continue with L elbow, wrist, forearm strengthening, ROM activities per MD note in media.   PT Home Exercise Plan scar massage, AROM wrist and  hand; L elbow, wrist, forearm strengthening with yellow theraband/ green    Consulted and Agree with Plan of Care Patient      Patient will benefit from skilled therapeutic intervention in order to improve the following deficits and impairments:  Decreased range of motion, Pain, Increased edema, Decreased strength  Visit Diagnosis: Stiffness of left elbow, not elsewhere classified  Pain in left elbow     Problem List Patient Active Problem List   Diagnosis Date Noted  . S/P ORIF (open reduction internal fixation) fracture 09/13/2015  . HTN (hypertension) 10/17/2012    Evelene Croon, PTA 12/18/2015, 5:53 PM  [facility] [address] [place], [place], [address] Phone:  [phone]   Fax:  [phone]  Name: Jacob Benson MRN: [id number] Date of Birth: Sep 30, 1985

## 2015-12-22 ENCOUNTER — Ambulatory Visit: Payer: 59 | Admitting: Physical Therapy

## 2015-12-23 ENCOUNTER — Ambulatory Visit: Payer: 59 | Admitting: *Deleted

## 2015-12-23 DIAGNOSIS — M25622 Stiffness of left elbow, not elsewhere classified: Secondary | ICD-10-CM

## 2015-12-23 DIAGNOSIS — M25522 Pain in left elbow: Secondary | ICD-10-CM | POA: Diagnosis not present

## 2015-12-23 NOTE — Therapy (Signed)
[facility] [address] [place], [place], [address] Phone: [phone]   Fax:  [phone]  Physical Therapy Treatment  Patient Details  Name: Jacob Benson MRN: [id number] Date of Birth: Aug 05, 1985 Referring Provider: Gershon Mussel, MD  Encounter Date: 12/23/2015      PT End of Session - 12/23/15 1748    Visit Number 19   Number of Visits 34   Date for PT Re-Evaluation 01/29/16   PT Start Time 1645   PT Stop Time 1735   PT Time Calculation (min) 50 min      Past Medical History:  Diagnosis Date  . Calcium kidney stones 2007  . Hyperlipidemia   . Hypertension   . Olecranon fracture    left    Past Surgical History:  Procedure Laterality Date  . FRACTURE SURGERY    . I&D EXTREMITY Left 09/13/2015   Procedure: IRRIGATION AND DEBRIDEMENT EXTREMITY;  Surgeon: Tarry Kos, MD;  [facility];  Service: Orthopedics;  Laterality: Left;  . ORIF ELBOW FRACTURE Left 10/30/2015   Procedure: OPEN REDUCTION INTERNAL FIXATION (ORIF) LEFT OLECRANON FRACTURE;  Surgeon: Tarry Kos, MD;  [facility];  Service: Orthopedics;  Laterality: Left;  . ORIF HUMERUS FRACTURE Left 09/13/2015   Procedure: OPEN REDUCTION INTERNAL FIXATION (ORIF) ELBOW/OLECRANON AND I AND D;  Surgeon: Tarry Kos, MD;  [facility]: [facility] OR;  Service: Orthopedics;  Laterality: Left;  . TONSILLECTOMY      There were no vitals filed for this visit.      Subjective Assessment - 12/23/15 1657    Subjective Reports that his elbow is sore today and said he overstretched his elbow last night. Reports that wife told him to stop but he did not listen. Patient reports difficulty with trying to wash his face due to lack of full elbow flexion.   Pertinent History HTN   Patient Stated Goals get back motion and strength   Currently in Pain? Yes   Pain Score 3    Pain Location Elbow   Pain Orientation Left   Pain Descriptors / Indicators Aching   Pain Type Surgical pain   Pain Onset 1 to 4 weeks ago   Pain Frequency Intermittent                         [facility] Adult PT Treatment/Exercise - 12/23/15 0001      Exercises   Exercises Elbow;Wrist;Shoulder     Elbow Exercises   Elbow Flexion Strengthening;Left;Other reps (comment);Seated;Bar weights/barbell   Bar Weights/Barbell (Elbow Flexion) 4 lbs   Elbow Extension Strengthening;Left;Other reps (comment);Standing;Bar weights/barbell  Leaning on [employer] Weights/Barbell (Elbow Extension) 5 lbs   Forearm Supination Strengthening;Left;Other reps (comment);Seated;Bar weights/barbell  5# 3x10 reps   Forearm Pronation Strengthening;Left;Other reps (comment);Seated;Bar weights/barbell  5# 3x10 reps   Wrist Flexion Strengthening;Left;Other reps (comment);Seated;Bar weights/barbell  3x10 reps   Bar Weights/Barbell (Wrist Flexion) 5 lbs   Wrist Extension Strengthening;Left;Other reps (comment);Seated;Bar weights/barbell  3x10 reps   Bar Weights/Barbell (Wrist Extension) 5 lbs   Other elbow exercises Pulley L elbow extension x5 min   Other elbow exercises L hand grip with red web x30 reps     Shoulder Exercises: Standing   Flexion Strengthening;Left;Weights;Other (comment)  3x10 reps   Shoulder Flexion Weight (lbs) 5   Other Standing Exercises D1/D2 3x10     Shoulder Exercises: ROM/Strengthening   UBE (Upper Arm Bike) 120 RPM x6 min  Manual Therapy   Manual Therapy Passive ROM   Passive ROM PROM of L elbow into flex/ext, forearm into supination/pronation with gentle holds at end range                     PT Long Term Goals - 12/12/15 1151      PT LONG TERM GOAL #1   Title I with HEP   Time 4   Period Weeks   Status Achieved     PT LONG TERM GOAL #2   Title Improved L elbow flexion to 140 deg and extension to 155 deg or better to improve function.   Time 4   Period Weeks   Status Revised  AROM L elbow 30-115 deg 12/12/2015     PT LONG TERM GOAL #3   Title 5/5  L elbow strength to allow RTW   Time 4   Period Weeks   Status On-going               Plan - 12/23/15 1752    Clinical Impression Statement Pt did fairly well today with Therex for LT elbow and was able to progress some  PREs to 5#s. We also added in more  exs to incorporate the whole UE. Pt noticed yesterday that his elbow is starting to have some swelling  since starting back to work and some mild pitting edema was noticed today around his elbow and is warm to the touch. Pt was going to start back on anti-inflammatory meds and  ice more at home.   Rehab Potential Excellent   PT Frequency 3x / week   PT Duration 4 weeks   PT Treatment/Interventions ADLs/Self Care Home Management;Cryotherapy;Electrical Stimulation;Therapeutic exercise;Patient/family education;Manual techniques;Vasopneumatic Device;Passive range of motion;Neuromuscular re-education;Scar mobilization   PT Next Visit Plan Continue with L elbow, wrist, forearm strengthening, ROM activities per MD note in media.   PT Home Exercise Plan scar massage, AROM wrist and hand; L elbow, wrist, forearm strengthening with yellow theraband/ green    Consulted and Agree with Plan of Care Patient      Patient will benefit from skilled therapeutic intervention in order to improve the following deficits and impairments:  Decreased range of motion, Pain, Increased edema, Decreased strength  Visit Diagnosis: Stiffness of left elbow, not elsewhere classified  Pain in left elbow     Problem List Patient Active Problem List   Diagnosis Date Noted  . S/P ORIF (open reduction internal fixation) fracture 09/13/2015  . HTN (hypertension) 10/17/2012    RAMSEUR,CHRIS,PTA 12/23/2015, 6:01 PM  [facility] [address] [place], [place], [address] Phone: [phone]   Fax:  [phone]  Name: Jacob Benson MRN: [id number] Date of Birth: 1986/01/06

## 2015-12-24 ENCOUNTER — Ambulatory Visit: Payer: 59 | Admitting: Physical Therapy

## 2015-12-24 DIAGNOSIS — M25522 Pain in left elbow: Secondary | ICD-10-CM

## 2015-12-24 DIAGNOSIS — M25622 Stiffness of left elbow, not elsewhere classified: Secondary | ICD-10-CM | POA: Diagnosis not present

## 2015-12-24 NOTE — Therapy (Signed)
[facility] [address] [place], [place], [address] Phone: [phone]   Fax:  [phone]  Physical Therapy Treatment  Patient Details  Name: Jacob Benson MRN: [id number] Date of Birth: March 20, 1986 Referring Provider: Gershon Mussel, MD  Encounter Date: 12/24/2015      PT End of Session - 12/24/15 1531    Visit Number 20   Number of Visits 34   Date for PT Re-Evaluation 01/29/16   PT Start Time 1457   PT Stop Time 1545   PT Time Calculation (min) 48 min   Activity Tolerance Patient tolerated treatment well   Behavior During Therapy [facility] for tasks assessed/performed      Past Medical History:  Diagnosis Date  . Calcium kidney stones 2007  . Hyperlipidemia   . Hypertension   . Olecranon fracture    left    Past Surgical History:  Procedure Laterality Date  . FRACTURE SURGERY    . I&D EXTREMITY Left 09/13/2015   Procedure: IRRIGATION AND DEBRIDEMENT EXTREMITY;  Surgeon: Tarry Kos, MD;  Location: [facility] OR;  Service: Orthopedics;  Laterality: Left;  . ORIF ELBOW FRACTURE Left 10/30/2015   Procedure: OPEN REDUCTION INTERNAL FIXATION (ORIF) LEFT OLECRANON FRACTURE;  Surgeon: Tarry Kos, MD;  Location: [facility] OR;  Service: Orthopedics;  Laterality: Left;  . ORIF HUMERUS FRACTURE Left 09/13/2015   Procedure: OPEN REDUCTION INTERNAL FIXATION (ORIF) ELBOW/OLECRANON AND I AND D;  Surgeon: Tarry Kos, MD;  Location: [facility] OR;  Service: Orthopedics;  Laterality: Left;  . TONSILLECTOMY      There were no vitals filed for this visit.      Subjective Assessment - 12/24/15 1501    Subjective elbow is a little swollen; just a dull pain.   Pertinent History HTN   Patient Stated Goals get back motion and strength   Currently in Pain? Yes   Pain Score 2    Pain Location Elbow   Pain Orientation Left   Pain Descriptors / Indicators Aching;Sore   Pain Type Surgical pain   Pain Onset 1 to 4 weeks ago   Pain Frequency Intermittent    Aggravating Factors  nothing   Pain Relieving Factors rest                         [facility] Adult PT Treatment/Exercise - 12/24/15 1502      Elbow Exercises   Elbow Flexion Strengthening;Left;Other reps (comment);Seated;Bar weights/barbell  3x10   Bar Weights/Barbell (Elbow Flexion) 5 lbs   Forearm Supination Strengthening;Left;Other reps (comment);Seated;Bar weights/barbell   Forearm Supination Limitations 5# 3x10   Forearm Pronation Strengthening;Left;Other reps (comment);Seated;Bar weights/barbell   Forearm Pronation Limitations 5# 3x10   Wrist Flexion Strengthening;Left;Other reps (comment);Seated;Bar weights/barbell   Bar Weights/Barbell (Wrist Flexion) 5 lbs   Wrist Flexion Limitations 3x10   Wrist Extension Strengthening;Left;Other reps (comment);Seated;Bar weights/barbell   Bar Weights/Barbell (Wrist Extension) 5 lbs   Wrist Extension Limitations 3x10   Other elbow exercises Pulley L elbow extension x5 min   Other elbow exercises L hand grip with red web x30 reps     Shoulder Exercises: Seated   Other Seated Exercises overhead shoulder press 5% 3x10     Modalities   Modalities Vasopneumatic     Vasopneumatic   Number Minutes Vasopneumatic  15 minutes   Vasopnuematic Location  Other (comment)  elbow - used ankle sleeve   Vasopneumatic Pressure Medium   Vasopneumatic Temperature  max cold  Manual Therapy   Passive ROM PROM of L elbow into flex/ext, forearm into supination/pronation with gentle holds at end range                     PT Long Term Goals - 12/12/15 1151      PT LONG TERM GOAL #1   Title I with HEP   Time 4   Period Weeks   Status Achieved     PT LONG TERM GOAL #2   Title Improved L elbow flexion to 140 deg and extension to 155 deg or better to improve function.   Time 4   Period Weeks   Status Revised  AROM L elbow 30-115 deg 12/12/2015     PT LONG TERM GOAL #3   Title 5/5 L elbow strength to allow RTW   Time 4    Period Weeks   Status On-going               Plan - 12/24/15 1531    Clinical Impression Statement ROM continues to be limited mostly due to swelling in elbow so used vaso today to help with swelling.  Will conitnue to benefit from PT to maximize function.  Strength exercises progressing well within available range.   PT Treatment/Interventions ADLs/Self Care Home Management;Cryotherapy;Electrical Stimulation;Therapeutic exercise;Patient/family education;Manual techniques;Vasopneumatic Device;Passive range of motion;Neuromuscular re-education;Scar mobilization   PT Next Visit Plan Continue with L elbow, wrist, forearm strengthening, ROM activities per MD note in media.   PT Home Exercise Plan scar massage, AROM wrist and hand; L elbow, wrist, forearm strengthening with yellow theraband/ green    Consulted and Agree with Plan of Care Patient      Patient will benefit from skilled therapeutic intervention in order to improve the following deficits and impairments:  Decreased range of motion, Pain, Increased edema, Decreased strength  Visit Diagnosis: Stiffness of left elbow, not elsewhere classified  Pain in left elbow     Problem List Patient Active Problem List   Diagnosis Date Noted  . S/P ORIF (open reduction internal fixation) fracture 09/13/2015  . HTN (hypertension) 10/17/2012       Clarita CraneStephanie F Ernesteen Mihalic, PT, DPT 12/24/15 3:34 PM    [facility] [address] [place], [place], [address] Phone: [phone]   Fax:  [phone]  Name: Jacob Benson MRN: [id number] Date of Birth: 1986-02-04

## 2015-12-30 ENCOUNTER — Ambulatory Visit: Payer: 59 | Admitting: Physical Therapy

## 2015-12-30 ENCOUNTER — Encounter: Payer: Self-pay | Admitting: Physical Therapy

## 2015-12-30 DIAGNOSIS — M25622 Stiffness of left elbow, not elsewhere classified: Secondary | ICD-10-CM

## 2015-12-30 DIAGNOSIS — M25522 Pain in left elbow: Secondary | ICD-10-CM | POA: Diagnosis not present

## 2015-12-30 NOTE — Therapy (Signed)
[facility] [address] [place], [place], [address] Phone: [phone]   Fax:  [phone]  Physical Therapy Treatment  Patient Details  Name: Jacob Benson MRN: [id number] Date of Birth: [birth year] Referring Provider: Gershon MusselNaiping Xu, MD  Encounter Date: 12/30/2015      PT End of Session - 12/30/15 1554    Visit Number 21   Number of Visits 34   Date for PT Re-Evaluation 01/29/16   PT Start Time 1557   PT Stop Time 1644   PT Time Calculation (min) 47 min   Activity Tolerance Patient tolerated treatment well   Behavior [facility] Therapy [facility] for tasks assessed/performed      Past Medical History:  Diagnosis Date  . Calcium kidney stones 2007  . Hyperlipidemia   . Hypertension   . Olecranon fracture    left    Past Surgical History:  Procedure Laterality Date  . FRACTURE SURGERY    . I&D EXTREMITY Left 09/13/2015   Procedure: IRRIGATION AND DEBRIDEMENT EXTREMITY;  Surgeon: Tarry KosNaiping M Xu, MD;  [facility];  Service: Orthopedics;  Laterality: Left;  . ORIF ELBOW FRACTURE Left 10/30/2015   Procedure: OPEN REDUCTION INTERNAL FIXATION (ORIF) LEFT OLECRANON FRACTURE;  Surgeon: Tarry KosNaiping M Xu, MD;  [facility];  Service: Orthopedics;  Laterality: Left;  . ORIF HUMERUS FRACTURE Left 09/13/2015   Procedure: OPEN REDUCTION INTERNAL FIXATION (ORIF) ELBOW/OLECRANON AND I AND D;  Surgeon: Tarry KosNaiping M Xu, MD;  [facility];  Service: Orthopedics;  Laterality: Left;  . TONSILLECTOMY      There were no vitals filed for this visit.      Subjective Assessment - 12/30/15 1554    Subjective Patient reports that the swelling has gone down around the elbow. Reports that he has some pressure with picking things up or sliding on tool belt in mid L forearm. Reports that he has bought weights and has been doing exercises with them 3-4 times a day at home.   Pertinent History HTN   Patient Stated Goals get back motion and strength   Currently in  Pain? Yes   Pain Score 3    Pain Location Elbow   Pain Orientation Left   Pain Descriptors / Indicators Dull;Aching;Pressure   Pain Type Surgical pain   Pain Onset 1 to 4 weeks ago            [facility] 12/30/15 0001      Assessment   Medical Diagnosis s/p ORIF L olecranon   Onset Date/Surgical Date 10/30/15   Next MD Visit 01/23/2016                     [facility] - 12/30/15 0001      Elbow Exercises   Elbow Flexion Strengthening;Left;Other reps (comment);Seated;Bar weights/barbell   Bar Weights/Barbell (Elbow Flexion) Other (comment)  6#   Elbow Flexion Limitations 4x10 reps   Elbow Extension Strengthening;Left;Other reps (comment);Standing;Bar weights/barbell   Bar Weights/Barbell (Elbow Extension) Other (comment)  6#   Elbow Extension Limitations 4 x10 reps   Forearm Supination Strengthening;Left;Other reps (comment);Seated;Bar weights/barbell   Forearm Supination Limitations 6# 4x10 reps   Forearm Pronation Strengthening;Left;Other reps (comment);Seated;Bar weights/barbell   Forearm Pronation Limitations 6# 4x10 reps   Wrist Flexion Strengthening;Left;Other reps (comment);Seated;Bar weights/barbell   Bar Weights/Barbell (Wrist Flexion) Other (comment)  6#   Wrist Flexion Limitations 4x10 reps   Wrist Extension Strengthening;Left;Other reps (comment);Seated;Bar weights/barbell   Bar Weights/Barbell (Wrist Extension)  Other (comment)  6#   Wrist Extension Limitations 4x10 reps   Other elbow exercises L wrist ulnar deviation 6# 4x10 reps   Other elbow exercises L hand grip with red web x40 reps     Shoulder Exercises: Seated   Other Seated Exercises overhead shoulder press 6# 3x10     Shoulder Exercises: Prone   Extension Strengthening;Left;Weights;Other (comment)   Extension Weight (lbs) 6     Shoulder Exercises: Standing   Flexion Strengthening;Left;Weights;Other (comment)   Shoulder Flexion Weight (lbs) 6    Flexion Limitations 4x10 reps     Shoulder Exercises: Pulleys   Flexion Other (comment)  x5 min     Shoulder Exercises: ROM/Strengthening   UBE (Upper Arm Bike) 90 RPM x8 min     Modalities   Modalities Vasopneumatic     Vasopneumatic   Number Minutes Vasopneumatic  15 minutes   Vasopnuematic Location  Other (comment)  L elbow   Vasopneumatic Pressure Low   Vasopneumatic Temperature  34                     PT Long Term Goals - 12/12/15 1151      PT LONG TERM GOAL #1   Title I with HEP   Time 4   Period Weeks   Status Achieved     PT LONG TERM GOAL #2   Title Improved L elbow flexion to 140 deg and extension to 155 deg or better to improve function.   Time 4   Period Weeks   Status Revised  AROM L elbow 30-115 deg 12/12/2015     PT LONG TERM GOAL #3   Title 5/5 L elbow strength to allow RTW   Time 4   Period Weeks   Status On-going               Plan - 12/30/15 1801    Clinical Impression Statement Patient continues to progress with LUE strengthening and was able to progress to 6# handweight today with all strengthening. Patient had no complaint of pain or discomfort with any of the exercises today. Vasopneumatic modality utilized to L elbow to decrease L elbow edema present. Patient did not report any pain or discomfort following today's treatment.   Rehab Potential Excellent   PT Frequency 3x / week   PT Duration 4 weeks   PT Treatment/Interventions ADLs/Self Care Home Management;Cryotherapy;Electrical Stimulation;Therapeutic exercise;Patient/family education;Manual techniques;Vasopneumatic Device;Passive range of motion;Neuromuscular re-education;Scar mobilization   PT Next Visit Plan Continue with L elbow, wrist, forearm strengthening, ROM activities per MD note in media.   PT Home Exercise Plan scar massage, AROM wrist and hand; L elbow, wrist, forearm strengthening with yellow theraband/ green    Consulted and Agree with Plan of Care Patient       Patient will benefit from skilled therapeutic intervention in order to improve the following deficits and impairments:  Decreased range of motion, Pain, Increased edema, Decreased strength  Visit Diagnosis: Stiffness of left elbow, not elsewhere classified  Pain in left elbow     Problem List Patient Active Problem List   Diagnosis Date Noted  . S/P ORIF (open reduction internal fixation) fracture 09/13/2015  . HTN (hypertension) 10/17/2012    Evelene CroonKelsey M Parsons, PTA 12/30/2015, 6:22 PM  [facility] [address] [place], [place], [address] Phone: [phone]   Fax:  [phone]  Name: Jacob Benson MRN: [id number] Date of Birth: 07-May-1986

## 2016-01-01 ENCOUNTER — Ambulatory Visit: Payer: 59 | Admitting: Physical Therapy

## 2016-01-01 ENCOUNTER — Encounter: Payer: Self-pay | Admitting: Physical Therapy

## 2016-01-01 DIAGNOSIS — M25522 Pain in left elbow: Secondary | ICD-10-CM | POA: Diagnosis not present

## 2016-01-01 DIAGNOSIS — M25622 Stiffness of left elbow, not elsewhere classified: Secondary | ICD-10-CM | POA: Diagnosis not present

## 2016-01-01 NOTE — Therapy (Signed)
[facility] [address] [place], [place], [address] Phone: [phone]   Fax:  [phone]  Physical Therapy Treatment  Patient Details  Name: Jacob Benson MRN: [id number] Date of Birth: 01-06-1986 Referring Provider: Gershon MusselNaiping Xu, MD  Encounter Date: 01/01/2016      PT End of Session - 01/01/16 1607    Visit Number 22   Number of Visits 34   Date for PT Re-Evaluation 01/29/16   PT Start Time 1557   PT Stop Time 1638   PT Time Calculation (min) 41 min   Activity Tolerance Patient tolerated treatment well   Behavior During Therapy [facility] for tasks assessed/performed      Past Medical History:  Diagnosis Date  . Calcium kidney stones 2007  . Hyperlipidemia   . Hypertension   . Olecranon fracture    left    Past Surgical History:  Procedure Laterality Date  . FRACTURE SURGERY    . I&D EXTREMITY Left 09/13/2015   Procedure: IRRIGATION AND DEBRIDEMENT EXTREMITY;  Surgeon: Tarry KosNaiping M Xu, MD;  [facility];  Service: Orthopedics;  Laterality: Left;  . ORIF ELBOW FRACTURE Left 10/30/2015   Procedure: OPEN REDUCTION INTERNAL FIXATION (ORIF) LEFT OLECRANON FRACTURE;  Surgeon: Tarry KosNaiping M Xu, MD;  [facility];  Service: Orthopedics;  Laterality: Left;  . ORIF HUMERUS FRACTURE Left 09/13/2015   Procedure: OPEN REDUCTION INTERNAL FIXATION (ORIF) ELBOW/OLECRANON AND I AND D;  Surgeon: Tarry KosNaiping M Xu, MD;  [facility];  Service: Orthopedics;  Laterality: Left;  . TONSILLECTOMY      There were no vitals filed for this visit.      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    Pertinent History HTN   Patient Stated Goals get back motion and strength   Currently in Pain? Yes   Pain Score 2    Pain Location Elbow   Pain Orientation Left   Pain Descriptors / Indicators Sore   Pain Type Surgical pain   Pain Onset 1 to 4 weeks ago             [facility] - 01/01/16 0001      Assessment   Medical Diagnosis s/p ORIF L olecranon   Onset Date/Surgical Date 10/30/15   Next MD Visit 01/23/2016     ROM / Strength   AROM / PROM / Strength AROM     AROM   Overall AROM  Within functional limits for tasks performed;Deficits   AROM Assessment Site Elbow   Right/Left Elbow Left   Left Elbow Flexion 130   Left Elbow Extension -15                     [facility] Adult PT Treatment/Exercise - 01/01/16 0001      Elbow Exercises   Elbow Flexion Strengthening;Left;Other reps (comment);Seated;Bar weights/barbell   Bar Weights/Barbell (Elbow Flexion) Other (comment)  7#   Elbow Flexion Limitations 4x10 reps   Elbow Extension Strengthening;Left;Other reps (comment);Standing;Bar weights/barbell   Bar Weights/Barbell (Elbow Extension) Other (comment)  7#   Elbow Extension Limitations 4x10 reps   Forearm Supination Strengthening;Left;Other reps (comment);Seated;Bar weights/barbell   Forearm Supination Limitations 7# 4x10 reps   Forearm Pronation Strengthening;Left;Other reps (comment);Seated;Bar weights/barbell   Forearm Pronation Limitations 7# 4x10 reps   Wrist Flexion Strengthening;Left;Other reps (comment);Seated;Bar weights/barbell   Bar Weights/Barbell (Wrist Flexion) Other (comment)  7#  Wrist Flexion Limitations 4x10 reps   Wrist Extension Strengthening;Left;Other reps (comment);Seated;Bar weights/barbell   Bar Weights/Barbell (Wrist Extension) Other (comment)  7#   Wrist Extension Limitations 4x10 reps   Other elbow exercises L wrist radial deviation 6# 4x10 reps   Other elbow exercises L hand grip with red web x3 min     Shoulder Exercises: Seated   Flexion Strengthening;Left;Weights  4x10 reps   Flexion Weight (lbs) 7   Other Seated Exercises overhead shoulder press 7# 4x10     Shoulder Exercises: Prone   Extension Strengthening;Left;Weights;Other (comment)  4x10 reps   Extension Weight  (lbs) 7     Shoulder Exercises: Pulleys   Flexion Other (comment)  x5 min     Shoulder Exercises: ROM/Strengthening   UBE (Upper Arm Bike) 60 RPM x8 min     Manual Therapy   Manual Therapy Passive ROM   Passive ROM PROM of L elbow into flex/ext, forearm into supination/pronation with gentle holds at end range                     PT Long Term Goals - 01/01/16 1645      PT LONG TERM GOAL #1   Title I with HEP   Time 4   Period Weeks   Status Achieved     PT LONG TERM GOAL #2   Title Improved L elbow flexion to 140 deg and extension to 155 deg or better to improve function.   Time 4   Period Weeks   Status Revised  AROM L elbow 15-130 deg 01/01/2016     PT LONG TERM GOAL #3   Title 5/5 L elbow strength to allow RTW   Time 4   Period Weeks   Status On-going               Plan - 01/01/16 1607    Clinical Impression Statement Patient continues to progress with LUE strengthening and ROM. Patient able to progress to increased resistance with UBE and handweights with exercises. Patient had no complaint during today's treatment. Patient presented in clinic with compressive sleeve over L elbow to which he stated he was using to reduce swelling with work activities. AROM of L elbow improved to 15-130 deg in supine today.    Rehab Potential Excellent   PT Frequency 3x / week   PT Duration 4 weeks   PT Treatment/Interventions ADLs/Self Care Home Management;Cryotherapy;Electrical Stimulation;Therapeutic exercise;Patient/family education;Manual techniques;Vasopneumatic Device;Passive range of motion;Neuromuscular re-education;Scar mobilization   PT Next Visit Plan Continue with L elbow, wrist, forearm strengthening, ROM activities per MD note in media.   PT Home Exercise Plan scar massage, AROM wrist and hand; L elbow, wrist, forearm strengthening with yellow theraband/ green    Consulted and Agree with Plan of Care Patient      Patient will benefit from skilled  therapeutic intervention in order to improve the following deficits and impairments:  Decreased range of motion, Pain, Increased edema, Decreased strength  Visit Diagnosis: Stiffness of left elbow, not elsewhere classified  Pain in left elbow     Problem List Patient Active Problem List   Diagnosis Date Noted  . S/P ORIF (open reduction internal fixation) fracture 09/13/2015  . HTN (hypertension) 10/17/2012    Evelene CroonKelsey M Parsons, PTA 01/01/2016, 4:48 PM  [facility] [address] [place], [place], [address] Phone: [phone]   Fax:  [phone]  Name: Jacob Benson MRN: [id number] Date of Birth: April 21, 1986

## 2016-01-13 ENCOUNTER — Ambulatory Visit: Payer: 59 | Admitting: Physical Therapy

## 2016-01-13 DIAGNOSIS — M25622 Stiffness of left elbow, not elsewhere classified: Secondary | ICD-10-CM

## 2016-01-13 DIAGNOSIS — M25522 Pain in left elbow: Secondary | ICD-10-CM

## 2016-01-13 NOTE — Therapy (Signed)
[facility] [address] [place], [place], [address] Phone: [phone]   Fax:  [phone]  Physical Therapy Treatment  Patient Details  Name: Jacob Benson MRN: [id number] Date of Birth: 06/20/1985 Referring Provider: Gershon MusselNaiping Xu, MD  Encounter Date: 01/13/2016      PT End of Session - 01/13/16 1638    Visit Number 23   Number of Visits 34   Date for PT Re-Evaluation 01/29/16   PT Start Time 0358   PT Stop Time 0444   PT Time Calculation (min) 46 min   Activity Tolerance Patient tolerated treatment well   Behavior During Therapy [facility] for tasks assessed/performed      Past Medical History:  Diagnosis Date  . Calcium kidney stones 2007  . Hyperlipidemia   . Hypertension   . Olecranon fracture    left    Past Surgical History:  Procedure Laterality Date  . FRACTURE SURGERY    . I&D EXTREMITY Left 09/13/2015   Procedure: IRRIGATION AND DEBRIDEMENT EXTREMITY;  Surgeon: Tarry KosNaiping M Xu, MD;  [facility];  Service: Orthopedics;  Laterality: Left;  . ORIF ELBOW FRACTURE Left 10/30/2015   Procedure: OPEN REDUCTION INTERNAL FIXATION (ORIF) LEFT OLECRANON FRACTURE;  Surgeon: Tarry KosNaiping M Xu, MD;  [facility];  Service: Orthopedics;  Laterality: Left;  . ORIF HUMERUS FRACTURE Left 09/13/2015   Procedure: OPEN REDUCTION INTERNAL FIXATION (ORIF) ELBOW/OLECRANON AND I AND D;  Surgeon: Tarry KosNaiping M Xu, MD;  [facility];  Service: Orthopedics;  Laterality: Left;  . TONSILLECTOMY      There were no vitals filed for this visit.      Subjective Assessment - 01/13/16 1604    Subjective Doing well today.   Patient Stated Goals get back motion and strength   Pain Score 2    Pain Location Elbow   Pain Orientation Left   Pain Descriptors / Indicators Sore   Pain Type Surgical pain   Pain Onset 1 to 4 weeks ago      Treatment:  UBE at 60 RPM's x 8 minutes; 7# bicep curls 4 sets of 10 reps; supination/pronation 7# 4 sets of 10 reps  f/b PROM into left elbow flexion and extension x 10 minutes including STW/M to left bicep. HMP and pre-mod e'stim to affected right elbow at 80-150 Hz x 15 minutes. Patient did great today.                   [facility] Adult PT Treatment/Exercise - 01/13/16 0001      Elbow Exercises   Elbow Flexion Strengthening   Forearm Pronation Limitations 7# 4 x 10 reps.                     PT Long Term Goals - 01/01/16 1645      PT LONG TERM GOAL #1   Title I with HEP   Time 4   Period Weeks   Status Achieved     PT LONG TERM GOAL #2   Title Improved L elbow flexion to 140 deg and extension to 155 deg or better to improve function.   Time 4   Period Weeks   Status Revised  AROM L elbow 15-130 deg 01/01/2016     PT LONG TERM GOAL #3   Title 5/5 L elbow strength to allow RTW   Time 4   Period Weeks   Status On-going  Plan - 01/13/16 1711    Consulted and Agree with Plan of Care Patient      Patient will benefit from skilled therapeutic intervention in order to improve the following deficits and impairments:  Decreased range of motion, Pain, Increased edema, Decreased strength  Visit Diagnosis: Stiffness of left elbow, not elsewhere classified  Pain in left elbow     Problem List Patient Active Problem List   Diagnosis Date Noted  . S/P ORIF (open reduction internal fixation) fracture 09/13/2015  . HTN (hypertension) 10/17/2012    Jovi Zavadil, [place] MPT 01/13/2016, 5:12 PM  [facility] [address] [place], [place], [address] Phone: [phone]   Fax:  [phone]  Name: Jacob Benson MRN: [id number] Date of Birth: October 22, 1985

## 2016-01-15 ENCOUNTER — Ambulatory Visit: Payer: 59 | Admitting: Physical Therapy

## 2016-01-15 DIAGNOSIS — M25522 Pain in left elbow: Secondary | ICD-10-CM

## 2016-01-15 DIAGNOSIS — M25622 Stiffness of left elbow, not elsewhere classified: Secondary | ICD-10-CM | POA: Diagnosis not present

## 2016-01-15 NOTE — Therapy (Signed)
[facility] [address] [place], [place], [address] Phone: [phone]   Fax:  [phone]  Physical Therapy Treatment  Patient Details  Name: Jacob Benson MRN: [id number] Date of Birth: April 29, 1986 Referring Provider: Gershon Mussel, MD  Encounter Date: 01/15/2016      PT End of Session - 01/15/16 1807    Visit Number 24   Number of Visits 400   Date for PT Re-Evaluation 01/29/16   PT Start Time 0400   PT Stop Time 0446   PT Time Calculation (min) 46 min   Activity Tolerance Patient tolerated treatment well   Behavior During Therapy [facility] for tasks assessed/performed      Past Medical History:  Diagnosis Date  . Calcium kidney stones 2007  . Hyperlipidemia   . Hypertension   . Olecranon fracture    left    Past Surgical History:  Procedure Laterality Date  . FRACTURE SURGERY    . I&D EXTREMITY Left 09/13/2015   Procedure: IRRIGATION AND DEBRIDEMENT EXTREMITY;  Surgeon: Tarry Kos, MD;  Location: [facility] OR;  Service: Orthopedics;  Laterality: Left;  . ORIF ELBOW FRACTURE Left 10/30/2015   Procedure: OPEN REDUCTION INTERNAL FIXATION (ORIF) LEFT OLECRANON FRACTURE;  Surgeon: Tarry Kos, MD;  Location: [facility];  Service: Orthopedics;  Laterality: Left;  . ORIF HUMERUS FRACTURE Left 09/13/2015   Procedure: OPEN REDUCTION INTERNAL FIXATION (ORIF) ELBOW/OLECRANON AND I AND D;  Surgeon: Tarry Kos, MD;  [facility];  Service: Orthopedics;  Laterality: Left;  . TONSILLECTOMY      There were no vitals filed for this visit.      Subjective Assessment - 01/15/16 1753    Subjective Doing good.   Pain Score 2    Pain Location Elbow   Pain Orientation Left   Pain Descriptors / Indicators Sore   Pain Type Surgical pain                         [facility] Adult PT Treatment/Exercise - 01/15/16 0001      Shoulder Exercises: Pulleys   Flexion Limitations 2 minutes.     Shoulder Exercises: ROM/Strengthening   UBE  (Upper Arm Bike) 30 RPM's x 10 minutes.     Programme researcher, broadcasting/film/video Location Left bicep and posterior left elbow    Electrical Stimulation Action Pre-mod 80-150 Hz (5 sec on and 5 sec off) x 15 minutes.     Manual Therapy   Manual Therapy Soft tissue mobilization   Passive ROM Left biceps STW/M while performing sustained left elbow extension x 11 minutes.                     PT Long Term Goals - 01/01/16 1645      PT LONG TERM GOAL #1   Title I with HEP   Time 4   Period Weeks   Status Achieved     PT LONG TERM GOAL #2   Title Improved L elbow flexion to 140 deg and extension to 155 deg or better to improve function.   Time 4   Period Weeks   Status Revised  AROM L elbow 15-130 deg 01/01/2016     PT LONG TERM GOAL #3   Title 5/5 L elbow strength to allow RTW   Time 4   Period Weeks   Status On-going             Patient will  benefit from skilled therapeutic intervention in order to improve the following deficits and impairments:  Decreased range of motion, Pain, Increased edema, Decreased strength  Visit Diagnosis: Stiffness of left elbow, not elsewhere classified  Pain in left elbow     Problem List Patient Active Problem List   Diagnosis Date Noted  . S/P ORIF (open reduction internal fixation) fracture 09/13/2015  . HTN (hypertension) 10/17/2012    Jeffifer Rabold, [place] MPT 01/15/2016, 6:09 PM  [facility] [address] [place], [place], [address] Phone: [phone]   Fax:  [phone]  Name: Jacob Benson MRN: [id number] Date of Birth: 1986/03/17

## 2016-01-20 ENCOUNTER — Encounter: Payer: Self-pay | Admitting: Physical Therapy

## 2016-01-20 ENCOUNTER — Ambulatory Visit: Payer: 59 | Attending: Orthopaedic Surgery | Admitting: Physical Therapy

## 2016-01-20 DIAGNOSIS — M25522 Pain in left elbow: Secondary | ICD-10-CM | POA: Diagnosis not present

## 2016-01-20 DIAGNOSIS — M25622 Stiffness of left elbow, not elsewhere classified: Secondary | ICD-10-CM

## 2016-01-20 NOTE — Therapy (Signed)
[facility] [address] [place], [place], [address] Phone: [phone]   Fax:  [phone]  Physical Therapy Treatment  Patient Details  Name: Jacob Benson MRN: [id number] Date of Birth: 05-20-1985 Referring Provider: Gershon MusselNaiping Xu, MD  Encounter Date: 01/20/2016      PT End of Session - 01/20/16 1602    Visit Number 25   Number of Visits 34   Date for PT Re-Evaluation 01/29/16   PT Start Time 1601   PT Stop Time 1644   PT Time Calculation (min) 43 min   Activity Tolerance Patient tolerated treatment well   Behavior During Therapy [facility] for tasks assessed/performed      Past Medical History:  Diagnosis Date  . Calcium kidney stones 2007  . Hyperlipidemia   . Hypertension   . Olecranon fracture    left    Past Surgical History:  Procedure Laterality Date  . FRACTURE SURGERY    . I&D EXTREMITY Left 09/13/2015   Procedure: IRRIGATION AND DEBRIDEMENT EXTREMITY;  Surgeon: Tarry KosNaiping M Xu, MD;  [facility];  Service: Orthopedics;  Laterality: Left;  . ORIF ELBOW FRACTURE Left 10/30/2015   Procedure: OPEN REDUCTION INTERNAL FIXATION (ORIF) LEFT OLECRANON FRACTURE;  Surgeon: Tarry KosNaiping M Xu, MD;  [facility];  Service: Orthopedics;  Laterality: Left;  . ORIF HUMERUS FRACTURE Left 09/13/2015   Procedure: OPEN REDUCTION INTERNAL FIXATION (ORIF) ELBOW/OLECRANON AND I AND D;  Surgeon: Tarry KosNaiping M Xu, MD;  [facility];  Service: Orthopedics;  Laterality: Left;  . TONSILLECTOMY      There were no vitals filed for this visit.      Subjective Assessment - 01/20/16 1602    Subjective Reports L elbow soreness as he hasn't had a day off from work since returning from vacation. Reports not really having time to rest from work.   Pertinent History HTN   Patient Stated Goals get back motion and strength   Currently in Pain? Yes   Pain Score 3    Pain Location Elbow   Pain Orientation Left   Pain Descriptors / Indicators Sore   Pain  Type Surgical pain   Pain Onset 1 to 4 weeks ago            [facility] - 01/20/16 0001      Assessment   Medical Diagnosis s/p ORIF L olecranon   Onset Date/Surgical Date 10/30/15   Hand Dominance Left   Next MD Visit 01/23/2016                     [facility]/Ex[facility] - 01/20/16 0001      Elbow Exercises   Elbow Flexion Strengthening;Both;Other reps (comment);Standing   Elbow Flexion Limitations Pink XTS 5x10 reps   Elbow Extension Strengthening;Both;Other reps (comment);Standing   Elbow Extension Limitations Pink XTS 5x10 reps   Forearm Supination Strengthening;Left;Other reps (comment);Seated;Bar weights/barbell   Forearm Supination Limitations 7# 4x10 reps   Forearm Pronation Strengthening;Left;Other reps (comment);Seated;Bar weights/barbell   Forearm Pronation Limitations 7# 4x10 reps   Wrist Flexion Strengthening;Left;Other reps (comment);Seated;Bar weights/barbell   Bar Weights/Barbell (Wrist Flexion) Other (comment)  7#   Wrist Flexion Limitations 4x10 reps   Wrist Extension Strengthening;Left;Other reps (comment);Seated;Bar weights/barbell   Bar Weights/Barbell (Wrist Extension) Other (comment)  7#   Wrist Extension Limitations 4x10 reps   Other elbow exercises L wrist radial/ulnar deviation 7# 4x10 reps each     Shoulder Exercises: Seated   Extension Strengthening;Left;Weights  4x10  reps   Extension Weight (lbs) 7   Flexion Strengthening;Left;Weights  4x10 reps   Flexion Weight (lbs) 7     Shoulder Exercises: Pulleys   Flexion Other (comment)  x5 min     Shoulder Exercises: ROM/Strengthening   UBE (Upper Arm Bike) 30 RPM's x 10 minutes.     Modalities   Modalities Vasopneumatic     Vasopneumatic   Number Minutes Vasopneumatic  15 minutes   Vasopnuematic Location  Other (comment)  L elbow   Vasopneumatic Pressure Low   Vasopneumatic Temperature  40                     PT Long Term Goals - 01/01/16  1645      PT LONG TERM GOAL #1   Title I with HEP   Time 4   Period Weeks   Status Achieved     PT LONG TERM GOAL #2   Title Improved L elbow flexion to 140 deg and extension to 155 deg or better to improve function.   Time 4   Period Weeks   Status Revised  AROM L elbow 15-130 deg 01/01/2016     PT LONG TERM GOAL #3   Title 5/5 L elbow strength to allow RTW   Time 4   Period Weeks   Status On-going               Plan - 01/20/16 1632    Clinical Impression Statement Patient continues to tolerate LUE strengthening with increased resistance with no reports of adverse symptoms. Patient only required minimal multimodal cueing for proper exercise technique. Goals remain on-going at this time regarding L elbow strength and ROM. Normal vasopneumatic response noted following removal of the modality.    Rehab Potential Excellent   PT Frequency 3x / week   PT Duration 4 weeks   PT Treatment/Interventions ADLs/Self Care Home Management;Cryotherapy;Electrical Stimulation;Therapeutic exercise;Patient/family education;Manual techniques;Vasopneumatic Device;Passive range of motion;Neuromuscular re-education;Scar mobilization   PT Next Visit Plan Continue with L elbow, wrist, forearm strengthening, ROM activities per MD note in media. Assess L elbow ROM and strength goals with MD note next treatment.   PT Home Exercise Plan scar massage, AROM wrist and hand; L elbow, wrist, forearm strengthening with yellow theraband/ green    Consulted and Agree with Plan of Care Patient      Patient will benefit from skilled therapeutic intervention in order to improve the following deficits and impairments:  Decreased range of motion, Pain, Increased edema, Decreased strength  Visit Diagnosis: Stiffness of left elbow, not elsewhere classified  Pain in left elbow     Problem List Patient Active Problem List   Diagnosis Date Noted  . S/P ORIF (open reduction internal fixation) fracture  09/13/2015  . HTN (hypertension) 10/17/2012    Evelene Croon, PTA 01/20/2016, 4:46 PM  [facility] [address] [place], [place], [address] Phone: [phone]   Fax:  [phone]  Name: Jacob Benson MRN: [id number] Date of Birth: 01-18-1986

## 2016-01-22 ENCOUNTER — Ambulatory Visit: Payer: 59 | Admitting: Physical Therapy

## 2016-01-22 ENCOUNTER — Encounter: Payer: Self-pay | Admitting: Physical Therapy

## 2016-01-22 DIAGNOSIS — M25522 Pain in left elbow: Secondary | ICD-10-CM | POA: Diagnosis not present

## 2016-01-22 DIAGNOSIS — M25622 Stiffness of left elbow, not elsewhere classified: Secondary | ICD-10-CM

## 2016-01-22 NOTE — Therapy (Addendum)
[facility] [place], [place], [address] Phone: [phone]   Fax:  [phone]  Physical Therapy Treatment  Patient Details  Name: Jacob Benson MRN: [id number] Date of Birth: 1985-12-22 Referring Provider: Frankey Shown, MD  Encounter Date: 01/22/2016    Past Medical History:  Diagnosis Date  . Calcium kidney stones 2007  . Hyperlipidemia   . Hypertension   . Olecranon fracture    left    Past Surgical History:  Procedure Laterality Date  . FRACTURE SURGERY    . I&D EXTREMITY Left 09/13/2015   Procedure: IRRIGATION AND DEBRIDEMENT EXTREMITY;  Surgeon: Leandrew Koyanagi, MD;  Location: [place];  Service: Orthopedics;  Laterality: Left;  . ORIF ELBOW FRACTURE Left 10/30/2015   Procedure: OPEN REDUCTION INTERNAL FIXATION (ORIF) LEFT OLECRANON FRACTURE;  Surgeon: Leandrew Koyanagi, MD;  Location: [place];  Service: Orthopedics;  Laterality: Left;  . ORIF HUMERUS FRACTURE Left 09/13/2015   Procedure: OPEN REDUCTION INTERNAL FIXATION (ORIF) ELBOW/OLECRANON AND I AND D;  Surgeon: Leandrew Koyanagi, MD;  Location: [place];  Service: Orthopedics;  Laterality: Left;  . TONSILLECTOMY      There were no vitals filed for this visit.                                    PT Long Term Goals - 01/22/16 1635      PT LONG TERM GOAL #1   Title I with HEP   Time 4   Period Weeks   Status Achieved     PT LONG TERM GOAL #2   Title Improved L elbow flexion to 140 deg and extension to 155 deg or better to improve function.   Time 4   Period Weeks   Status Revised  AROM L elbow 14-131 deg 01/22/2016     PT LONG TERM GOAL #3   Title 5/5 L elbow strength to allow RTW   Time 4   Period Weeks   Status Achieved  L elbow flex/ext MMT 5/5 01/22/2016             Patient will benefit from skilled therapeutic intervention in order to improve the following deficits and impairments:  Decreased range of motion, Pain, Increased  edema, Decreased strength  Visit Diagnosis: Stiffness of left elbow, not elsewhere classified  Pain in left elbow     Problem List Patient Active Problem List   Diagnosis Date Noted  . S/P ORIF (open reduction internal fixation) fracture 09/13/2015  . HTN (hypertension) 10/17/2012    Ahmed Prima, PTA 04/15/16 6:47 PM Mali Applegate MPT [facility] [address] [place], [place], [address] Phone: [phone]   Fax:  [phone]  Name: Jacob Benson MRN: [id number] Date of Birth: 02-15-1986  PHYSICAL THERAPY DISCHARGE SUMMARY  Visits from Start of Care: 26.  Current functional level related to goals / functional outcomes: See above.   Remaining deficits: Minimal loss of left elbow range of motion.   Education / Equipment: HEP. Plan: Patient agrees to discharge.  Patient goals were partially met. Patient is being discharged due to being pleased with the current functional level.  ?????         Mali Applegate MPT

## 2016-01-23 DIAGNOSIS — S52022D Displaced fracture of olecranon process without intraarticular extension of left ulna, subsequent encounter for closed fracture with routine healing: Secondary | ICD-10-CM | POA: Diagnosis not present

## 2016-02-18 ENCOUNTER — Ambulatory Visit (INDEPENDENT_AMBULATORY_CARE_PROVIDER_SITE_OTHER): Payer: 59 | Admitting: Family Medicine

## 2016-02-18 ENCOUNTER — Encounter: Payer: Self-pay | Admitting: Family Medicine

## 2016-02-18 VITALS — BP 129/82 | HR 64 | Temp 97.0°F | Ht 70.0 in | Wt 194.0 lb

## 2016-02-18 DIAGNOSIS — I10 Essential (primary) hypertension: Secondary | ICD-10-CM

## 2016-02-18 DIAGNOSIS — J310 Chronic rhinitis: Secondary | ICD-10-CM

## 2016-02-18 DIAGNOSIS — E785 Hyperlipidemia, unspecified: Secondary | ICD-10-CM

## 2016-02-18 DIAGNOSIS — Z0000 Encounter for general adult medical examination without abnormal findings: Secondary | ICD-10-CM

## 2016-02-18 DIAGNOSIS — J301 Allergic rhinitis due to pollen: Secondary | ICD-10-CM

## 2016-02-18 DIAGNOSIS — J329 Chronic sinusitis, unspecified: Secondary | ICD-10-CM

## 2016-02-18 DIAGNOSIS — J029 Acute pharyngitis, unspecified: Secondary | ICD-10-CM

## 2016-02-18 MED ORDER — AMOXICILLIN 500 MG PO CAPS: 500.0000 mg | ORAL_CAPSULE | Freq: Three times a day (TID) | ORAL | 0 refills | Status: DC

## 2016-02-18 MED ORDER — TRIAMCINOLONE ACETONIDE 55 MCG/ACT NA AERO: 1.0000 | INHALATION_SPRAY | Freq: Every day | NASAL | 6 refills | Status: DC

## 2016-02-18 NOTE — Progress Notes (Signed)
Subjective:    Patient ID: Jacob Benson, male    DOB: May 26, 1985, 30 y.o.   MRN: [id number]  HPI Patient is here today for annual wellness exam and follow up of chronic medical problems which includes hyperlipidemia and hypertension. He is taking medications regularly.Since the patient's last visit, he has had a fall while playing with his son at home and a fracture of his elbow. He did well with the surgery and then fell again and reinjured the elbow and had to have repeat surgery for this. He is doing much better and is back to work. He complains today of some sinus issues in addition to getting his physical exam. He had an EKG and a chest x-ray a year ago and this will not be repeated. He did fecal occult blood test card in April of this year and it was negative. A she denies any chest pain or shortness of breath. He denies any trouble with heartburn especially since he's been on the Nexium, with swallowing change in bowel habits etc. He denies any trouble with blood in the stool or black tarry bowel movements. He did have an FOBT back in the spring and this was negative. He's passing his water without problems and he has no problems with his sex life. Reviewing his family history both his parents are alive and well with no significant health issues and he has a sister that is also in good health and she is younger than him. He's recovered well from the elbow surgery which is on the left side and other than using it during the day a lot and playing golf it may be sore after using it but otherwise he is doing well with this. He has the biggest complaint with his sinus and sinus congestion and he is been intolerant to Flonase in that it causes a sore throat when he uses it. We recommended he try a different nasal steroid to see if he can tolerate that better and continue with the Claritin and Astelin as he is currently doing. He says his allergy seasons or worse in the spring and the fall with the fall  being worse than the spring. He has no joint issues and no headaches just head congestion and his ears feel stopped up.     Patient Active Problem List   Diagnosis Date Noted  . S/P ORIF (open reduction internal fixation) fracture 09/13/2015  . HTN (hypertension) 10/17/2012   Outpatient Encounter Prescriptions as of 02/18/2016  Medication Sig  . amLODipine (NORVASC) 5 MG tablet Take 5 mg by mouth daily.  Marland Kitchen azelastine (ASTELIN) 0.1 % nasal spray Place 2 sprays into both nostrils at bedtime. Use in each nostril as directed  . esomeprazole (NEXIUM) 40 MG capsule Take 1 capsule (40 mg total) by mouth daily.  . hydrochlorothiazide (MICROZIDE) 12.5 MG capsule Take 12.5 mg by mouth daily.  Marland Kitchen loratadine (CLARITIN) 10 MG tablet Take 10 mg by mouth daily.  . [DISCONTINUED] HYDROcodone-acetaminophen (NORCO) 7.5-325 MG tablet Take 1-2 tablets by mouth every 6 (six) hours as needed for moderate pain.  . [DISCONTINUED] ibuprofen (ADVIL,MOTRIN) 200 MG tablet Take 600 mg by mouth 3 (three) times daily.  . [DISCONTINUED] meclizine (ANTIVERT) 25 MG tablet Take 1 tablet (25 mg total) by mouth 3 (three) times daily as needed for dizziness. (Patient not taking: Reported on 11/20/2015)  . [DISCONTINUED] methocarbamol (ROBAXIN) 750 MG tablet Take 1 tablet (750 mg total) by mouth 2 (two) times daily as needed for  muscle spasms. (Patient not taking: Reported on 11/20/2015)  . [DISCONTINUED] omeprazole (PRILOSEC OTC) 20 MG tablet Take 20 mg by mouth daily.  . [DISCONTINUED] ondansetron (ZOFRAN) 4 MG tablet Take 1-2 tablets (4-8 mg total) by mouth every 8 (eight) hours as needed for nausea or vomiting.  . [DISCONTINUED] psyllium (METAMUCIL) 0.52 g capsule Take 0.52 g by mouth every morning.  . [DISCONTINUED] senna-docusate (SENOKOT S) 8.6-50 MG tablet Take 1 tablet by mouth at bedtime as needed. (Patient not taking: Reported on 11/20/2015)  . [DISCONTINUED] senna-docusate (SENOKOT S) 8.6-50 MG tablet Take 1 tablet by mouth  at bedtime as needed. (Patient not taking: Reported on 11/20/2015)   No facility-administered encounter medications on file as of 02/18/2016.       Review of Systems  Constitutional: Negative.   HENT: Positive for sinus pressure.   Eyes: Negative.   Respiratory: Negative.   Cardiovascular: Negative.   Gastrointestinal: Negative.   Endocrine: Negative.   Genitourinary: Negative.   Musculoskeletal: Negative.   Skin: Negative.   Allergic/Immunologic: Negative.   Neurological: Negative.   Hematological: Negative.   Psychiatric/Behavioral: Negative.        Objective:   Physical Exam  Constitutional: He is oriented to person, place, and time. He appears well-developed and well-nourished. No distress.  HENT:  Head: Normocephalic and atraumatic.  Right Ear: External ear normal.  Left Ear: External ear normal.  Mouth/Throat: Oropharynx is clear and moist. No oropharyngeal exudate.  Nasal pallor and turbinate congestion bilaterally and ethmoid tenderness to palpation  Eyes: Conjunctivae and EOM are normal. Pupils are equal, round, and reactive to light. Right eye exhibits no discharge. Left eye exhibits no discharge. No scleral icterus.  Neck: Normal range of motion. Neck supple. No thyromegaly present.  No anterior cervical adenopathy and no thyromegaly  Cardiovascular: Normal rate, regular rhythm, normal heart sounds and intact distal pulses.   No murmur heard. Heart is regular at 72/m  Pulmonary/Chest: Effort normal and breath sounds normal. No respiratory distress. He has no wheezes. He has no rales. He exhibits no tenderness.  No axillary adenopathy and lungs were clear anteriorly and posteriorly  Abdominal: Soft. Bowel sounds are normal. He exhibits no mass. There is no tenderness. There is no rebound and no guarding.  No abdominal tenderness liver or spleen enlargement or inguinal adenopathy  Genitourinary: Penis normal.  Genitourinary Comments: The prostate exam was not done  today. There are was no sign of any inguinal hernias or inguinal adenopathy and the external genitalia were within normal limits.  Musculoskeletal: Normal range of motion. He exhibits deformity. He exhibits no edema.  The patient does have slightly limited range of motion of the left elbow because of surgeries and unable to fully extend the forearm.  Lymphadenopathy:    He has no cervical adenopathy.  Neurological: He is alert and oriented to person, place, and time. He has normal reflexes. No cranial nerve deficit.  Skin: Skin is warm and dry. No rash noted.  Psychiatric: He has a normal mood and affect. His behavior is normal. Judgment and thought content normal.  Nursing note and vitals reviewed.  BP 129/82 (BP Location: Right Arm)   Pulse 64   Temp 97 F (36.1 C) (Oral)   Ht 5' 10" (1.778 m)   Wt 194 lb (88 kg)   BMI 27.84 kg/m         Assessment & Plan:  1. Annual physical exam -Fairly normal exam other than rhinosinusitis and allergic rhinitis an inability to  fully extend left forearm secondary to multiple surgeries on left elbow. - BMP8+EGFR - CBC with Differential/Platelet - Hepatic function panel - Lipid panel - VITAMIN D 25 Hydroxy (Vit-D Deficiency, Fractures)  2. Hyperlipidemia, unspecified hyperlipidemia type -Continue with aggressive therapeutic lifestyle changes pending results of lab work - CBC with Differential/Platelet - Hepatic function panel - Lipid panel  3. Essential hypertension -Continue with current treatment pending results of lab work sodium restriction and weight management - BMP8+EGFR - CBC with Differential/Platelet - Hepatic function panel  4. Chronic seasonal allergic rhinitis due to pollen -We'll try Nasacort AQ and he will continue with Claritin and Astelin  5. Rhinosinusitis -Amoxicillin 503 times a day for 10 days  6. Sore throat in the morning -Use allergy medicines and drink plenty of fluids and stay well hydrated   Meds  ordered this encounter  Medications  . hydrochlorothiazide (MICROZIDE) 12.5 MG capsule    Sig: Take 12.5 mg by mouth daily.  Marland Kitchen amLODipine (NORVASC) 5 MG tablet    Sig: Take 5 mg by mouth daily.  Marland Kitchen amoxicillin (AMOXIL) 500 MG capsule    Sig: Take 1 capsule (500 mg total) by mouth 3 (three) times daily.    Dispense:  30 capsule    Refill:  0  . triamcinolone (NASACORT AQ) 55 MCG/ACT AERO nasal inhaler    Sig: Place 1-2 sprays into the nose daily.    Dispense:  1 Inhaler    Refill:  6   Patient Instructions  Continue current medications. Continue good therapeutic lifestyle changes which include good diet and exercise. Fall precautions discussed with patient. If an FOBT was given today- please return it to our front desk.   **Flu shots are available--- please call and schedule a FLU-CLINIC appointment**  After your visit with [place] today you will receive a survey in the mail or online from [employer] regarding your care with [place]. Please take a moment to fill this out. Your feedback is very important to [place] as you can help [place] better understand your patient needs as well as improve your experience and satisfaction. WE CARE ABOUT YOU!!!   Take the antibiotic as directed and until completed Try the Nasacort AQ new one spray each nostril daily before dinner Continue with the Claritin and Astelin Use nasal saline during the day If you're not better with the congestion and a couple weeks please give [place] a call back and let us know this. Drink plenty of fluids and watch sodium intake and stay as active as physically possible and keep the weight down as much as possible    Arrie Senate MD

## 2016-02-18 NOTE — Patient Instructions (Addendum)
Continue current medications. Continue good therapeutic lifestyle changes which include good diet and exercise. Fall precautions discussed with patient. If an FOBT was given today- please return it to our front desk.   **Flu shots are available--- please call and schedule a FLU-CLINIC appointment**  After your visit with us today you will receive a survey in the mail or online from [employer] regarding your care with us. Please take a moment to fill this out. Your feedback is very important to us as you can help us better understand your patient needs as well as improve your experience and satisfaction. WE CARE ABOUT YOU!!!   Take the antibiotic as directed and until completed Try the Nasacort AQ new one spray each nostril daily before dinner Continue with the Claritin and Astelin Use nasal saline during the day If you're not better with the congestion and a couple weeks please give us a call back and let us know this. Drink plenty of fluids and watch sodium intake and stay as active as physically possible and keep the weight down as much as possible

## 2016-02-19 LAB — BMP8+EGFR
BUN/Creatinine Ratio: 21 — ABNORMAL HIGH (ref 9–20)
BUN: 24 mg/dL — ABNORMAL HIGH (ref 6–20)
CO2: 27 mmol/L (ref 18–29)
Calcium: 9.3 mg/dL (ref 8.7–10.2)
Chloride: 101 mmol/L (ref 96–106)
Creatinine, Ser: 1.16 mg/dL (ref 0.76–1.27)
GFR, EST AFRICAN AMERICAN: 98 mL/min/{1.73_m2} (ref >59)
GFR, EST NON AFRICAN AMERICAN: 85 mL/min/{1.73_m2} (ref >59)
Glucose: 84 mg/dL (ref 65–99)
POTASSIUM: 4.2 mmol/L (ref 3.5–5.2)
SODIUM: 141 mmol/L (ref 134–144)

## 2016-02-19 LAB — LIPID PANEL
CHOLESTEROL TOTAL: 210 mg/dL — AB (ref 100–199)
Chol/HDL Ratio: 5.4 ratio units — ABNORMAL HIGH (ref 0.0–5.0)
HDL: 39 mg/dL — AB (ref >39)
LDL Calculated: 135 mg/dL — ABNORMAL HIGH (ref 0–99)
Triglycerides: 182 mg/dL — ABNORMAL HIGH (ref 0–149)
VLDL CHOLESTEROL CAL: 36 mg/dL (ref 5–40)

## 2016-02-19 LAB — CBC WITH DIFFERENTIAL/PLATELET
BASOS: 0 %
Basophils Absolute: 0 10*3/uL (ref 0.0–0.2)
EOS (ABSOLUTE): 0.1 10*3/uL (ref 0.0–0.4)
Eos: 2 %
Hematocrit: 44.7 % (ref 37.5–51.0)
Hemoglobin: 15.5 g/dL (ref 12.6–17.7)
Immature Grans (Abs): 0 10*3/uL (ref 0.0–0.1)
Immature Granulocytes: 0 %
Lymphocytes Absolute: 0.9 10*3/uL (ref 0.7–3.1)
Lymphs: 13 %
MCH: 29.4 pg (ref 26.6–33.0)
MCHC: 34.7 g/dL (ref 31.5–35.7)
MCV: 85 fL (ref 79–97)
MONOS ABS: 0.9 10*3/uL (ref 0.1–0.9)
Monocytes: 13 %
NEUTROS ABS: 4.8 10*3/uL (ref 1.4–7.0)
NEUTROS PCT: 72 %
PLATELETS: 242 10*3/uL (ref 150–379)
RBC: 5.28 x10E6/uL (ref 4.14–5.80)
RDW: 14 % (ref 12.3–15.4)
WBC: 6.8 10*3/uL (ref 3.4–10.8)

## 2016-02-19 LAB — HEPATIC FUNCTION PANEL
ALT: 24 IU/L (ref 0–44)
AST: 21 IU/L (ref 0–40)
Albumin: 4.3 g/dL (ref 3.5–5.5)
Alkaline Phosphatase: 90 IU/L (ref 39–117)
Bilirubin Total: 0.4 mg/dL (ref 0.0–1.2)
Bilirubin, Direct: 0.09 mg/dL (ref 0.00–0.40)
Total Protein: 6.5 g/dL (ref 6.0–8.5)

## 2016-02-19 LAB — VITAMIN D 25 HYDROXY (VIT D DEFICIENCY, FRACTURES): Vit D, 25-Hydroxy: 49.8 ng/mL (ref 30.0–100.0)

## 2016-02-23 ENCOUNTER — Ambulatory Visit: Payer: 59 | Admitting: Family Medicine

## 2016-03-01 DIAGNOSIS — M62830 Muscle spasm of back: Secondary | ICD-10-CM | POA: Diagnosis not present

## 2016-03-01 DIAGNOSIS — M9903 Segmental and somatic dysfunction of lumbar region: Secondary | ICD-10-CM | POA: Diagnosis not present

## 2016-03-05 ENCOUNTER — Ambulatory Visit (INDEPENDENT_AMBULATORY_CARE_PROVIDER_SITE_OTHER): Payer: 59

## 2016-03-05 DIAGNOSIS — Z23 Encounter for immunization: Secondary | ICD-10-CM

## 2016-03-23 MED FILL — ESOMEPRAZOLE MAG DR 40 MG C: 40 | 90 days supply | Qty: 90 | Fill #1

## 2016-03-27 ENCOUNTER — Other Ambulatory Visit: Payer: Self-pay | Admitting: Family Medicine

## 2016-03-27 DIAGNOSIS — I10 Essential (primary) hypertension: Secondary | ICD-10-CM

## 2016-04-14 ENCOUNTER — Other Ambulatory Visit: Payer: Self-pay

## 2016-04-14 MED ORDER — ESOMEPRAZOLE MAGNESIUM 40 MG PO CPDR: 40.0000 mg | DELAYED_RELEASE_CAPSULE | Freq: Every day | ORAL | 1 refills | Status: DC

## 2016-05-24 ENCOUNTER — Encounter: Payer: Self-pay | Admitting: Nurse Practitioner

## 2016-05-24 ENCOUNTER — Ambulatory Visit (INDEPENDENT_AMBULATORY_CARE_PROVIDER_SITE_OTHER): Payer: 59 | Admitting: Nurse Practitioner

## 2016-05-24 VITALS — BP 128/75 | HR 77 | Temp 97.0°F | Ht 70.0 in | Wt 192.0 lb

## 2016-05-24 DIAGNOSIS — J0100 Acute maxillary sinusitis, unspecified: Secondary | ICD-10-CM | POA: Diagnosis not present

## 2016-05-24 MED ORDER — AZITHROMYCIN 250 MG PO TABS: ORAL_TABLET | ORAL | 0 refills | Status: DC

## 2016-05-24 NOTE — Progress Notes (Signed)
Subjective:     Jacob Benson is a 31 y.o. male who presents for evaluation of sinus pain. Symptoms include: clear rhinorrhea, congestion, cough, headaches, nasal congestion, sinus pressure and sore throat. Onset of symptoms was 3 days ago. Symptoms have been gradually worsening since that time. Past history is significant for no history of pneumonia or bronchitis. Patient is a non-smoker.  The following portions of the patient's history were reviewed and updated as appropriate: allergies, current medications, past family history, past medical history, past social history, past surgical history and problem list.  Review of Systems Pertinent items noted in HPI and remainder of comprehensive ROS otherwise negative.   Objective:    BP 128/75   Pulse 77   Temp 97 F (36.1 C) (Oral)   Ht 5\' 10"  (1.778 m)   Wt 192 lb (87.1 kg)   BMI 27.55 kg/m  General appearance: alert, cooperative and no distress Eyes: conjunctivae/corneas clear. PERRL, EOM's intact. Fundi benign. Ears: normal TM's and external ear canals both ears Nose: clear discharge, moderate congestion, turbinates red, sinus tenderness bilateral Throat: lips, mucosa, and tongue normal; teeth and gums normal Neck: no adenopathy, no carotid bruit, no JVD, supple, symmetrical, trachea midline and thyroid not enlarged, symmetric, no tenderness/mass/nodules Lungs: clear to auscultation bilaterally Heart: regular rate and rhythm, S1, S2 normal, no murmur, click, rub or gallop    Assessment:    Acute bacterial sinusitis.    Plan:   1. Take meds as prescribed 2. Use a cool mist humidifier especially during the winter months and when heat has been humid. 3. Use saline nose sprays frequently 4. Saline irrigations of the nose can be very helpful if done frequently.  * 4X daily for 1 week*  * Use of a nettie pot can be helpful with this. Follow directions with this* 5. Drink plenty of fluids 6. Keep thermostat turn down low 7.For  any cough or congestion  Use plain Mucinex- regular strength or max strength is fine   * Children- consult with Pharmacist for dosing 8. For fever or aces or pains- take tylenol or ibuprofen appropriate for age and weight.  * for fevers greater than 101 orally you may alternate ibuprofen and tylenol every  3 hours.   Meds ordered this encounter  Medications  . azithromycin (ZITHROMAX) 250 MG tablet    Sig: Two tablets day one, then one tablet daily next 4 days.    Dispense:  6 tablet    Refill:  0    Order Specific Question:   Supervising Provider    Answer:   Johna SheriffVINCENT, CAROL L [[id number]]   Mary-Margaret Daphine DeutscherMartin, FNP

## 2016-05-24 NOTE — Patient Instructions (Signed)

## 2016-06-09 DIAGNOSIS — S0501XA Injury of conjunctiva and corneal abrasion without foreign body, right eye, initial encounter: Secondary | ICD-10-CM | POA: Diagnosis not present

## 2016-06-09 DIAGNOSIS — T1501XA Foreign body in cornea, right eye, initial encounter: Secondary | ICD-10-CM | POA: Diagnosis not present

## 2016-06-12 ENCOUNTER — Other Ambulatory Visit: Payer: Self-pay | Admitting: Pediatrics

## 2016-06-12 DIAGNOSIS — Z20828 Contact with and (suspected) exposure to other viral communicable diseases: Secondary | ICD-10-CM

## 2016-06-12 MED ORDER — OSELTAMIVIR PHOSPHATE 75 MG PO CAPS: 75.0000 mg | ORAL_CAPSULE | Freq: Every day | ORAL | 0 refills | Status: AC

## 2016-06-16 DIAGNOSIS — S0501XD Injury of conjunctiva and corneal abrasion without foreign body, right eye, subsequent encounter: Secondary | ICD-10-CM | POA: Diagnosis not present

## 2016-06-22 ENCOUNTER — Other Ambulatory Visit: Payer: Self-pay | Admitting: *Deleted

## 2016-06-22 MED ORDER — ESOMEPRAZOLE MAGNESIUM 40 MG PO CPDR: 40.0000 mg | DELAYED_RELEASE_CAPSULE | Freq: Every day | ORAL | 1 refills | Status: DC

## 2016-06-22 MED FILL — ESOMEPRAZOLE MAG DR 40 MG C: 40 | 90 days supply | Qty: 90 | Fill #0 | Status: TO

## 2016-07-05 ENCOUNTER — Other Ambulatory Visit: Payer: Self-pay

## 2016-07-05 MED ORDER — OSELTAMIVIR PHOSPHATE 75 MG PO CAPS: 75.0000 mg | ORAL_CAPSULE | Freq: Every day | ORAL | 0 refills | Status: AC

## 2016-08-03 ENCOUNTER — Other Ambulatory Visit: Payer: Self-pay

## 2016-08-03 MED ORDER — AMLODIPINE BESYLATE 5 MG PO TABS: 5.0000 mg | ORAL_TABLET | Freq: Every day | ORAL | 1 refills | Status: DC

## 2016-08-03 MED FILL — AMLODIPINE BESYLATE 5 MG TA: 5 | 90 days supply | Qty: 90 | Fill #0 | Status: TO

## 2016-08-09 ENCOUNTER — Other Ambulatory Visit: Payer: Self-pay | Admitting: *Deleted

## 2016-08-09 DIAGNOSIS — M9903 Segmental and somatic dysfunction of lumbar region: Secondary | ICD-10-CM | POA: Diagnosis not present

## 2016-08-09 DIAGNOSIS — M62830 Muscle spasm of back: Secondary | ICD-10-CM | POA: Diagnosis not present

## 2016-08-09 MED ORDER — HYDROCHLOROTHIAZIDE 12.5 MG PO CAPS: 12.5000 mg | ORAL_CAPSULE | Freq: Every day | ORAL | 1 refills | Status: DC

## 2016-08-09 MED FILL — HYDROCHLOROTHIAZIDE 12.5 MG: 12.5 | 90 days supply | Qty: 90 | Fill #0 | Status: TO

## 2016-08-19 ENCOUNTER — Encounter: Payer: Self-pay | Admitting: Family Medicine

## 2016-08-19 ENCOUNTER — Ambulatory Visit (INDEPENDENT_AMBULATORY_CARE_PROVIDER_SITE_OTHER): Payer: 59 | Admitting: Family Medicine

## 2016-08-19 ENCOUNTER — Ambulatory Visit: Payer: 59 | Admitting: Family Medicine

## 2016-08-19 VITALS — BP 131/80 | HR 62 | Temp 97.0°F | Ht 70.0 in | Wt 191.8 lb

## 2016-08-19 DIAGNOSIS — J309 Allergic rhinitis, unspecified: Secondary | ICD-10-CM | POA: Insufficient documentation

## 2016-08-19 DIAGNOSIS — K219 Gastro-esophageal reflux disease without esophagitis: Secondary | ICD-10-CM | POA: Diagnosis not present

## 2016-08-19 DIAGNOSIS — I10 Essential (primary) hypertension: Secondary | ICD-10-CM

## 2016-08-19 DIAGNOSIS — J301 Allergic rhinitis due to pollen: Secondary | ICD-10-CM

## 2016-08-19 MED ORDER — MONTELUKAST SODIUM 10 MG PO TABS
10.0000 mg | ORAL_TABLET | Freq: Every day | ORAL | 1 refills | Status: DC
Start: 2016-08-19 — End: 2016-09-20

## 2016-08-19 NOTE — Patient Instructions (Signed)
Great to meet you!  Lets follow up in 6 months  Start Singulair with zyrtec for sinuses  Consider nasal saline gel several times a day to help moisturize your nose.

## 2016-08-19 NOTE — Progress Notes (Signed)
   HPI  Patient presents today here to follow-up for hypertension.  Hypertension Good medication compliance with no side effects. No headache, chest pain.  GERD Good improvement with PPI, had symptoms of grossly 4 days a week prior to that.  Rhinitis. Allergic rhinitis, helped by Zyrtec but incompletely. Cannot tolerate Flonase or Nasacort. Cannot tolerate nasal saline rinses.   PMH: Smoking status noted ROS: Per HPI  Objective: BP 131/80   Pulse 62   Temp 97 F (36.1 C) (Oral)   Ht  (1.778 m)   Wt 191 lb 12.8 oz (87 kg)   BMI 27.52 kg/m  Gen: NAD, alert, cooperative with exam HEENT: NCAT, cobblestoning in the oropharynx, nares with swollen turbinates bilaterally CV: RRR, good S1/S2, no murmur Resp: CTABL, no wheezes, non-labored Ext: No edema, warm Neuro: Alert and oriented, No gross deficits  Assessment and plan:  # Hypertension On HCTZ and amlodipine, controlled No changes Lab annual labs, repeat in the fall.  # GERD Controlled PPI Continue  # Chronic seasonal allergic rhinitis Continue Zyrtec, adding single layer No signs of bacterial sinusitis   Meds ordered this encounter  Medications  . montelukast (SINGULAIR) 10 MG tablet    Sig: Take 1 tablet (10 mg total) by mouth at bedtime.    Dispense:  90 tablet    Refill:  1    Murtis Sink, MD [place] 08/19/2016, 8:15 AM

## 2016-09-20 ENCOUNTER — Other Ambulatory Visit: Payer: Self-pay | Admitting: *Deleted

## 2016-09-20 ENCOUNTER — Other Ambulatory Visit: Payer: Self-pay

## 2016-09-20 MED ORDER — ESOMEPRAZOLE MAGNESIUM 40 MG PO CPDR: 40.0000 mg | DELAYED_RELEASE_CAPSULE | Freq: Every day | ORAL | 1 refills | Status: DC

## 2016-09-20 MED ORDER — MONTELUKAST SODIUM 10 MG PO TABS: 10.0000 mg | ORAL_TABLET | Freq: Every day | ORAL | 1 refills | Status: DC

## 2016-09-20 MED FILL — ESOMEPRAZOLE MAG DR 40 MG C: 40 | 90 days supply | Qty: 90 | Fill #0

## 2016-09-20 MED FILL — MONTELUKAST SOD 10 MG TAB: 10 | 90 days supply | Qty: 90 | Fill #0

## 2016-10-01 IMAGING — CR DG ELBOW COMPLETE 3+V*L*
4 series · 4 of 4 positions shown · non-contrast
Comparison: None.

CLINICAL DATA: Fall from 10 feet, severe left oval pain.

EXAM:
LEFT ELBOW - COMPLETE 3+ VIEW

[elbow ap]
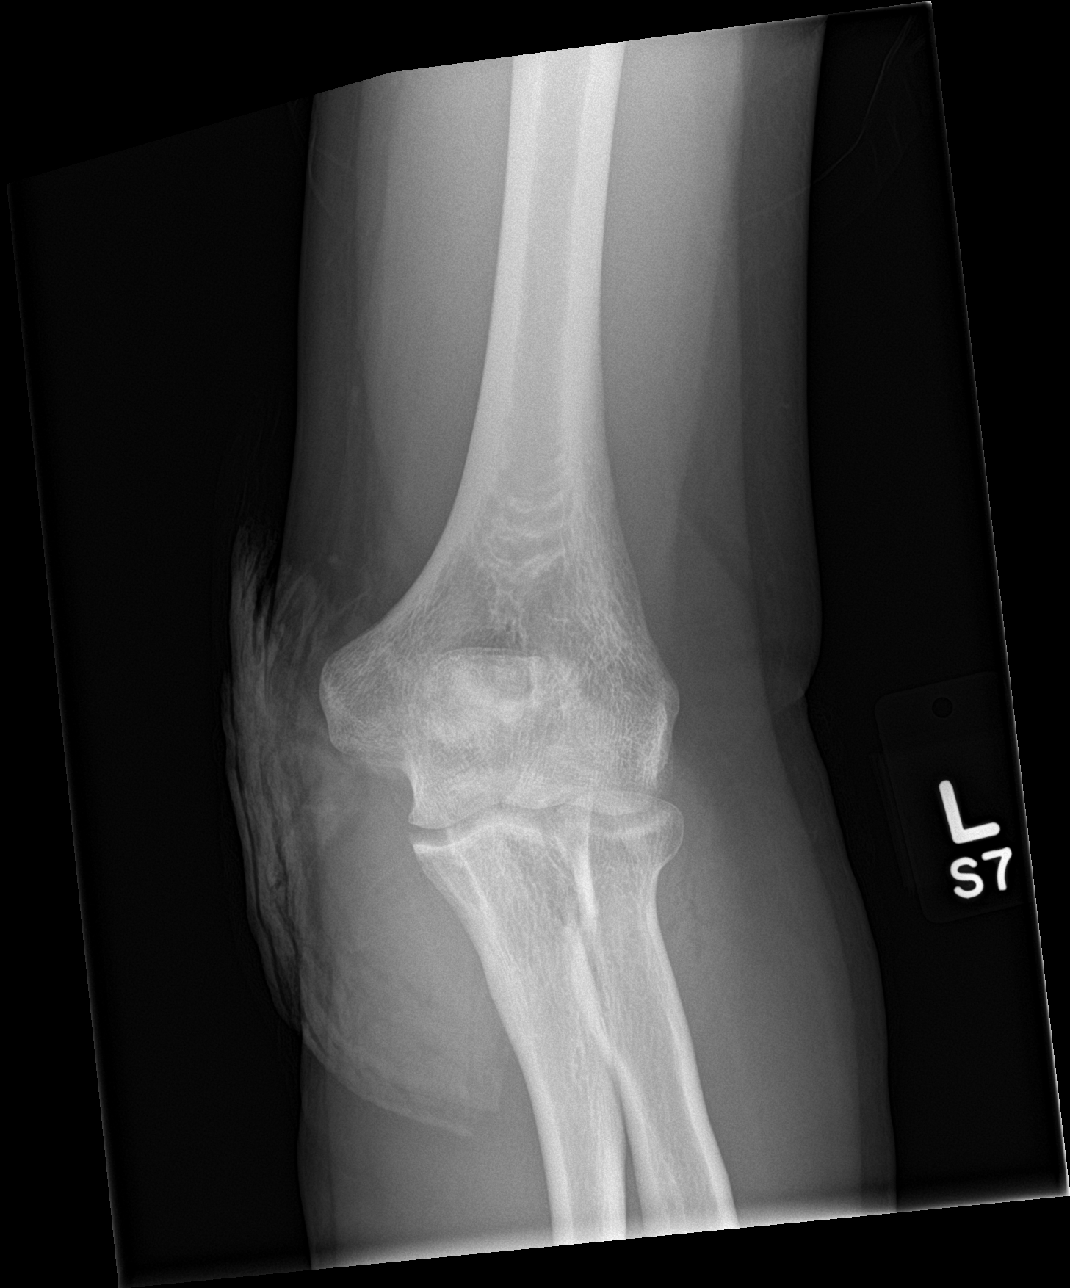

[elbow obl (1 of 2)]
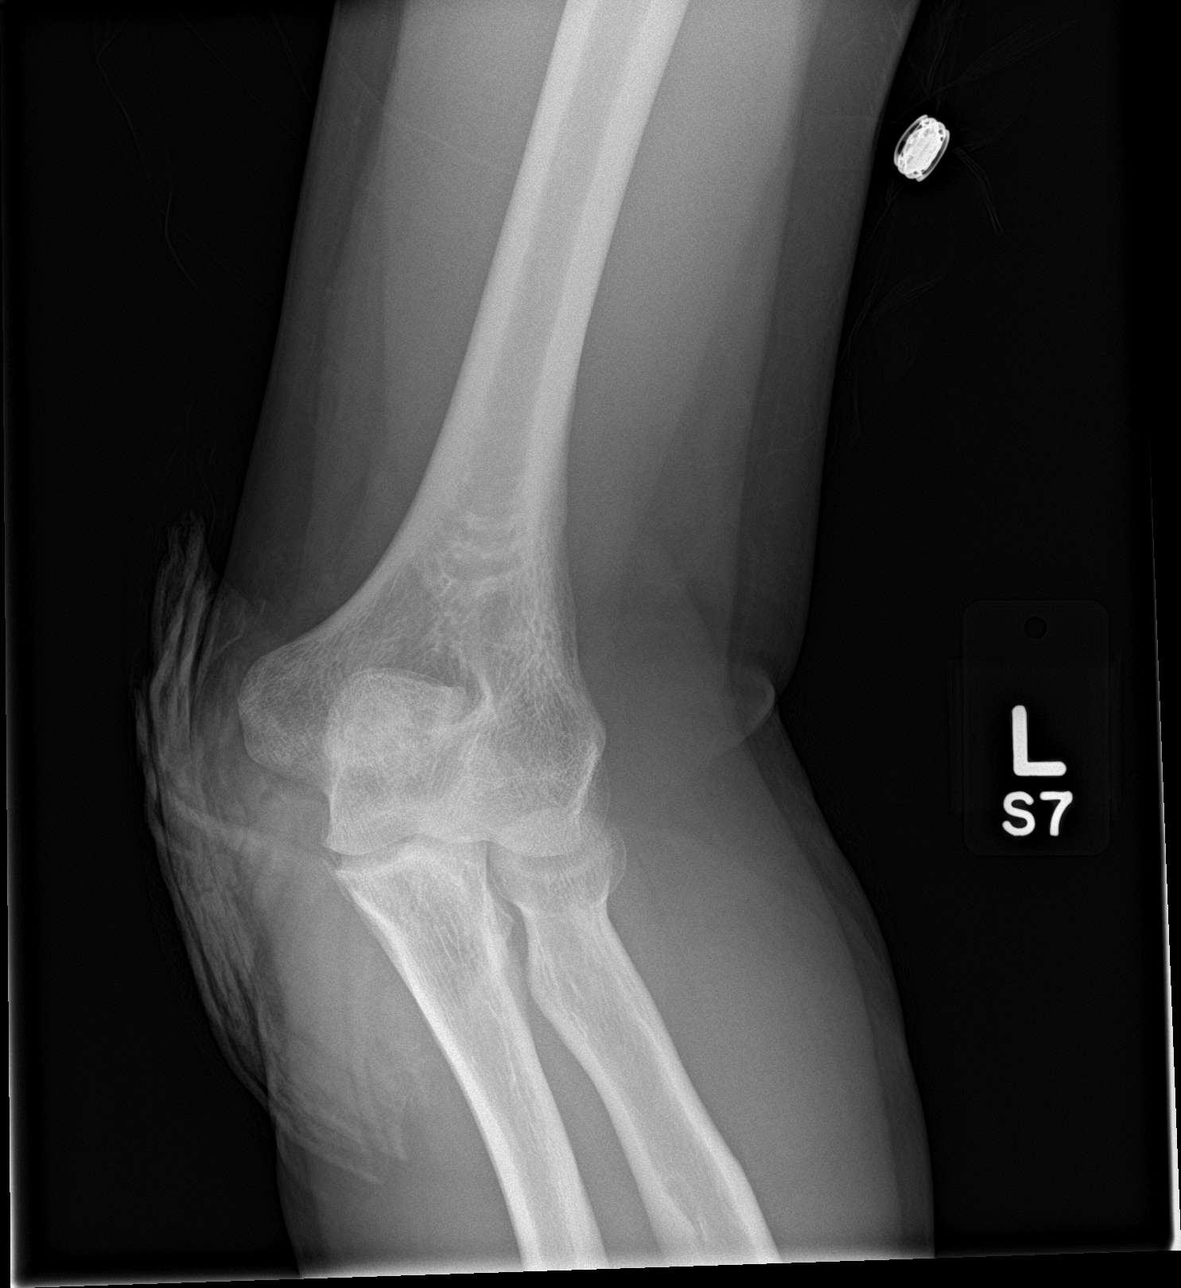

[elbow obl (2 of 2)]
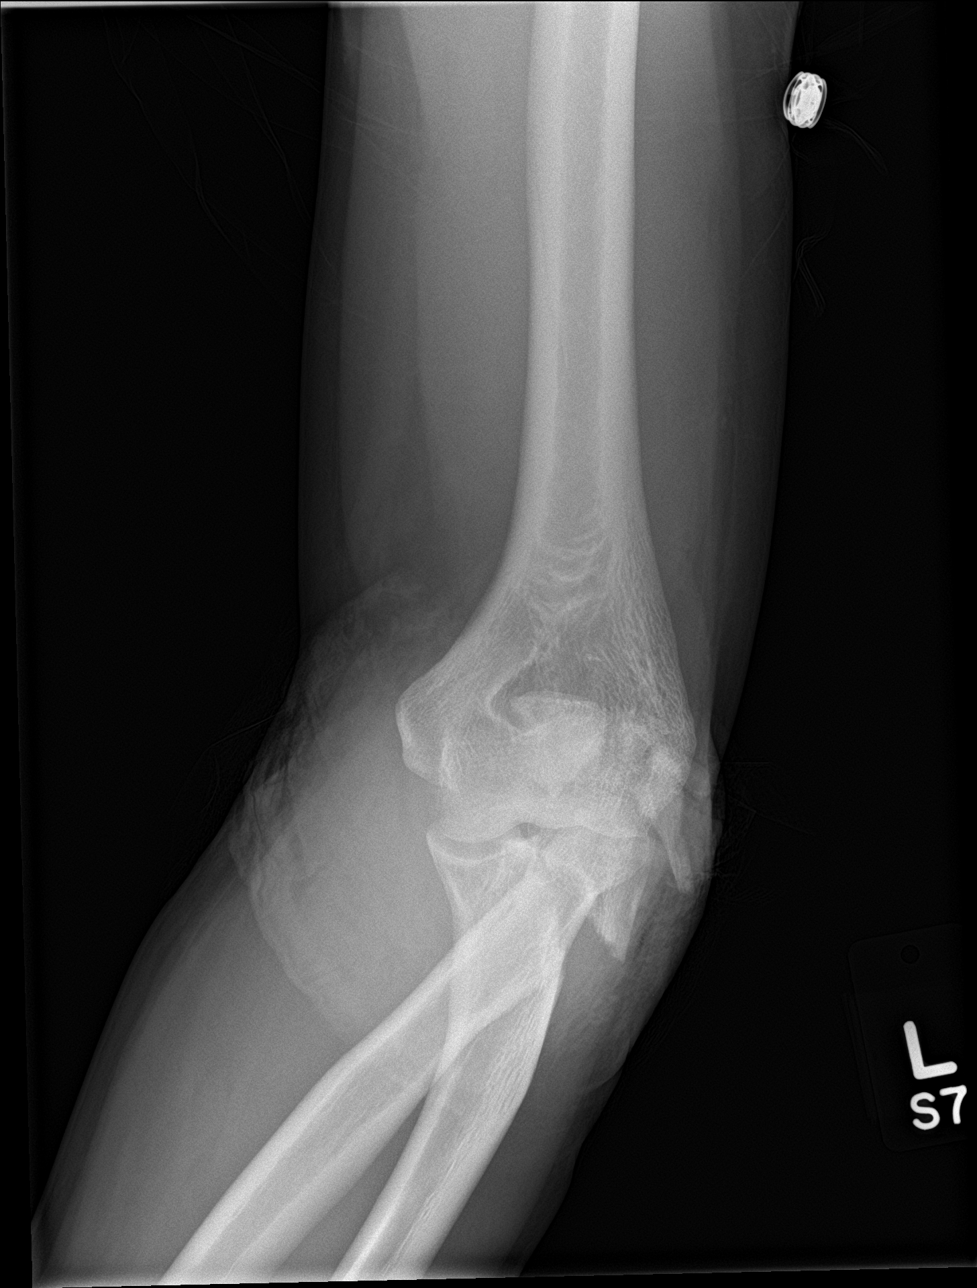

[elbow lat]
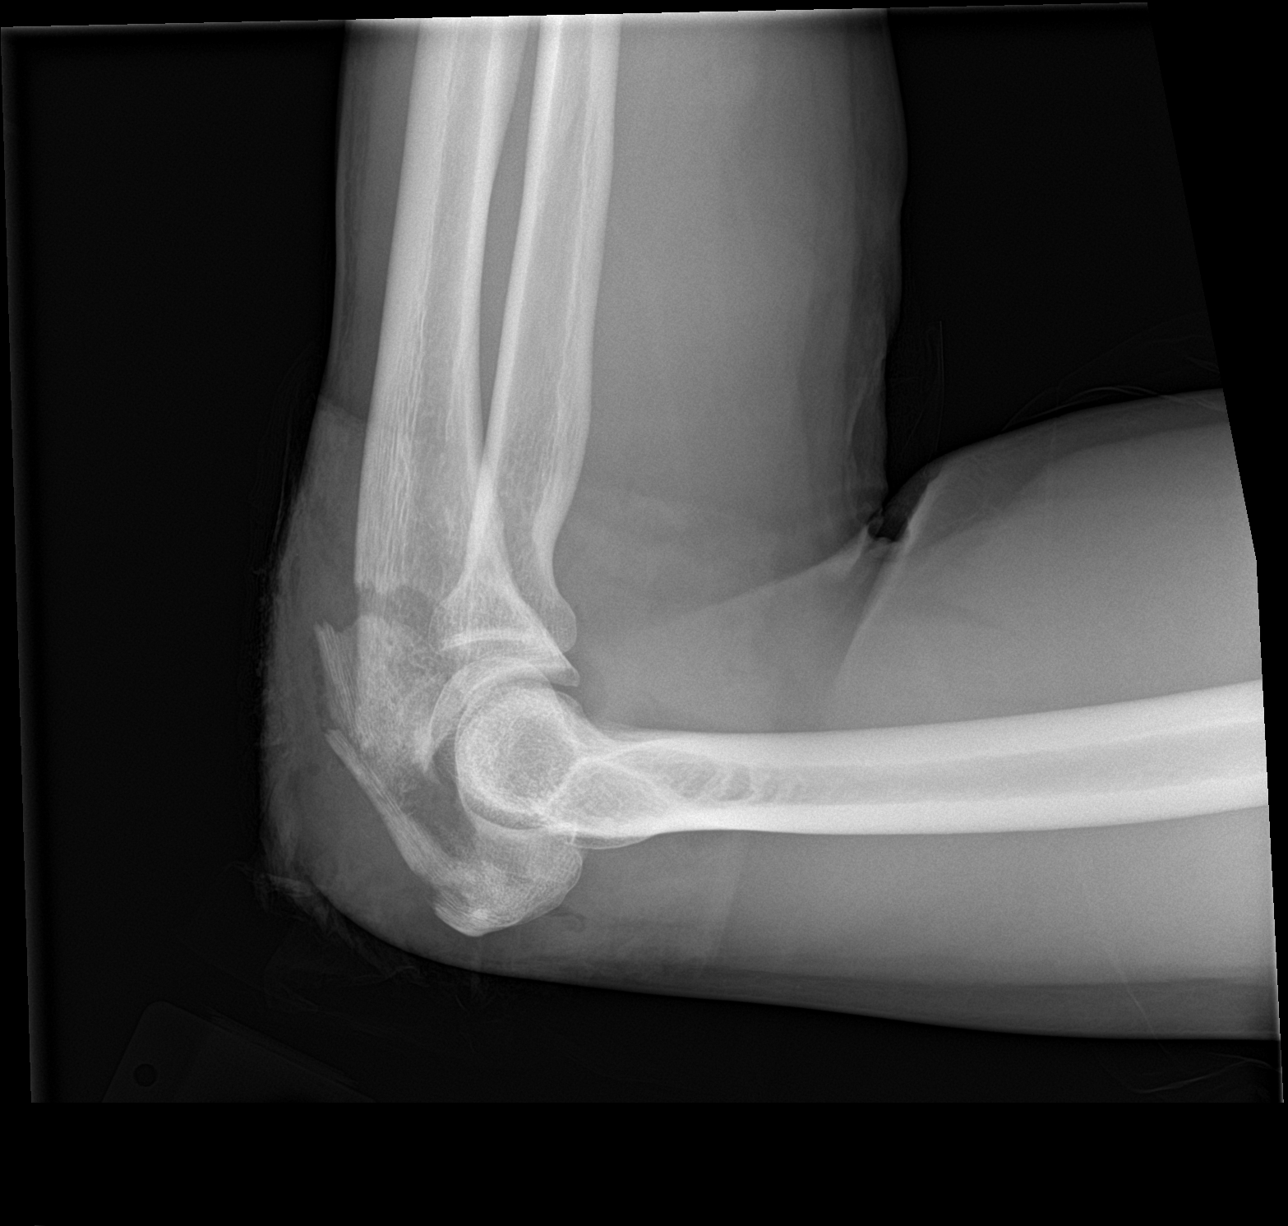

[4 of 4 positions shown; findings below may reference images not displayed]

FINDINGS: Significantly displaced/ comminuted fracture of the olecranon. The
proximal fracture fragment remains grossly well positioned relative
to the distal humerus.

No discrete fracture seen within the distal humerus or proximal
radius. Joint effusion is present.
IMPRESSION: 1. Significantly displaced/comminuted fractures of the olecranon.
Proximal fracture fragment remains grossly well positioned relative
to the distal humerus.
2. No fracture seen within the humerus or proximal radius.
3. Joint effusion.

## 2016-10-01 IMAGING — CR DG ANKLE COMPLETE 3+V*R*
3 series · 3 of 3 positions shown · non-contrast
Comparison: None.

CLINICAL DATA: Fall from 10 feet, right ankle pain. Medial soft
tissue swelling.

EXAM:
RIGHT ANKLE - COMPLETE 3+ VIEW

[ankle ap]
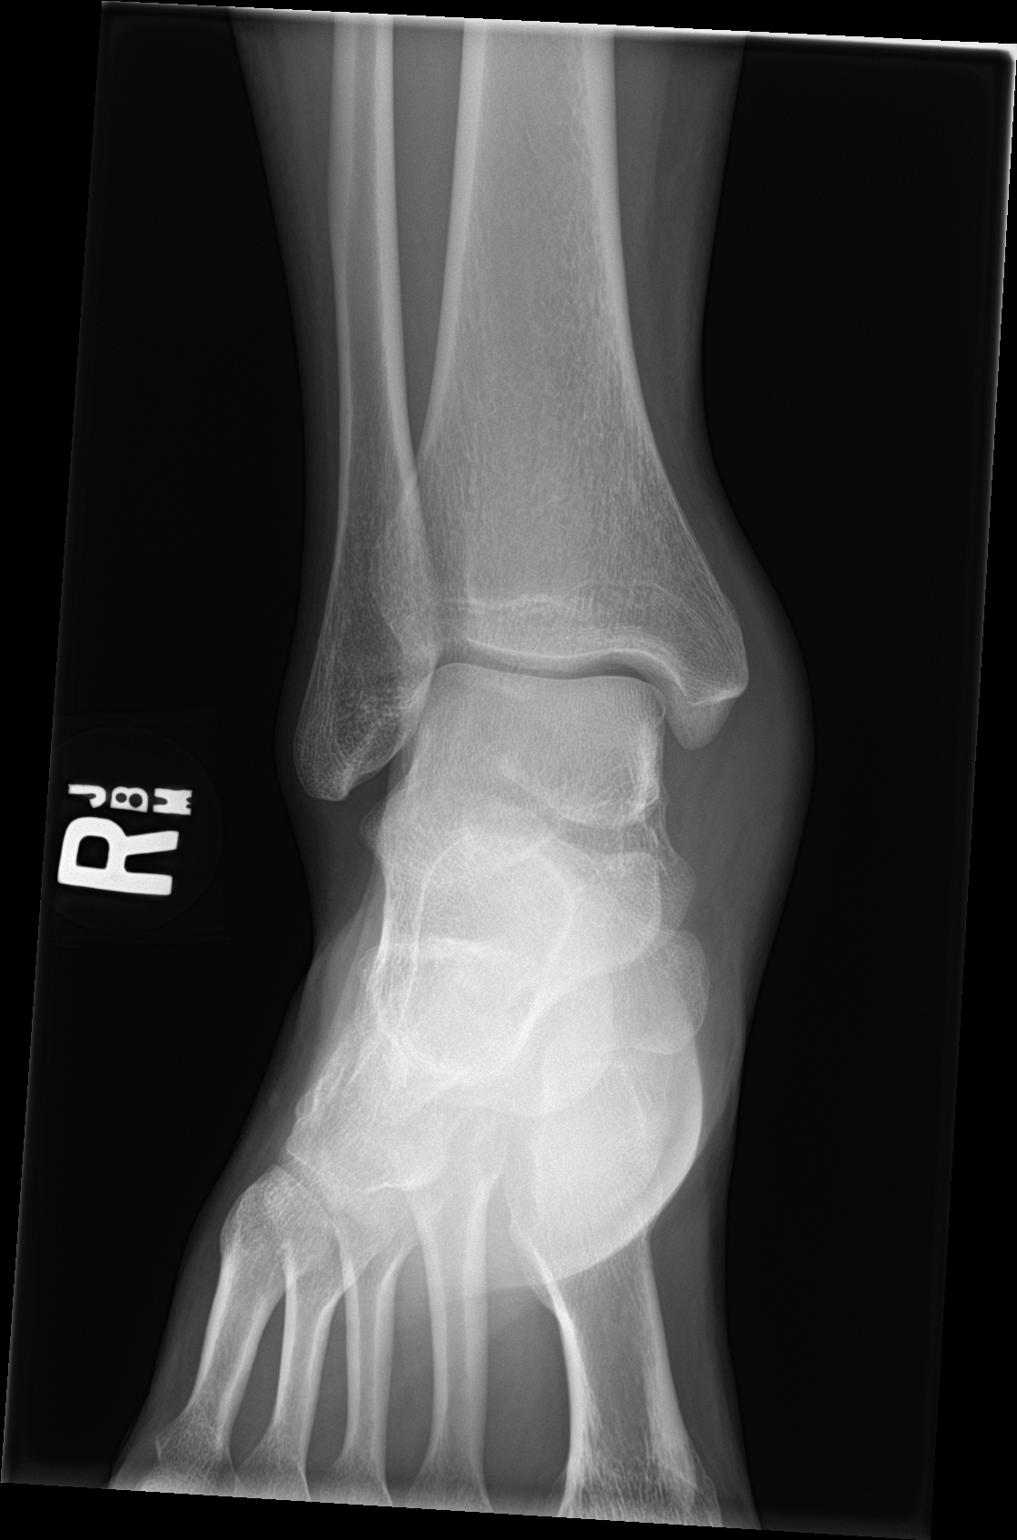

[ankle obl]
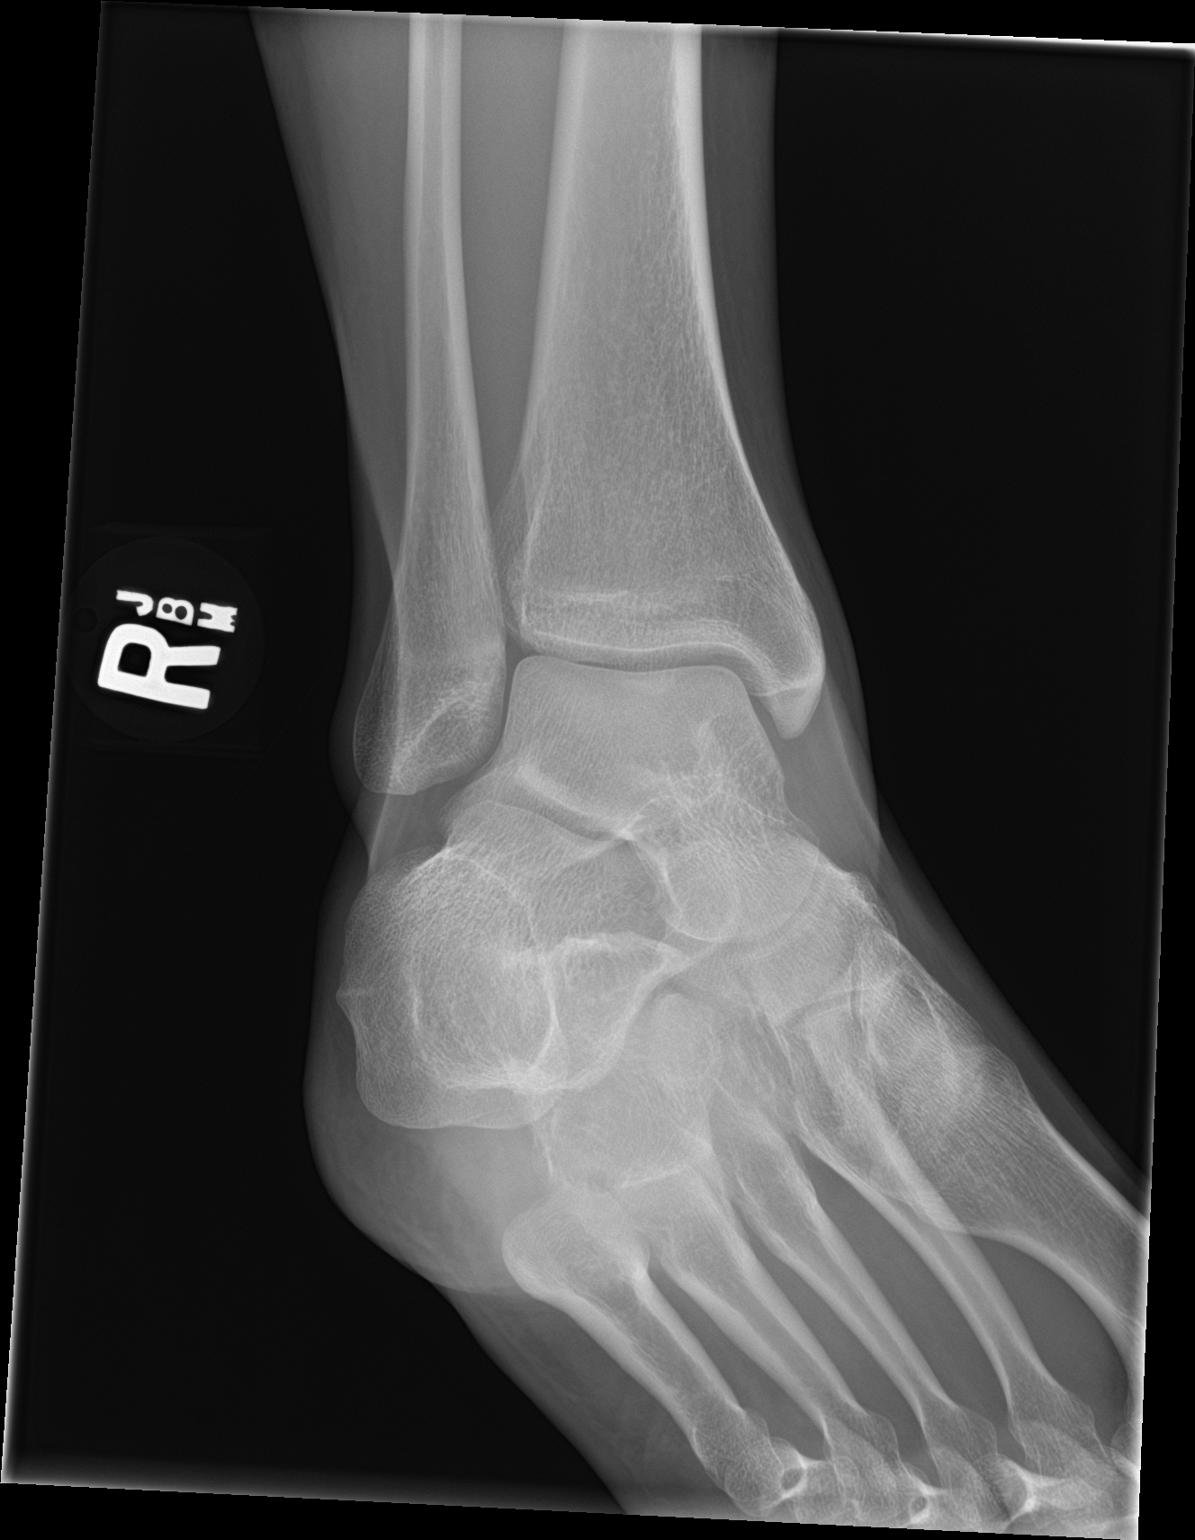

[ankle lat]
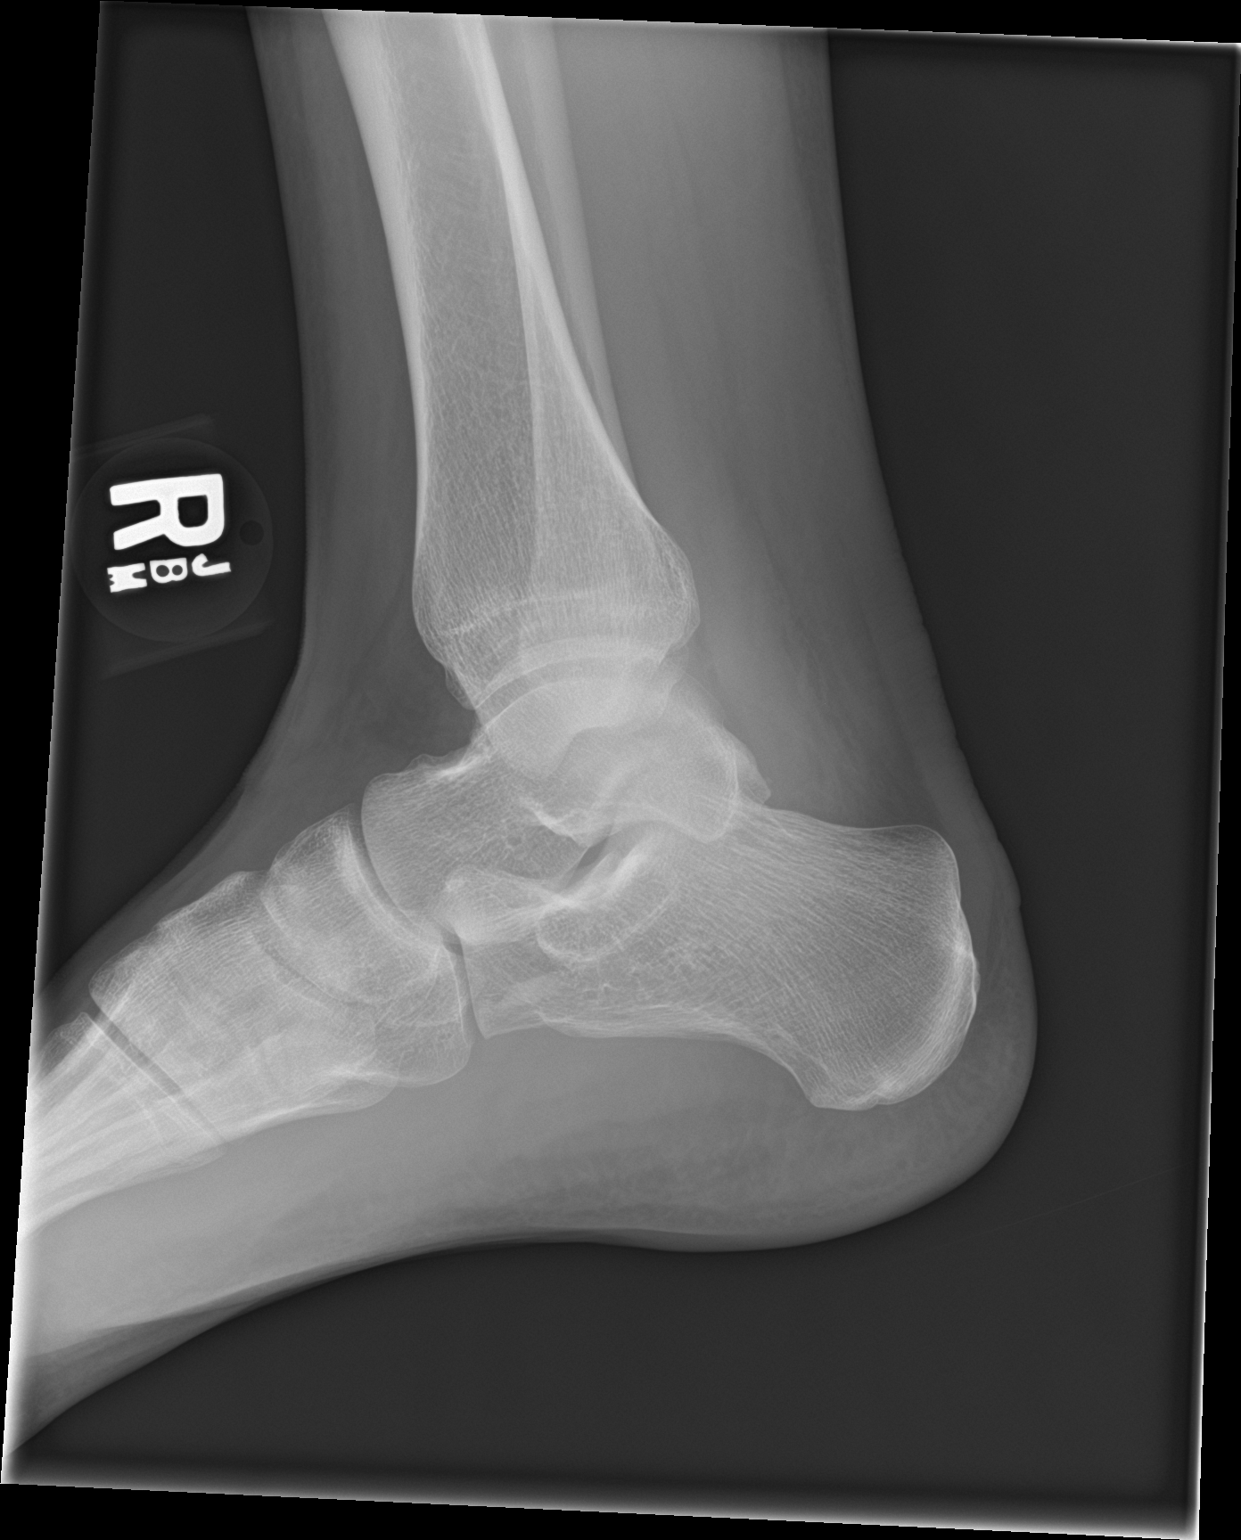

[3 of 3 positions shown; findings below may reference images not displayed]

FINDINGS: Osseous alignment is normal. Bone mineralization is normal. No
fracture line or displaced fracture fragment seen. Ankle mortise is
symmetric. Visualized portions of the hindfoot and midfoot appear
intact and normally aligned.

Prominent soft tissue swelling overlying the medial malleolus.
IMPRESSION: Soft tissue swelling.  No osseous fracture or dislocation seen.

## 2016-10-19 DIAGNOSIS — M9903 Segmental and somatic dysfunction of lumbar region: Secondary | ICD-10-CM | POA: Diagnosis not present

## 2016-10-19 DIAGNOSIS — M62830 Muscle spasm of back: Secondary | ICD-10-CM | POA: Diagnosis not present

## 2016-11-03 ENCOUNTER — Ambulatory Visit: Payer: 59 | Admitting: Family Medicine

## 2016-11-05 ENCOUNTER — Ambulatory Visit (INDEPENDENT_AMBULATORY_CARE_PROVIDER_SITE_OTHER): Payer: 59

## 2016-11-05 ENCOUNTER — Ambulatory Visit (INDEPENDENT_AMBULATORY_CARE_PROVIDER_SITE_OTHER): Payer: 59 | Admitting: Family Medicine

## 2016-11-05 ENCOUNTER — Encounter: Payer: Self-pay | Admitting: Family Medicine

## 2016-11-05 VITALS — BP 131/83 | HR 87 | Temp 98.3°F | Ht 70.0 in | Wt 182.8 lb

## 2016-11-05 DIAGNOSIS — M722 Plantar fascial fibromatosis: Secondary | ICD-10-CM

## 2016-11-05 DIAGNOSIS — M79671 Pain in right foot: Secondary | ICD-10-CM

## 2016-11-05 MED ORDER — METHYLPREDNISOLONE ACETATE 80 MG/ML IJ SUSP: 40.0000 mg | Freq: Once | INTRAMUSCULAR | Status: DC

## 2016-11-05 NOTE — Progress Notes (Signed)
BP 131/83   Pulse 87   Temp 98.3 F (36.8 C) (Oral)   Ht 5\' 10"  (1.778 m)   Wt 182 lb 12.8 oz (82.9 kg)   BMI 26.23 kg/m    Subjective:    Patient ID: Jacob Benson, male    DOB: October 18, 1985, 31 y.o.   MRN: [id number]  HPI: Jacob Benson is a 31 y.o. male presenting on 11/05/2016 for Foot Pain (right. x 3 weeks and not getting any better. Patient states it is worse in the morning when he wakes up and gets better during the day and startes bothering him at night .)   HPI Pain of the bottom of his right heel Patient has been fighting pain on the bottom of his right heel that has been going on for 3 weeks. He has had this previously when he has had plantar fasciitis previously. This time he has been trying to use good inserts and try and ice his feeds and roll his foot overnight water bottle and take anti-inflammatories which have been helping some but the pain is still hurting him significantly and he rates it as an 8 out of 10 when he is trying to ambulate on it. He says that we'll get better in the early morning and then starts bothering him at night and during the evenings. He does have good shoes for work but admits that he walks around barefoot frequently on his laminate at home.  Relevant past medical, surgical, family and social history reviewed and updated as indicated. Interim medical history since our last visit reviewed. Allergies and medications reviewed and updated.  Review of Systems  Constitutional: Negative for chills and fever.  Eyes: Negative for discharge.  Respiratory: Negative for shortness of breath and wheezing.   Cardiovascular: Negative for chest pain and leg swelling.  Musculoskeletal: Positive for arthralgias and myalgias. Negative for back pain and gait problem.  Skin: Negative for rash.  All other systems reviewed and are negative.   Per HPI unless specifically indicated above        Objective:    BP 131/83   Pulse 87   Temp 98.3 F (36.8  C) (Oral)   Ht 5\' 10"  (1.778 m)   Wt 182 lb 12.8 oz (82.9 kg)   BMI 26.23 kg/m   Wt Readings from Last 3 Encounters:  11/05/16 182 lb 12.8 oz (82.9 kg)  08/19/16 191 lb 12.8 oz (87 kg)  05/24/16 192 lb (87.1 kg)    Physical Exam  Constitutional: He is oriented to person, place, and time. He appears well-developed and well-nourished. No distress.  Eyes: Conjunctivae are normal. No scleral icterus.  Cardiovascular: Normal rate, regular rhythm, normal heart sounds and intact distal pulses.   No murmur heard. Pulmonary/Chest: Effort normal and breath sounds normal. No respiratory distress. He has no wheezes. He has no rales.  Musculoskeletal: Normal range of motion. He exhibits no edema.       Right foot: There is tenderness.       Feet:  Neurological: He is alert and oriented to person, place, and time. Coordination normal.  Skin: Skin is warm and dry. No rash noted. He is not diaphoretic.  Psychiatric: He has a normal mood and affect. His behavior is normal.  Nursing note and vitals reviewed.   Right plantar fascia injection: Risk factors of bleeding and infection discussed with patient and patient is agreeable towards injection. Patient prepped with Betadine. Inferior approach towards injection used. Injected 40 mg  of Depo-Medrol and 1 mL of 2% lidocaine. Patient tolerated procedure well and no side effects from noted. Minimal to no bleeding. Simple bandage applied after.     Assessment & Plan:   Problem List Items Addressed This Visit    None    Visit Diagnoses    Plantar fasciitis of right foot    -  Primary   Relevant Medications   methylPREDNISolone acetate (DEPO-MEDROL) injection 40 mg   Other Relevant Orders   DG Foot Complete Right (Completed)       Follow up plan: Return if symptoms worsen or fail to improve.  Counseling provided for all of the vaccine components Orders Placed This Encounter  Procedures  . DG Foot Complete Right    Arville CareJoshua Dettinger,  MD [facility] 11/05/2016, 4:53 PM

## 2016-11-18 MED FILL — AMLODIPINE BESYLATE 5 MG TA: 5 | 90 days supply | Qty: 90 | Fill #0

## 2016-11-18 MED FILL — HYDROCHLOROTHIAZIDE 12.5 MG: 12.5 | 90 days supply | Qty: 90 | Fill #0

## 2016-12-15 ENCOUNTER — Other Ambulatory Visit: Payer: Self-pay | Admitting: *Deleted

## 2016-12-15 MED FILL — MONTELUKAST SOD 10 MG TAB: 10 | 90 days supply | Qty: 90 | Fill #1

## 2016-12-15 MED FILL — ESOMEPRAZOLE MAG DR 40 MG C: 40 | 90 days supply | Qty: 90 | Fill #1

## 2016-12-30 DIAGNOSIS — M9903 Segmental and somatic dysfunction of lumbar region: Secondary | ICD-10-CM | POA: Diagnosis not present

## 2016-12-30 DIAGNOSIS — M62830 Muscle spasm of back: Secondary | ICD-10-CM | POA: Diagnosis not present

## 2017-02-16 ENCOUNTER — Other Ambulatory Visit: Payer: Self-pay

## 2017-02-16 ENCOUNTER — Other Ambulatory Visit: Payer: Self-pay | Admitting: Family Medicine

## 2017-02-16 MED ORDER — AMLODIPINE BESYLATE 5 MG PO TABS: 5.0000 mg | ORAL_TABLET | Freq: Every day | ORAL | 1 refills | Status: DC

## 2017-02-16 MED ORDER — HYDROCHLOROTHIAZIDE 12.5 MG PO CAPS: 12.5000 mg | ORAL_CAPSULE | Freq: Every day | ORAL | 1 refills | Status: DC

## 2017-02-16 MED FILL — AMLODIPINE BESYLATE 5 MG TA: 5 | 90 days supply | Qty: 90 | Fill #0

## 2017-02-18 MED FILL — HYDROCHLOROTHIAZIDE 12.5 MG: 12.5 | 90 days supply | Qty: 90 | Fill #0

## 2017-02-25 ENCOUNTER — Ambulatory Visit (INDEPENDENT_AMBULATORY_CARE_PROVIDER_SITE_OTHER): Payer: 59

## 2017-02-25 DIAGNOSIS — Z23 Encounter for immunization: Secondary | ICD-10-CM | POA: Diagnosis not present

## 2017-03-04 ENCOUNTER — Ambulatory Visit (INDEPENDENT_AMBULATORY_CARE_PROVIDER_SITE_OTHER): Payer: 59 | Admitting: Family Medicine

## 2017-03-04 ENCOUNTER — Encounter: Payer: Self-pay | Admitting: Family Medicine

## 2017-03-04 VITALS — BP 138/82 | HR 69 | Temp 97.1°F | Ht 70.0 in | Wt 185.6 lb

## 2017-03-04 DIAGNOSIS — Z0000 Encounter for general adult medical examination without abnormal findings: Secondary | ICD-10-CM

## 2017-03-04 DIAGNOSIS — J189 Pneumonia, unspecified organism: Secondary | ICD-10-CM

## 2017-03-04 DIAGNOSIS — K219 Gastro-esophageal reflux disease without esophagitis: Secondary | ICD-10-CM

## 2017-03-04 DIAGNOSIS — I10 Essential (primary) hypertension: Secondary | ICD-10-CM

## 2017-03-04 DIAGNOSIS — G479 Sleep disorder, unspecified: Secondary | ICD-10-CM

## 2017-03-04 MED ORDER — AZITHROMYCIN 250 MG PO TABS: ORAL_TABLET | ORAL | 0 refills | Status: DC

## 2017-03-04 MED ORDER — ALBUTEROL SULFATE HFA 108 (90 BASE) MCG/ACT IN AERS: 2.0000 | INHALATION_SPRAY | Freq: Four times a day (QID) | RESPIRATORY_TRACT | 0 refills | Status: DC | PRN

## 2017-03-04 MED ORDER — TRAZODONE HCL 100 MG PO TABS: 100.0000 mg | ORAL_TABLET | Freq: Every evening | ORAL | 3 refills | Status: DC | PRN

## 2017-03-04 NOTE — Progress Notes (Signed)
   HPI  Patient presents today here for annual physical exam also for follow-up hypertension and acute cough.  Cough With sore throat, mild shortness of breath, anterior chest pain, and bilateral ear pain for about 2 weeks. No fever, he is tolerating food and fluids like usual. He does seem to be getting better over the last day or 2 with Mucinex.  GERD Daily symptoms without PPI.  Hypertension Good medication compliance, not checking at home, no headaches or chest pain.  Difficulty sleeping Problem for quite some time, states that they go to bed around 10:00 and he has to get up at 6, takes maybe 4 hours to fall asleep and it seems like as soon as he finally falls asleep he has to get up. He feels tired when he wakes up. He does not snore according to his wife  PMH: Smoking status noted ROS: Per HPI  Objective: BP 138/82   Pulse 69   Temp (!) 97.1 F (36.2 C) (Oral)   Ht _0  (1.778 m)   Wt 185 lb 9.6 oz (84.2 kg)   BMI 26.63 kg/m  Gen: NAD, alert, cooperative with exam HEENT: NCAT, EOMI, PERRL, oropharynx moist with some vesicular changes of the oropharynx, nares clear with mucus, TMs normal bilaterally  CV: RRR, good S1/S2, no murmur Resp: CTABL, no wheezes, non-labored Abd: SNTND, BS present, no guarding or organomegaly Ext: No edema, warm Neuro: Alert and oriented, No gross deficits  Assessment and plan:  #Annual physical exam Normal exam except for what is listed below. Labs today, nonfasting  #Atypical pneumonia 2 weeks of symptoms including shortness of breath and chest pain, azithromycin sent in. Continue supportive care, albuterol also as this has helped in the past   hypertension Well-controlled No changes Labs  #GERD Doing well on PPI, no changes   Difficulty sleeping Trial of trazodone   Orders Placed This Encounter  Procedures  . Lipid panel  . CMP14+EGFR  . CBC with Differential/Platelet  . TSH    Meds ordered this encounter    Medications  . azithromycin (ZITHROMAX) 250 MG tablet    Sig: Take 2 tablets on day 1 and 1 tablet daily after that    Dispense:  6 tablet    Refill:  0  . albuterol (PROVENTIL HFA;VENTOLIN HFA) 108 (90 Base) MCG/ACT inhaler    Sig: Inhale 2 puffs into the lungs every 6 (six) hours as needed for wheezing or shortness of breath.    Dispense:  1 Inhaler    Refill:  0  . traZODone (DESYREL) 100 MG tablet    Sig: Take 1 tablet (100 mg total) by mouth at bedtime as needed for sleep.    Dispense:  30 tablet    Refill:  [place], MD [place] 03/04/2017, 8:51 AM

## 2017-03-04 NOTE — Patient Instructions (Signed)
Great to see you!  Come back in 6 months unless you need us sooner.   We started trazodone for difficulty sleeping.   For your illness I have sent in a Z pack, keep up the mucinex while you need it.    Health Maintenance, Male A healthy lifestyle and preventive care is important for your health and wellness. Ask your health care provider about what schedule of regular examinations is right for you. What should I know about weight and diet? Eat a Healthy Diet  Eat plenty of vegetables, fruits, whole grains, low-fat dairy products, and lean protein.  Do not eat a lot of foods high in solid fats, added sugars, or salt.  Maintain a Healthy Weight Regular exercise can help you achieve or maintain a healthy weight. You should:  Do at least 150 minutes of exercise each week. The exercise should increase your heart rate and make you sweat (moderate-intensity exercise).  Do strength-training exercises at least twice a week.  Watch Your Levels of Cholesterol and Blood Lipids  Have your blood tested for lipids and cholesterol every 5 years starting at 30 years of age. If you are at high risk for heart disease, you should start having your blood tested when you are 30 years old. You may need to have your cholesterol levels checked more often if: ? Your lipid or cholesterol levels are high. ? You are older than 30 years of age. ? You are at high risk for heart disease.  What should I know about cancer screening? Many types of cancers can be detected early and may often be prevented. Lung Cancer  You should be screened every year for lung cancer if: ? You are a current smoker who has smoked for at least 30 years. ? You are a former smoker who has quit within the past 15 years.  Talk to your health care provider about your screening options, when you should start screening, and how often you should be screened.  Colorectal Cancer  Routine colorectal cancer screening usually begins at 30  years of age and should be repeated every 5-10 years until you are 30 years old. You may need to be screened more often if early forms of precancerous polyps or small growths are found. Your health care provider may recommend screening at an earlier age if you have risk factors for colon cancer.  Your health care provider may recommend using home test kits to check for hidden blood in the stool.  A small camera at the end of a tube can be used to examine your colon (sigmoidoscopy or colonoscopy). This checks for the earliest forms of colorectal cancer.  Prostate and Testicular Cancer  Depending on your age and overall health, your health care provider may do certain tests to screen for prostate and testicular cancer.  Talk to your health care provider about any symptoms or concerns you have about testicular or prostate cancer.  Skin Cancer  Check your skin from head to toe regularly.  Tell your health care provider about any new moles or changes in moles, especially if: ? There is a change in a mole's size, shape, or color. ? You have a mole that is larger than a pencil eraser.  Always use sunscreen. Apply sunscreen liberally and repeat throughout the day.  Protect yourself by wearing long sleeves, pants, a wide-brimmed hat, and sunglasses when outside.  What should I know about heart disease, diabetes, and high blood pressure?  If you are 30-39  years of age, have your blood pressure checked every 3-5 years. If you are 30 years of age or older, have your blood pressure checked every year. You should have your blood pressure measured twice-once when you are at a hospital or clinic, and once when you are not at a hospital or clinic. Record the average of the two measurements. To check your blood pressure when you are not at a hospital or clinic, you can use: ? An automated blood pressure machine at a pharmacy. ? A home blood pressure monitor.  Talk to your health care provider about your  target blood pressure.  If you are between 30-23 years old, ask your health care provider if you should take aspirin to prevent heart disease.  Have regular diabetes screenings by checking your fasting blood sugar level. ? If you are at a normal weight and have a low risk for diabetes, have this test once every three years after the age of 30. ? If you are overweight and have a high risk for diabetes, consider being tested at a younger age or more often.  A one-time screening for abdominal aortic aneurysm (AAA) by ultrasound is recommended for men aged 63-75 years who are current or former smokers. What should I know about preventing infection? Hepatitis B If you have a higher risk for hepatitis B, you should be screened for this virus. Talk with your health care provider to find out if you are at risk for hepatitis B infection. Hepatitis C Blood testing is recommended for:  Everyone born from [birth year] through 1964.  Anyone with known risk factors for hepatitis C.  Sexually Transmitted Diseases (STDs)  You should be screened each year for STDs including gonorrhea and chlamydia if: ? You are sexually active and are younger than 30 years of age. ? You are older than 30 years of age and your health care provider tells you that you are at risk for this type of infection. ? Your sexual activity has changed since you were last screened and you are at an increased risk for chlamydia or gonorrhea. Ask your health care provider if you are at risk.  Talk with your health care provider about whether you are at high risk of being infected with HIV. Your health care provider may recommend a prescription medicine to help prevent HIV infection.  What else can I do?  Schedule regular health, dental, and eye exams.  Stay current with your vaccines (immunizations).  Do not use any tobacco products, such as cigarettes, chewing tobacco, and e-cigarettes. If you need help quitting, ask your health care  provider.  Limit alcohol intake to no more than 2 drinks per day. One drink equals 12 ounces of beer, 5 ounces of wine, or 1 ounces of hard liquor.  Do not use street drugs.  Do not share needles.  Ask your health care provider for help if you need support or information about quitting drugs.  Tell your health care provider if you often feel depressed.  Tell your health care provider if you have ever been abused or do not feel safe at home. This information is not intended to replace advice given to you by your health care provider. Make sure you discuss any questions you have with your health care provider. Document Released: 10/30/2007 Document Revised: 12/31/2015 Document Reviewed: 02/04/2015 Elsevier Interactive Patient Education  [employer].

## 2017-03-05 LAB — CMP14+EGFR
ALT: 23 IU/L (ref 0–44)
AST: 22 IU/L (ref 0–40)
Albumin/Globulin Ratio: 1.9 (ref 1.2–2.2)
Albumin: 4.8 g/dL (ref 3.5–5.5)
Alkaline Phosphatase: 103 IU/L (ref 39–117)
BUN/Creatinine Ratio: 15 (ref 9–20)
BUN: 20 mg/dL (ref 6–20)
Bilirubin Total: 0.7 mg/dL (ref 0.0–1.2)
CALCIUM: 10.3 mg/dL — AB (ref 8.7–10.2)
CO2: 28 mmol/L (ref 20–29)
CREATININE: 1.36 mg/dL — AB (ref 0.76–1.27)
Chloride: 98 mmol/L (ref 96–106)
GFR, EST AFRICAN AMERICAN: 80 mL/min/{1.73_m2} (ref >59)
GFR, EST NON AFRICAN AMERICAN: 69 mL/min/{1.73_m2} (ref >59)
Globulin, Total: 2.5 g/dL (ref 1.5–4.5)
Glucose: 82 mg/dL (ref 65–99)
POTASSIUM: 5.2 mmol/L (ref 3.5–5.2)
Sodium: 142 mmol/L (ref 134–144)
TOTAL PROTEIN: 7.3 g/dL (ref 6.0–8.5)

## 2017-03-05 LAB — LIPID PANEL
CHOL/HDL RATIO: 5.3 ratio — AB (ref 0.0–5.0)
Cholesterol, Total: 232 mg/dL — ABNORMAL HIGH (ref 100–199)
HDL: 44 mg/dL (ref >39)
LDL Calculated: 170 mg/dL — ABNORMAL HIGH (ref 0–99)
Triglycerides: 90 mg/dL (ref 0–149)
VLDL Cholesterol Cal: 18 mg/dL (ref 5–40)

## 2017-03-05 LAB — CBC WITH DIFFERENTIAL/PLATELET
BASOS ABS: 0 10*3/uL (ref 0.0–0.2)
Basos: 0 %
EOS (ABSOLUTE): 0.1 10*3/uL (ref 0.0–0.4)
Eos: 1 %
HEMATOCRIT: 45.4 % (ref 37.5–51.0)
HEMOGLOBIN: 15.8 g/dL (ref 13.0–17.7)
IMMATURE GRANS (ABS): 0 10*3/uL (ref 0.0–0.1)
Immature Granulocytes: 0 %
LYMPHS ABS: 1.4 10*3/uL (ref 0.7–3.1)
LYMPHS: 17 %
MCH: 30.1 pg (ref 26.6–33.0)
MCHC: 34.8 g/dL (ref 31.5–35.7)
MCV: 87 fL (ref 79–97)
MONOCYTES: 10 %
Monocytes Absolute: 0.8 10*3/uL (ref 0.1–0.9)
NEUTROS ABS: 6 10*3/uL (ref 1.4–7.0)
Neutrophils: 72 %
Platelets: 277 10*3/uL (ref 150–379)
RBC: 5.25 x10E6/uL (ref 4.14–5.80)
RDW: 14.5 % (ref 12.3–15.4)
WBC: 8.4 10*3/uL (ref 3.4–10.8)

## 2017-03-05 LAB — TSH: TSH: 1.8 u[IU]/mL (ref 0.450–4.500)

## 2017-03-07 ENCOUNTER — Telehealth: Payer: Self-pay | Admitting: Family Medicine

## 2017-03-07 MED ORDER — DOXEPIN HCL 6 MG PO TABS: 6.0000 mg | ORAL_TABLET | Freq: Every day | ORAL | 1 refills | Status: DC

## 2017-03-07 NOTE — Telephone Encounter (Signed)
Called and discussed the labs.  Elevated LDL, no statins for now.  Lifestyle recommendations made.  Creatinine slightly elevated, however GFR okay.  Discussed NSAID use.  Murtis SinkSam Bradshaw, MD [facility] 03/07/2017, 5:27 PM

## 2017-03-10 ENCOUNTER — Other Ambulatory Visit: Payer: Self-pay | Admitting: Family Medicine

## 2017-03-10 MED ORDER — DOXEPIN HCL 10 MG PO CAPS: 10.0000 mg | ORAL_CAPSULE | Freq: Every day | ORAL | 3 refills | Status: DC

## 2017-03-21 ENCOUNTER — Other Ambulatory Visit: Payer: Self-pay | Admitting: Family Medicine

## 2017-03-21 ENCOUNTER — Other Ambulatory Visit: Payer: Self-pay

## 2017-03-21 MED ORDER — ESOMEPRAZOLE MAGNESIUM 40 MG PO CPDR: 40.0000 mg | DELAYED_RELEASE_CAPSULE | Freq: Every day | ORAL | 1 refills | Status: DC

## 2017-03-21 MED ORDER — MONTELUKAST SODIUM 10 MG PO TABS: 10.0000 mg | ORAL_TABLET | Freq: Every day | ORAL | 1 refills | Status: DC

## 2017-03-21 MED FILL — ESOMEPRAZOLE MAG DR 40 MG C: 40 | 90 days supply | Qty: 90 | Fill #0

## 2017-03-21 MED FILL — MONTELUKAST SOD 10 MG TAB: 10 | 90 days supply | Qty: 90 | Fill #0

## 2017-03-29 DIAGNOSIS — M62830 Muscle spasm of back: Secondary | ICD-10-CM | POA: Diagnosis not present

## 2017-03-29 DIAGNOSIS — M9903 Segmental and somatic dysfunction of lumbar region: Secondary | ICD-10-CM | POA: Diagnosis not present

## 2017-05-12 ENCOUNTER — Other Ambulatory Visit: Payer: Self-pay

## 2017-05-12 MED ORDER — AZITHROMYCIN 250 MG PO TABS: ORAL_TABLET | ORAL | 0 refills | Status: DC

## 2017-05-18 MED FILL — HYDROCHLOROTHIAZIDE 12.5 MG: 12.5 | 90 days supply | Qty: 90 | Fill #1

## 2017-05-18 MED FILL — AMLODIPINE BESYLATE 5 MG TA: 5 | 90 days supply | Qty: 90 | Fill #1

## 2017-05-18 MED FILL — SHIPPING COST: 1 days supply | Qty: 1 | Fill #0

## 2017-06-13 ENCOUNTER — Other Ambulatory Visit: Payer: Self-pay | Admitting: Pediatrics

## 2017-06-13 DIAGNOSIS — Z20828 Contact with and (suspected) exposure to other viral communicable diseases: Secondary | ICD-10-CM

## 2017-06-13 MED ORDER — OSELTAMIVIR PHOSPHATE 75 MG PO CAPS: 75.0000 mg | ORAL_CAPSULE | Freq: Every day | ORAL | 0 refills | Status: AC

## 2017-06-15 MED FILL — MONTELUKAST SOD 10 MG TAB: 10 | 90 days supply | Qty: 90 | Fill #1

## 2017-06-15 MED FILL — SHIPPING COST: 1 days supply | Qty: 1 | Fill #1

## 2017-06-15 MED FILL — ESOMEPRAZOLE MAG DR 40 MG C: 40 | 90 days supply | Qty: 90 | Fill #0

## 2017-06-21 ENCOUNTER — Other Ambulatory Visit: Payer: Self-pay

## 2017-06-21 MED ORDER — AZITHROMYCIN 250 MG PO TABS: ORAL_TABLET | ORAL | 0 refills | Status: DC

## 2017-06-24 ENCOUNTER — Other Ambulatory Visit: Payer: Self-pay

## 2017-06-24 MED ORDER — AMOXICILLIN-POT CLAVULANATE 875-125 MG PO TABS: 1.0000 | ORAL_TABLET | Freq: Two times a day (BID) | ORAL | 0 refills | Status: DC

## 2017-08-22 ENCOUNTER — Other Ambulatory Visit: Payer: Self-pay | Admitting: Family Medicine

## 2017-08-22 ENCOUNTER — Other Ambulatory Visit: Payer: Self-pay

## 2017-08-22 MED ORDER — AMLODIPINE BESYLATE 5 MG PO TABS: 5.0000 mg | ORAL_TABLET | Freq: Every day | ORAL | 0 refills | Status: DC

## 2017-08-22 MED FILL — SHIPPING COST: 1 days supply | Qty: 1 | Fill #2

## 2017-08-22 MED FILL — AMLODIPINE BESYLATE 5 MG TA: 5 | 90 days supply | Qty: 90 | Fill #0

## 2017-08-22 MED FILL — HYDROCHLOROTHIAZIDE 12.5 MG: 12.5 | 90 days supply | Qty: 90 | Fill #0

## 2017-09-15 ENCOUNTER — Other Ambulatory Visit: Payer: Self-pay

## 2017-09-15 MED ORDER — MONTELUKAST SODIUM 10 MG PO TABS: 10.0000 mg | ORAL_TABLET | Freq: Every day | ORAL | 1 refills | Status: DC

## 2017-09-15 MED ORDER — ESOMEPRAZOLE MAGNESIUM 40 MG PO CPDR: 40.0000 mg | DELAYED_RELEASE_CAPSULE | Freq: Every day | ORAL | 1 refills | Status: DC

## 2017-09-21 MED FILL — SHIPPING COST: 1 days supply | Qty: 1 | Fill #3

## 2017-09-21 MED FILL — MONTELUKAST SOD 10 MG TAB: 10 | 90 days supply | Qty: 90 | Fill #0

## 2017-09-29 MED FILL — SHIPPING COST: 1 days supply | Qty: 1 | Fill #4

## 2017-09-29 MED FILL — ESOMEPRAZOLE MAG DR 40 MG C: 40 | 90 days supply | Qty: 90 | Fill #1

## 2017-11-04 ENCOUNTER — Encounter: Payer: Self-pay | Admitting: Family Medicine

## 2017-11-04 ENCOUNTER — Ambulatory Visit (INDEPENDENT_AMBULATORY_CARE_PROVIDER_SITE_OTHER): Payer: No Typology Code available for payment source | Admitting: Family Medicine

## 2017-11-04 VITALS — BP 138/83 | HR 77 | Temp 97.0°F | Ht 70.0 in | Wt 183.0 lb

## 2017-11-04 DIAGNOSIS — Z0000 Encounter for general adult medical examination without abnormal findings: Secondary | ICD-10-CM

## 2017-11-04 DIAGNOSIS — K219 Gastro-esophageal reflux disease without esophagitis: Secondary | ICD-10-CM

## 2017-11-04 DIAGNOSIS — I10 Essential (primary) hypertension: Secondary | ICD-10-CM

## 2017-11-04 MED ORDER — TRAZODONE HCL 50 MG PO TABS: 25.0000 mg | ORAL_TABLET | Freq: Every evening | ORAL | 3 refills | Status: DC | PRN

## 2017-11-04 NOTE — Progress Notes (Signed)
BP 138/83   Pulse 77   Temp (!) 97 F (36.1 C) (Oral)   Ht 5\' 10"  (1.778 m)   Wt 183 lb (83 kg)   BMI 26.26 kg/m    Subjective:    Patient ID: Jacob FasterBrandon M Sperling, male    DOB: 09/08/1985, 32 y.o.   MRN: [id number]  HPI: Jacob Benson is a 32 y.o. male presenting on 11/04/2017 for Annual Exam   HPI Well adult exam Patient comes in for well adult exam and physical.  He is also coming in for recheck of hypertension and GERD.  He says both of those are doing very well and his blood pressure has been running very well form.  He has blood pressure today in the office is 138/83 but he says it runs a lot better at home. Patient denies any chest pain, shortness of breath, headaches or vision issues, abdominal complaints, diarrhea, nausea, vomiting, or joint issues.   Relevant past medical, surgical, family and social history reviewed and updated as indicated. Interim medical history since our last visit reviewed. Allergies and medications reviewed and updated.  Review of Systems  Constitutional: Negative for chills and fever.  HENT: Negative for ear pain and tinnitus.   Eyes: Negative for pain and discharge.  Respiratory: Negative for cough, shortness of breath and wheezing.   Cardiovascular: Negative for chest pain, palpitations and leg swelling.  Gastrointestinal: Negative for abdominal pain, blood in stool, constipation and diarrhea.  Genitourinary: Negative for dysuria and hematuria.  Musculoskeletal: Negative for back pain, gait problem and myalgias.  Skin: Negative for rash.  Neurological: Negative for dizziness, weakness and headaches.  Psychiatric/Behavioral: Negative for suicidal ideas.  All other systems reviewed and are negative.   Per HPI unless specifically indicated above   Allergies as of 11/04/2017      Reactions   Guaifenesin & Derivatives Palpitations   Oxycodone Nausea And Vomiting      Medication List        Accurate as of 11/04/17  3:51 PM. Always use  your most recent med list.          albuterol 108 (90 Base) MCG/ACT inhaler Commonly known as:  PROVENTIL HFA;VENTOLIN HFA Inhale 2 puffs into the lungs every 6 (six) hours as needed for wheezing or shortness of breath.   amLODipine 5 MG tablet Commonly known as:  NORVASC TAKE 1 TABLET (5 MG TOTAL) BY MOUTH DAILY.   esomeprazole 40 MG capsule Commonly known as:  NEXIUM Take 1 capsule (40 mg total) by mouth daily.   hydrochlorothiazide 12.5 MG capsule Commonly known as:  MICROZIDE TAKE 1 CAPSULE BY MOUTH DAILY.   montelukast 10 MG tablet Commonly known as:  SINGULAIR Take 1 tablet (10 mg total) by mouth at bedtime.   traZODone 50 MG tablet Commonly known as:  DESYREL Take 0.5-1 tablets (25-50 mg total) by mouth at bedtime as needed for sleep.          Objective:    BP 138/83   Pulse 77   Temp (!) 97 F (36.1 C) (Oral)   Ht 5\' 10"  (1.778 m)   Wt 183 lb (83 kg)   BMI 26.26 kg/m   Wt Readings from Last 3 Encounters:  11/04/17 183 lb (83 kg)  03/04/17 185 lb 9.6 oz (84.2 kg)  11/05/16 182 lb 12.8 oz (82.9 kg)    Physical Exam  Constitutional: He is oriented to person, place, and time. He appears well-developed and well-nourished. No distress.  HENT:  Right Ear: External ear normal.  Left Ear: External ear normal.  Nose: Nose normal.  Mouth/Throat: Oropharynx is clear and moist. No oropharyngeal exudate.  Eyes: Pupils are equal, round, and reactive to light. Conjunctivae and EOM are normal. Right eye exhibits no discharge. No scleral icterus.  Neck: Neck supple. No thyromegaly present.  Cardiovascular: Normal rate, regular rhythm, normal heart sounds and intact distal pulses.  No murmur heard. Pulmonary/Chest: Effort normal and breath sounds normal. No respiratory distress. He has no wheezes.  Abdominal: Soft. Bowel sounds are normal. He exhibits no distension. There is no tenderness. There is no rebound and no guarding.  Musculoskeletal: Normal range of motion.  He exhibits no edema.  Lymphadenopathy:    He has no cervical adenopathy.  Neurological: He is alert and oriented to person, place, and time. Coordination normal.  Skin: Skin is warm and dry. No rash noted. He is not diaphoretic.  Psychiatric: He has a normal mood and affect. His behavior is normal.  Vitals reviewed.       Assessment & Plan:   Problem List Items Addressed This Visit      Cardiovascular and Mediastinum   HTN (hypertension)     Digestive   GERD (gastroesophageal reflux disease)    Other Visit Diagnoses    Well adult exam    -  Primary       Follow up plan: Return in about 1 year (around 11/05/2018), or if symptoms worsen or fail to improve.  Counseling provided for all of the vaccine components No orders of the defined types were placed in this encounter.   Arville Care, MD [facility] 11/04/2017, 3:51 PM

## 2017-11-11 ENCOUNTER — Other Ambulatory Visit: Payer: No Typology Code available for payment source

## 2017-11-11 DIAGNOSIS — Z0000 Encounter for general adult medical examination without abnormal findings: Secondary | ICD-10-CM

## 2017-11-12 LAB — CMP14+EGFR
A/G RATIO: 2.1 (ref 1.2–2.2)
ALBUMIN: 4.5 g/dL (ref 3.5–5.5)
ALT: 19 IU/L (ref 0–44)
AST: 16 IU/L (ref 0–40)
Alkaline Phosphatase: 66 IU/L (ref 39–117)
BILIRUBIN TOTAL: 0.3 mg/dL (ref 0.0–1.2)
BUN / CREAT RATIO: 18 (ref 9–20)
BUN: 22 mg/dL — AB (ref 6–20)
CO2: 28 mmol/L (ref 20–29)
Calcium: 9.2 mg/dL (ref 8.7–10.2)
Chloride: 100 mmol/L (ref 96–106)
Creatinine, Ser: 1.21 mg/dL (ref 0.76–1.27)
GFR calc non Af Amer: 79 mL/min/{1.73_m2} (ref >59)
GFR, EST AFRICAN AMERICAN: 92 mL/min/{1.73_m2} (ref >59)
Globulin, Total: 2.1 g/dL (ref 1.5–4.5)
Glucose: 78 mg/dL (ref 65–99)
POTASSIUM: 4.1 mmol/L (ref 3.5–5.2)
SODIUM: 141 mmol/L (ref 134–144)
TOTAL PROTEIN: 6.6 g/dL (ref 6.0–8.5)

## 2017-11-12 LAB — CBC WITH DIFFERENTIAL/PLATELET
Basophils Absolute: 0 10*3/uL (ref 0.0–0.2)
Basos: 0 %
EOS (ABSOLUTE): 0.1 10*3/uL (ref 0.0–0.4)
Eos: 1 %
HEMOGLOBIN: 15.5 g/dL (ref 13.0–17.7)
Hematocrit: 45.3 % (ref 37.5–51.0)
IMMATURE GRANS (ABS): 0 10*3/uL (ref 0.0–0.1)
Immature Granulocytes: 0 %
LYMPHS ABS: 1.7 10*3/uL (ref 0.7–3.1)
LYMPHS: 24 %
MCH: 29.6 pg (ref 26.6–33.0)
MCHC: 34.2 g/dL (ref 31.5–35.7)
MCV: 87 fL (ref 79–97)
MONOCYTES: 7 %
Monocytes Absolute: 0.5 10*3/uL (ref 0.1–0.9)
NEUTROS ABS: 4.7 10*3/uL (ref 1.4–7.0)
Neutrophils: 68 %
Platelets: 250 10*3/uL (ref 150–450)
RBC: 5.23 x10E6/uL (ref 4.14–5.80)
RDW: 14.2 % (ref 12.3–15.4)
WBC: 7 10*3/uL (ref 3.4–10.8)

## 2017-11-12 LAB — LIPID PANEL
CHOL/HDL RATIO: 4.4 ratio (ref 0.0–5.0)
CHOLESTEROL TOTAL: 190 mg/dL (ref 100–199)
HDL: 43 mg/dL (ref >39)
LDL CALC: 127 mg/dL — AB (ref 0–99)
Triglycerides: 101 mg/dL (ref 0–149)
VLDL Cholesterol Cal: 20 mg/dL (ref 5–40)

## 2017-11-21 MED FILL — SHIPPING COST: 1 days supply | Qty: 1 | Fill #5

## 2017-11-21 MED FILL — HYDROCHLOROTHIAZIDE 12.5 MG: 12.5 | 90 days supply | Qty: 90 | Fill #1

## 2017-12-02 ENCOUNTER — Other Ambulatory Visit: Payer: Self-pay

## 2017-12-02 MED ORDER — AZITHROMYCIN 250 MG PO TABS: ORAL_TABLET | ORAL | 0 refills | Status: DC

## 2017-12-05 ENCOUNTER — Encounter: Payer: Self-pay | Admitting: Family Medicine

## 2017-12-05 ENCOUNTER — Ambulatory Visit (INDEPENDENT_AMBULATORY_CARE_PROVIDER_SITE_OTHER): Payer: No Typology Code available for payment source | Admitting: Family Medicine

## 2017-12-05 VITALS — BP 126/80 | HR 68 | Temp 97.5°F | Wt 181.1 lb

## 2017-12-05 DIAGNOSIS — J0100 Acute maxillary sinusitis, unspecified: Secondary | ICD-10-CM | POA: Diagnosis not present

## 2017-12-05 DIAGNOSIS — J029 Acute pharyngitis, unspecified: Secondary | ICD-10-CM | POA: Diagnosis not present

## 2017-12-05 LAB — CULTURE, GROUP A STREP

## 2017-12-05 LAB — RAPID STREP SCREEN (MED CTR MEBANE ONLY): STREP GP A AG, IA W/REFLEX: NEGATIVE

## 2017-12-05 MED ORDER — AMOXICILLIN-POT CLAVULANATE 875-125 MG PO TABS
1.0000 | ORAL_TABLET | Freq: Two times a day (BID) | ORAL | 0 refills | Status: AC
Start: 2017-12-05 — End: 2017-12-15

## 2017-12-05 NOTE — Progress Notes (Signed)
Chief Complaint  Patient presents with  . Sore Throat    HPI  Patient presents today for patient just finished taking a Z-Pak today for a sore throat and some redness in the back of the mouth.  Symptoms started last Tuesday.  He continues to have some congestion as well as the sore throat.  There is not been any cough.  No fever chills or sweats.  No earaches.  There is some pressure and pain in the sinuses.  PMH: Smoking status noted ROS: Per HPI  Objective: BP 126/80   Pulse 68   Temp (!) 97.5 F (36.4 C) (Oral)   Wt 181 lb 2 oz (82.2 kg)   BMI 25.99 kg/m  Gen: NAD, alert, cooperative with exam HEENT: NCAT, EOMI, PERRL.  Nasal passages are swollen and erythematous the maxillary sinuses are tender to percussion.  There is posterior drainage tract with erythema noted in the posterior pharynx. Ext: No edema, warm Neuro: Alert and oriented, No gross deficits Strep screen negative Assessment and plan:  1. Acute maxillary sinusitis, recurrence not specified   2. Sore throat     Meds ordered this encounter  Medications  . amoxicillin-clavulanate (AUGMENTIN) 875-125 MG tablet    Sig: Take 1 tablet by mouth 2 (two) times daily for 10 days.    Dispense:  20 tablet    Refill:  0    Orders Placed This Encounter  Procedures  . Rapid Strep Screen ([facility] ONLY)  . Culture, Group A Strep    Order Specific Question:   Source    Answer:   THROAT  . Culture, Group A Strep    Follow up as needed.  Mechele ClaudeWarren Natale Barba, MD

## 2017-12-08 LAB — CULTURE, GROUP A STREP: Strep A Culture: NEGATIVE

## 2017-12-15 MED FILL — SHIPPING COST: 1 days supply | Qty: 1 | Fill #6

## 2017-12-15 MED FILL — AMLODIPINE BESYLATE 5 MG TA: 5 | 90 days supply | Qty: 90 | Fill #1

## 2017-12-22 ENCOUNTER — Other Ambulatory Visit: Payer: Self-pay | Admitting: Pediatrics

## 2017-12-22 MED FILL — SHIPPING COST: 1 days supply | Qty: 1 | Fill #7

## 2017-12-22 MED FILL — MONTELUKAST SOD 10 MG TAB: 10 | 90 days supply | Qty: 90 | Fill #0

## 2017-12-29 ENCOUNTER — Encounter: Payer: Self-pay | Admitting: Family

## 2017-12-29 ENCOUNTER — Ambulatory Visit (INDEPENDENT_AMBULATORY_CARE_PROVIDER_SITE_OTHER): Payer: No Typology Code available for payment source | Admitting: Family

## 2017-12-29 VITALS — BP 118/77 | HR 75 | Temp 97.3°F | Ht 70.0 in | Wt 174.4 lb

## 2017-12-29 DIAGNOSIS — R1031 Right lower quadrant pain: Secondary | ICD-10-CM

## 2017-12-29 DIAGNOSIS — R112 Nausea with vomiting, unspecified: Secondary | ICD-10-CM | POA: Diagnosis not present

## 2017-12-29 DIAGNOSIS — R197 Diarrhea, unspecified: Secondary | ICD-10-CM

## 2017-12-29 MED ORDER — ONDANSETRON 4 MG PO TBDP: 4.0000 mg | ORAL_TABLET | Freq: Three times a day (TID) | ORAL | 0 refills | Status: DC | PRN

## 2017-12-29 NOTE — Patient Instructions (Signed)
Diarrhea, Adult °Diarrhea is when you have loose and water poop (stool) often. Diarrhea can make you feel weak and cause you to get dehydrated. Dehydration can make you tired and thirsty, make you have a dry mouth, and make it so you pee (urinate) less often. Diarrhea often lasts 2-3 days. However, it can last longer if it is a sign of something more serious. It is important to treat your diarrhea as told by your doctor. °Follow these instructions at home: °Eating and drinking ° °Follow these recommendations as told by your doctor: °· Take an oral rehydration solution (ORS). This is a drink that is sold at pharmacies and stores. °· Drink clear fluids, such as: °? Water. °? Ice chips. °? Diluted fruit juice. °? Low-calorie sports drinks. °· Eat bland, easy-to-digest foods in small amounts as you are able. These foods include: °? Bananas. °? Applesauce. °? Rice. °? Low-fat (lean) meats. °? Toast. °? Crackers. °· Avoid drinking fluids that have a lot of sugar or caffeine in them. °· Avoid alcohol. °· Avoid spicy or fatty foods. ° °General instructions ° °· Drink enough fluid to keep your pee (urine) clear or pale yellow. °· Wash your hands often. If you cannot use soap and water, use hand sanitizer. °· Make sure that all people in your home wash their hands well and often. °· Take over-the-counter and prescription medicines only as told by your doctor. °· Rest at home while you get better. °· Watch your condition for any changes. °· Take a warm bath to help with any burning or pain from having diarrhea. °· Keep all follow-up visits as told by your doctor. This is important. °Contact a doctor if: °· You have a fever. °· Your diarrhea gets worse. °· You have new symptoms. °· You cannot keep fluids down. °· You feel light-headed or dizzy. °· You have a headache. °· You have muscle cramps. °Get help right away if: °· You have chest pain. °· You feel very weak or you pass out (faint). °· You have bloody or black poop or  poop that look like tar. °· You have very bad pain, cramping, or bloating in your belly (abdomen). °· You have trouble breathing or you are breathing very quickly. °· Your heart is beating very quickly. °· Your skin feels cold and clammy. °· You feel confused. °· You have signs of dehydration, such as: °? Dark pee, hardly any pee, or no pee. °? Cracked lips. °? Dry mouth. °? Sunken eyes. °? Sleepiness. °? Weakness. °This information is not intended to replace advice given to you by your health care provider. Make sure you discuss any questions you have with your health care provider. °Document Released: 10/20/2007 Document Revised: 11/21/2015 Document Reviewed: 01/07/2015 °Elsevier Interactive Patient Education © 2018 [employer] ° °

## 2017-12-29 NOTE — Progress Notes (Signed)
Subjective:    Patient ID: Jacob Benson, male    DOB: 27-Dec-1985, 32 y.o.   MRN: [id number]  Chief Complaint  Patient presents with  . Abdominal Pain  . Diarrhea  . Emesis  . Generalized Body Aches    Abdominal Pain  This is a new problem. The current episode started in the past 7 days. The onset quality is sudden. The problem occurs intermittently. The problem has been gradually worsening. The pain is located in the RLQ. The pain is at a severity of 5/10. The pain is mild. The quality of the pain is cramping. The abdominal pain radiates to the LLQ. Associated symptoms include diarrhea, a fever, myalgias, nausea and vomiting. Pertinent negatives include no belching, constipation, dysuria, frequency, headaches or hematuria. Nothing aggravates the pain. The pain is relieved by nothing. He has tried antacids for the symptoms. The treatment provided no relief.  Diarrhea   The problem occurs 5 to 10 times per day. Associated symptoms include abdominal pain, a fever, myalgias and vomiting. Pertinent negatives include no headaches.  Emesis   Associated symptoms include abdominal pain, diarrhea, a fever and myalgias. Pertinent negatives include no headaches.      Review of Systems  Constitutional: Positive for fever.  Gastrointestinal: Positive for abdominal pain, diarrhea, nausea and vomiting. Negative for constipation.  Genitourinary: Negative for dysuria, frequency and hematuria.  Musculoskeletal: Positive for myalgias.  Neurological: Negative for headaches.  All other systems reviewed and are negative.      Objective:   Physical Exam  Constitutional: He is oriented to person, place, and time. He appears well-developed and well-nourished. No distress.  HENT:  Head: Normocephalic.  Right Ear: External ear normal.  Left Ear: External ear normal.  Mouth/Throat: Oropharynx is clear and moist.  Eyes: Pupils are equal, round, and reactive to light. Right eye exhibits no discharge.  Left eye exhibits no discharge.  Neck: Normal range of motion. Neck supple. No thyromegaly present.  Cardiovascular: Normal rate, regular rhythm, normal heart sounds and intact distal pulses.  No murmur heard. Pulmonary/Chest: Effort normal and breath sounds normal. No respiratory distress. He has no wheezes.  Abdominal: Soft. Bowel sounds are normal. He exhibits no distension. There is tenderness in the right lower quadrant.  Musculoskeletal: Normal range of motion. He exhibits no edema or tenderness.  Neurological: He is alert and oriented to person, place, and time. He has normal reflexes. No cranial nerve deficit.  Skin: Skin is warm and dry. No rash noted. No erythema.  Psychiatric: He has a normal mood and affect. His behavior is normal. Judgment and thought content normal.  Vitals reviewed.     BP 118/77   Pulse 75   Temp (!) 97.3 F (36.3 C) (Oral)   Ht 5' 10"  (1.778 m)   Wt 174 lb 6.4 oz (79.1 kg)   BMI 25.02 kg/m      Assessment & Plan:  Jacob Benson comes in today with chief complaint of Abdominal Pain; Diarrhea; Emesis; and Generalized Body Aches   Diagnosis and orders addressed:  1. Diarrhea, unspecified type - CBC with Differential/Platelet - BMP8+EGFR - Cdiff NAA+O+P+Stool Culture  2. Right lower quadrant abdominal pain - CBC with Differential/Platelet - BMP8+EGFR  3. Nausea and vomiting, intractability of vomiting not specified, unspecified vomiting type - ondansetron (ZOFRAN ODT) 4 MG disintegrating tablet; Take 1 tablet (4 mg total) by mouth every 8 (eight) hours as needed for nausea or vomiting.  Dispense: 20 tablet; Refill: 0  Bland diet Force fluids Labs pending If abdominal worsen go to ED    Jacob Dun, FNP

## 2017-12-30 ENCOUNTER — Other Ambulatory Visit: Payer: Self-pay | Admitting: *Deleted

## 2017-12-30 LAB — BMP8+EGFR
BUN/Creatinine Ratio: 14 (ref 9–20)
BUN: 17 mg/dL (ref 6–20)
CALCIUM: 9.2 mg/dL (ref 8.7–10.2)
CHLORIDE: 96 mmol/L (ref 96–106)
CO2: 27 mmol/L (ref 20–29)
CREATININE: 1.22 mg/dL (ref 0.76–1.27)
GFR, EST AFRICAN AMERICAN: 91 mL/min/{1.73_m2} (ref >59)
GFR, EST NON AFRICAN AMERICAN: 79 mL/min/{1.73_m2} (ref >59)
Glucose: 74 mg/dL (ref 65–99)
Potassium: 3.6 mmol/L (ref 3.5–5.2)
Sodium: 139 mmol/L (ref 134–144)

## 2017-12-30 LAB — CBC WITH DIFFERENTIAL/PLATELET
BASOS: 0 %
Basophils Absolute: 0 10*3/uL (ref 0.0–0.2)
EOS (ABSOLUTE): 0.1 10*3/uL (ref 0.0–0.4)
EOS: 1 %
Hematocrit: 46 % (ref 37.5–51.0)
Hemoglobin: 15.8 g/dL (ref 13.0–17.7)
IMMATURE GRANS (ABS): 0 10*3/uL (ref 0.0–0.1)
IMMATURE GRANULOCYTES: 1 %
LYMPHS: 16 %
Lymphocytes Absolute: 1.2 10*3/uL (ref 0.7–3.1)
MCH: 30.2 pg (ref 26.6–33.0)
MCHC: 34.3 g/dL (ref 31.5–35.7)
MCV: 88 fL (ref 79–97)
Monocytes Absolute: 0.9 10*3/uL (ref 0.1–0.9)
Monocytes: 12 %
NEUTROS ABS: 4.9 10*3/uL (ref 1.4–7.0)
NEUTROS PCT: 70 %
PLATELETS: 261 10*3/uL (ref 150–450)
RBC: 5.23 x10E6/uL (ref 4.14–5.80)
RDW: 14.1 % (ref 12.3–15.4)
WBC: 7.1 10*3/uL (ref 3.4–10.8)

## 2017-12-30 MED ORDER — TRAZODONE HCL 50 MG PO TABS: 25.0000 mg | ORAL_TABLET | Freq: Every evening | ORAL | 3 refills | Status: DC | PRN

## 2017-12-30 NOTE — Telephone Encounter (Signed)
Patient needs refill on trazadone

## 2018-01-03 LAB — CDIFF NAA+O+P+STOOL CULTURE
E COLI SHIGA TOXIN ASSAY: NEGATIVE
Toxigenic C. Difficile by PCR: NEGATIVE

## 2018-01-12 ENCOUNTER — Other Ambulatory Visit: Payer: Self-pay

## 2018-01-12 MED ORDER — ESOMEPRAZOLE MAGNESIUM 40 MG PO CPDR: 40.0000 mg | DELAYED_RELEASE_CAPSULE | Freq: Every day | ORAL | 1 refills | Status: DC

## 2018-01-12 MED FILL — ESOMEPRAZOLE MAG DR 40 MG C: 40 | 90 days supply | Qty: 90 | Fill #0

## 2018-01-12 MED FILL — SHIPPING COST: 1 days supply | Qty: 1 | Fill #8

## 2018-02-20 ENCOUNTER — Other Ambulatory Visit: Payer: Self-pay

## 2018-02-20 MED ORDER — HYDROCHLOROTHIAZIDE 12.5 MG PO CAPS: 12.5000 mg | ORAL_CAPSULE | Freq: Every day | ORAL | 1 refills | Status: DC

## 2018-02-20 MED FILL — SHIPPING COST: 1 days supply | Qty: 1 | Fill #9

## 2018-02-20 MED FILL — HYDROCHLOROTHIAZIDE 12.5 MG: 12.5 | 90 days supply | Qty: 90 | Fill #0

## 2018-03-20 ENCOUNTER — Other Ambulatory Visit: Payer: Self-pay

## 2018-03-20 MED ORDER — AMLODIPINE BESYLATE 5 MG PO TABS: 5.0000 mg | ORAL_TABLET | Freq: Every day | ORAL | 1 refills | Status: DC

## 2018-03-20 MED FILL — AMLODIPINE BESYLATE 5 MG TA: 5 | 90 days supply | Qty: 90 | Fill #0

## 2018-03-20 MED FILL — MONTELUKAST SOD 10 MG TAB: 10 | 90 days supply | Qty: 90 | Fill #1

## 2018-03-20 MED FILL — SHIPPING COST: 1 days supply | Qty: 1 | Fill #10

## 2018-04-14 MED FILL — ESOMEPRAZOLE MAG DR 40 MG C: 40 | 90 days supply | Qty: 90 | Fill #1

## 2018-04-14 MED FILL — SHIPPING COST: 1 days supply | Qty: 1 | Fill #11

## 2018-05-19 MED FILL — SHIPPING COST: 1 days supply | Qty: 1 | Fill #0

## 2018-05-19 MED FILL — HYDROCHLOROTHIAZIDE 12.5 MG: 12.5 | 90 days supply | Qty: 90 | Fill #1

## 2018-06-02 ENCOUNTER — Other Ambulatory Visit: Payer: Self-pay

## 2018-06-02 MED ORDER — OSELTAMIVIR PHOSPHATE 75 MG PO CAPS: 75.0000 mg | ORAL_CAPSULE | Freq: Every day | ORAL | 0 refills | Status: DC

## 2018-06-02 NOTE — Telephone Encounter (Signed)
Patient exposed to flu, son has positive flu b.  Per Kari Baars, okay to send in Tamiflu 75 mg daily x 10 days to [facility].

## 2018-06-12 MED FILL — AMLODIPINE BESYLATE 5 MG TA: 5 | 90 days supply | Qty: 90 | Fill #1

## 2018-06-12 MED FILL — SHIPPING COST: 1 days supply | Qty: 1 | Fill #1

## 2018-06-12 MED FILL — MONTELUKAST SOD 10 MG TAB: 10 | 90 days supply | Qty: 90 | Fill #2

## 2018-06-15 ENCOUNTER — Ambulatory Visit (INDEPENDENT_AMBULATORY_CARE_PROVIDER_SITE_OTHER): Payer: No Typology Code available for payment source | Admitting: Family Medicine

## 2018-06-15 ENCOUNTER — Encounter: Payer: Self-pay | Admitting: Family Medicine

## 2018-06-15 ENCOUNTER — Ambulatory Visit: Payer: No Typology Code available for payment source | Admitting: Family Medicine

## 2018-06-15 VITALS — BP 139/89 | HR 63 | Temp 97.3°F | Ht 70.0 in | Wt 186.0 lb

## 2018-06-15 DIAGNOSIS — I10 Essential (primary) hypertension: Secondary | ICD-10-CM | POA: Diagnosis not present

## 2018-06-15 DIAGNOSIS — F411 Generalized anxiety disorder: Secondary | ICD-10-CM | POA: Diagnosis not present

## 2018-06-15 DIAGNOSIS — F321 Major depressive disorder, single episode, moderate: Secondary | ICD-10-CM

## 2018-06-15 MED ORDER — HYDROCHLOROTHIAZIDE 12.5 MG PO CAPS: 12.5000 mg | ORAL_CAPSULE | Freq: Every day | ORAL | 1 refills | Status: DC

## 2018-06-15 MED ORDER — AMLODIPINE BESYLATE 5 MG PO TABS: 5.0000 mg | ORAL_TABLET | Freq: Every day | ORAL | 1 refills | Status: DC

## 2018-06-15 MED ORDER — SERTRALINE HCL 50 MG PO TABS: 50.0000 mg | ORAL_TABLET | Freq: Every day | ORAL | 5 refills | Status: DC

## 2018-06-15 MED ORDER — HYDROXYZINE PAMOATE 25 MG PO CAPS: 25.0000 mg | ORAL_CAPSULE | Freq: Three times a day (TID) | ORAL | 3 refills | Status: DC | PRN

## 2018-06-15 NOTE — Progress Notes (Signed)
Subjective:    Patient ID: Jacob Benson, male    DOB: 1985-11-15, 33 y.o.   MRN: [id number]  Chief Complaint:  Anxiety; Trouble sleeping; and Medical Management of Chronic Issues   HPI: Jacob Benson is a 33 y.o. male presenting on 06/15/2018 for Anxiety; Trouble sleeping; and Medical Management of Chronic Issues   1. Essential hypertension  Complaint with meds - Yes Checking BP at home ranging 130/80 Exercising Regularly - No Watching Salt intake - No Pertinent ROS:  Headache - No Chest pain - No Dyspnea - No Palpitations - Yes LE edema - No They report good compliance with medications and can restate their regimen by memory. No medication side effects.  BP Readings from Last 3 Encounters:  06/15/18 139/89  12/29/17 118/77  12/05/17 126/80     2. GAD (generalized anxiety disorder) GAD 7 : Generalized Anxiety Score 06/15/2018  Nervous, Anxious, on Edge 3  Control/stop worrying 2  Worry too much - different things 3  Trouble relaxing 3  Restless 0  Easily annoyed or irritable 3  Afraid - awful might happen 1  Total GAD 7 Score 15  Anxiety Difficulty Somewhat difficult      3. MDD (major depressive disorder), single episode, moderate (HCC)  Depression screen [facility] 2/9 06/15/2018 06/15/2018 12/29/2017 12/05/2017 03/04/2017  Decreased Interest 0 0 0 0 0  Down, Depressed, Hopeless 0 0 0 0 0  PHQ - 2 Score 0 0 0 0 0  Altered sleeping 3 - - - -  Tired, decreased energy 3 - - - -  Change in appetite 0 - - - -  Feeling bad or failure about yourself  0 - - - -  Trouble concentrating 0 - - - -  Moving slowly or fidgety/restless 0 - - - -  Suicidal thoughts 0 - - - -  PHQ-9 Score 6 - - - -  Difficult doing work/chores Somewhat difficult - - - -      Ongoing for several months and worsening over time. States he has a hard time falling asleep and staying asleep due to his constant worrying. States he has tried to manage without taking any mediations but symptoms are  getting worse and starting to disrupt his home life. He states it is hard to enjoy things because of his mood and worrying. States he has noticed himself not wanting to do things of be in crowds of people because he becomes anxious. States he feels fatigued all of the time because he is not sleeping well. States he has a lot of family issues he is dealing with and this is contributing to his anxiety. States he does have a family history of mental illness on his paternal and maternal side. States his mother and father are both on medications for anxiety. He states he has never been on medication for this in the past. He denies SI and HI.  Relevant past medical, surgical, family, and social history reviewed and updated as indicated.  Allergies and medications reviewed and updated.   Past Medical History:  Diagnosis Date  . Calcium kidney stones 2007  . Hyperlipidemia   . Hypertension   . Olecranon fracture    left    Past Surgical History:  Procedure Laterality Date  . FRACTURE SURGERY    . I&D EXTREMITY Left 09/13/2015   Procedure: IRRIGATION AND DEBRIDEMENT EXTREMITY;  Surgeon: Leandrew Koyanagi, MD;  Location: [place];  Service: Orthopedics;  Laterality: Left;  .  ORIF ELBOW FRACTURE Left 10/30/2015   Procedure: OPEN REDUCTION INTERNAL FIXATION (ORIF) LEFT OLECRANON FRACTURE;  Surgeon: Leandrew Koyanagi, MD;  Location: [place];  Service: Orthopedics;  Laterality: Left;  . ORIF HUMERUS FRACTURE Left 09/13/2015   Procedure: OPEN REDUCTION INTERNAL FIXATION (ORIF) ELBOW/OLECRANON AND I AND D;  Surgeon: Leandrew Koyanagi, MD;  Location: [place];  Service: Orthopedics;  Laterality: Left;  . TONSILLECTOMY      Social History   Socioeconomic History  . Marital status: Married    Spouse name: Not on file  . Number of children: Not on file  . Years of education: Not on file  . Highest education level: Not on file  Occupational History  . Not on file  Social Needs  . Financial resource strain: Not on file  .  Food insecurity:    Worry: Not on file    Inability: Not on file  . Transportation needs:    Medical: Not on file    Non-medical: Not on file  Tobacco Use  . Smoking status: Former Smoker    Packs/day: 0.50    Types: Cigarettes    Last attempt to quit: 05/23/2013    Years since quitting: 5.0  . Smokeless tobacco: Former Systems developer    Types: Snuff  Substance and Sexual Activity  . Alcohol use: No    Alcohol/week: 0.0 standard drinks  . Drug use: No  . Sexual activity: Not on file  Lifestyle  . Physical activity:    Days per week: Not on file    Minutes per session: Not on file  . Stress: Not on file  Relationships  . Social connections:    Talks on phone: Not on file    Gets together: Not on file    Attends religious service: Not on file    Active member of club or organization: Not on file    Attends meetings of clubs or organizations: Not on file    Relationship status: Not on file  . Intimate partner violence:    Fear of current or ex partner: Not on file    Emotionally abused: Not on file    Physically abused: Not on file    Forced sexual activity: Not on file  Other Topics Concern  . Not on file  Social History Narrative  . Not on file    Outpatient Encounter Medications as of 06/15/2018  Medication Sig  . albuterol (PROVENTIL HFA;VENTOLIN HFA) 108 (90 Base) MCG/ACT inhaler Inhale 2 puffs into the lungs every 6 (six) hours as needed for wheezing or shortness of breath.  Marland Kitchen amLODipine (NORVASC) 5 MG tablet Take 1 tablet (5 mg total) by mouth daily.  Marland Kitchen esomeprazole (NEXIUM) 40 MG capsule Take 1 capsule (40 mg total) by mouth daily.  . hydrochlorothiazide (MICROZIDE) 12.5 MG capsule Take 1 capsule (12.5 mg total) by mouth daily.  . montelukast (SINGULAIR) 10 MG tablet TAKE 1 TABLET BY MOUTH AT BEDTIME  . traZODone (DESYREL) 50 MG tablet Take 0.5-1 tablets (25-50 mg total) by mouth at bedtime as needed for sleep.  . [DISCONTINUED] amLODipine (NORVASC) 5 MG tablet Take 1  tablet (5 mg total) by mouth daily.  . [DISCONTINUED] hydrochlorothiazide (MICROZIDE) 12.5 MG capsule Take 1 capsule (12.5 mg total) by mouth daily.  . hydrOXYzine (VISTARIL) 25 MG capsule Take 1 capsule (25 mg total) by mouth 3 (three) times daily as needed.  . sertraline (ZOLOFT) 50 MG tablet Take 1 tablet (50 mg total) by mouth daily.  . [  DISCONTINUED] ondansetron (ZOFRAN ODT) 4 MG disintegrating tablet Take 1 tablet (4 mg total) by mouth every 8 (eight) hours as needed for nausea or vomiting.  . [DISCONTINUED] oseltamivir (TAMIFLU) 75 MG capsule Take 1 capsule (75 mg total) by mouth daily.  . [DISCONTINUED] methylPREDNISolone acetate (DEPO-MEDROL) injection 40 mg    No facility-administered encounter medications on file as of 06/15/2018.     Allergies  Allergen Reactions  . Guaifenesin & Derivatives Palpitations  . Oxycodone Nausea And Vomiting    Review of Systems  Constitutional: Positive for fatigue. Negative for chills and fever.  Eyes: Negative for photophobia and visual disturbance.  Respiratory: Negative for cough, chest tightness and shortness of breath.   Cardiovascular: Positive for palpitations. Negative for chest pain and leg swelling.  Gastrointestinal: Negative for abdominal pain, constipation, diarrhea, nausea and vomiting.  Endocrine: Negative for polydipsia, polyphagia and polyuria.  Musculoskeletal: Negative for arthralgias and myalgias.  Neurological: Negative for dizziness, weakness, light-headedness, numbness and headaches.  Psychiatric/Behavioral: Positive for agitation, behavioral problems, decreased concentration, dysphoric mood and sleep disturbance. Negative for confusion, hallucinations, self-injury and suicidal ideas. The patient is nervous/anxious. The patient is not hyperactive.   All other systems reviewed and are negative.       Objective:    BP 139/89   Pulse 63   Temp (!) 97.3 F (36.3 C) (Oral)   Ht 5' 10"  (1.778 m)   Wt 186 lb (84.4 kg)    BMI 26.69 kg/m    Wt Readings from Last 3 Encounters:  06/15/18 186 lb (84.4 kg)  12/29/17 174 lb 6.4 oz (79.1 kg)  12/05/17 181 lb 2 oz (82.2 kg)    Physical Exam Vitals signs and nursing note reviewed.  Constitutional:      General: He is not in acute distress.    Appearance: Normal appearance. He is well-developed and well-groomed. He is not ill-appearing or toxic-appearing.  HENT:     Head: Normocephalic and atraumatic.     Nose: Nose normal.     Mouth/Throat:     Mouth: Mucous membranes are moist.     Pharynx: Oropharynx is clear.  Eyes:     Extraocular Movements: Extraocular movements intact.     Conjunctiva/sclera: Conjunctivae normal.     Pupils: Pupils are equal, round, and reactive to light.  Neck:     Musculoskeletal: Normal range of motion and neck supple. No neck rigidity.     Thyroid: No thyroid mass, thyromegaly or thyroid tenderness.     Vascular: No carotid bruit.  Cardiovascular:     Rate and Rhythm: Normal rate and regular rhythm.     Heart sounds: Normal heart sounds. No murmur. No friction rub. No gallop.   Pulmonary:     Effort: Pulmonary effort is normal.     Breath sounds: Normal breath sounds.  Lymphadenopathy:     Cervical: No cervical adenopathy.  Neurological:     Mental Status: He is alert.  Psychiatric:        Attention and Perception: Attention and perception normal.        Mood and Affect: Affect normal. Mood is anxious.        Behavior: Behavior normal. Behavior is cooperative.        Thought Content: Thought content is not paranoid or delusional. Thought content does not include homicidal or suicidal ideation. Thought content does not include homicidal or suicidal plan.        Cognition and Memory: Cognition and memory normal.  Judgment: Judgment normal.     Results for orders placed or performed in visit on 12/29/17  Cdiff NAA+O+P+Stool Culture  Result Value Ref Range   Salmonella/Shigella Screen Final report    Stool  Culture result 1 (RSASHR) Comment    Campylobacter Culture Final report    Stool Culture result 1 (CMPCXR) Comment    E coli, Shiga toxin Assay Negative Negative   OVA + PARASITE EXAM Final report    O&P result 1 Comment    Toxigenic C. Difficile by PCR Negative Negative  CBC with Differential/Platelet  Result Value Ref Range   WBC 7.1 3.4 - 10.8 x10E3/uL   RBC 5.23 4.14 - 5.80 x10E6/uL   Hemoglobin 15.8 13.0 - 17.7 g/dL   Hematocrit 46.0 37.5 - 51.0 %   MCV 88 79 - 97 fL   MCH 30.2 26.6 - 33.0 pg   MCHC 34.3 31.5 - 35.7 g/dL   RDW 14.1 12.3 - 15.4 %   Platelets 261 150 - 450 x10E3/uL   Neutrophils 70 Not Estab. %   Lymphs 16 Not Estab. %   Monocytes 12 Not Estab. %   Eos 1 Not Estab. %   Basos 0 Not Estab. %   Neutrophils Absolute 4.9 1.4 - 7.0 x10E3/uL   Lymphocytes Absolute 1.2 0.7 - 3.1 x10E3/uL   Monocytes Absolute 0.9 0.1 - 0.9 x10E3/uL   EOS (ABSOLUTE) 0.1 0.0 - 0.4 x10E3/uL   Basophils Absolute 0.0 0.0 - 0.2 x10E3/uL   Immature Granulocytes 1 Not Estab. %   Immature Grans (Abs) 0.0 0.0 - 0.1 x10E3/uL  BMP8+EGFR  Result Value Ref Range   Glucose 74 65 - 99 mg/dL   BUN 17 6 - 20 mg/dL   Creatinine, Ser 1.22 0.76 - 1.27 mg/dL   GFR calc non Af Amer 79 >59 mL/min/1.73   GFR calc Af Amer 91 >59 mL/min/1.73   BUN/Creatinine Ratio 14 9 - 20   Sodium 139 134 - 144 mmol/L   Potassium 3.6 3.5 - 5.2 mmol/L   Chloride 96 96 - 106 mmol/L   CO2 27 20 - 29 mmol/L   Calcium 9.2 8.7 - 10.2 mg/dL       Pertinent labs & imaging results that were available during my care of the patient were reviewed by me and considered in my medical decision making.  Assessment & Plan:  Loren was seen today for anxiety, trouble sleeping and medical management of chronic issues.  Diagnoses and all orders for this visit:  Essential hypertension Labs pending. Diet and exercise encouraged. Continue medications as prescribed.  -     CMP14+EGFR -     CBC with Differential/Platelet -      Lipid panel -     TSH -     Microalbumin / creatinine urine ratio -     hydrochlorothiazide (MICROZIDE) 12.5 MG capsule; Take 1 capsule (12.5 mg total) by mouth daily. -     amLODipine (NORVASC) 5 MG tablet; Take 1 tablet (5 mg total) by mouth daily.  GAD (generalized anxiety disorder) Declined counselor. Will initiate SSRI today and check TSH. Vistaril as needed. Follow up in 2 weeks.  -     TSH -     sertraline (ZOLOFT) 50 MG tablet; Take 1 tablet (50 mg total) by mouth daily. -     hydrOXYzine (VISTARIL) 25 MG capsule; Take 1 capsule (25 mg total) by mouth 3 (three) times daily as needed.  MDD (major depressive disorder), single episode, moderate ([place])  Declined counselor. Will initiate SSRI and check TSH today. Vistaril as needed for anxiety and insomnia. Follow up in 2 weeks.  -     TSH -     sertraline (ZOLOFT) 50 MG tablet; Take 1 tablet (50 mg total) by mouth daily.     Continue all other maintenance medications.  Follow up plan: Return in about 2 weeks (around 06/29/2018), or if symptoms worsen or fail to improve.  Educational handout given for depression and anxiety  The above assessment and management plan was discussed with the patient. The patient verbalized understanding of and has agreed to the management plan. Patient is aware to call the clinic if symptoms persist or worsen. Patient is aware when to return to the clinic for a follow-up visit. Patient educated on when it is appropriate to go to the emergency department.   Monia Pouch, FNP-C [facility] [phone]

## 2018-06-15 NOTE — Patient Instructions (Signed)
Generalized Anxiety Disorder, Adult Generalized anxiety disorder (GAD) is a mental health disorder. People with this condition constantly worry about everyday events. Unlike normal anxiety, worry related to GAD is not triggered by a specific event. These worries also do not fade or get better with time. GAD interferes with life functions, including relationships, work, and school. GAD can vary from mild to severe. People with severe GAD can have intense waves of anxiety with physical symptoms (panic attacks). What are the causes? The exact cause of GAD is not known. What increases the risk? This condition is more likely to develop in:  Women.  People who have a family history of anxiety disorders.  People who are very shy.  People who experience very stressful life events, such as the death of a loved one.  People who have a very stressful family environment. What are the signs or symptoms? People with GAD often worry excessively about many things in their lives, such as their health and family. They may also be overly concerned about:  Doing well at work.  Being on time.  Natural disasters.  Friendships. Physical symptoms of GAD include:  Fatigue.  Muscle tension or having muscle twitches.  Trembling or feeling shaky.  Being easily startled.  Feeling like your heart is pounding or racing.  Feeling out of breath or like you cannot take a deep breath.  Having trouble falling asleep or staying asleep.  Sweating.  Nausea, diarrhea, or irritable bowel syndrome (IBS).  Headaches.  Trouble concentrating or remembering facts.  Restlessness.  Irritability. How is this diagnosed? Your health care provider can diagnose GAD based on your symptoms and medical history. You will also have a physical exam. The health care provider will ask specific questions about your symptoms, including how severe they are, when they started, and if they come and go. Your health care  provider may ask you about your use of alcohol or drugs, including prescription medicines. Your health care provider may refer you to a mental health specialist for further evaluation. Your health care provider will do a thorough examination and may perform additional tests to rule out other possible causes of your symptoms. To be diagnosed with GAD, a person must have anxiety that:  Is out of his or her control.  Affects several different aspects of his or her life, such as work and relationships.  Causes distress that makes him or her unable to take part in normal activities.  Includes at least three physical symptoms of GAD, such as restlessness, fatigue, trouble concentrating, irritability, muscle tension, or sleep problems. Before your health care provider can confirm a diagnosis of GAD, these symptoms must be present more days than they are not, and they must last for six months or longer. How is this treated? The following therapies are usually used to treat GAD:  Medicine. Antidepressant medicine is usually prescribed for long-term daily control. Antianxiety medicines may be added in severe cases, especially when panic attacks occur.  Talk therapy (psychotherapy). Certain types of talk therapy can be helpful in treating GAD by providing support, education, and guidance. Options include: ? Cognitive behavioral therapy (CBT). People learn coping skills and techniques to ease their anxiety. They learn to identify unrealistic or negative thoughts and behaviors and to replace them with positive ones. ? Acceptance and commitment therapy (ACT). This treatment teaches people how to be mindful as a way to cope with unwanted thoughts and feelings. ? Biofeedback. This process trains you to manage your body's response (  physiological response) through breathing techniques and relaxation methods. You will work with a therapist while machines are used to monitor your physical symptoms.  Stress  management techniques. These include yoga, meditation, and exercise. A mental health specialist can help determine which treatment is best for you. Some people see improvement with one type of therapy. However, other people require a combination of therapies. Follow these instructions at home:  Take over-the-counter and prescription medicines only as told by your health care provider.  Try to maintain a normal routine.  Try to anticipate stressful situations and allow extra time to manage them.  Practice any stress management or self-calming techniques as taught by your health care provider.  Do not punish yourself for setbacks or for not making progress.  Try to recognize your accomplishments, even if they are small.  Keep all follow-up visits as told by your health care provider. This is important. Contact a health care provider if:  Your symptoms do not get better.  Your symptoms get worse.  You have signs of depression, such as: ? A persistently sad, cranky, or irritable mood. ? Loss of enjoyment in activities that used to bring you joy. ? Change in weight or eating. ? Changes in sleeping habits. ? Avoiding friends or family members. ? Loss of energy for normal tasks. ? Feelings of guilt or worthlessness. Get help right away if:  You have serious thoughts about hurting yourself or others. If you ever feel like you may hurt yourself or others, or have thoughts about taking your own life, get help right away. You can go to your nearest emergency department or call:  Your local emergency services (911 in the [place]  A suicide crisis helpline, such as the [facility] at 2188233781. This is open 24 hours a day. Summary  Generalized anxiety disorder (GAD) is a mental health disorder that involves worry that is not triggered by a specific event.  People with GAD often worry excessively about many things in their lives, such as their health and  family.  GAD may cause physical symptoms such as restlessness, trouble concentrating, sleep problems, frequent sweating, nausea, diarrhea, headaches, and trembling or muscle twitching.  A mental health specialist can help determine which treatment is best for you. Some people see improvement with one type of therapy. However, other people require a combination of therapies. This information is not intended to replace advice given to you by your health care provider. Make sure you discuss any questions you have with your health care provider. Document Released: 08/28/2012 Document Revised: 03/23/2016 Document Reviewed: 03/23/2016 Elsevier Interactive Patient Education  2019 [place]. Major Depressive Disorder, Adult Major depressive disorder (MDD) is a mental health condition. MDD often makes you feel sad, hopeless, or helpless. MDD can also cause symptoms in your body. MDD can affect your:  Work.  School.  Relationships.  Other normal activities. MDD can range from mild to very bad. It may occur once (single episode MDD). It can also occur many times (recurrent MDD). The main symptoms of MDD often include:  Feeling sad, depressed, or irritable most of the time.  Loss of interest. MDD symptoms also include:  Sleeping too much or too little.  Eating too much or too little.  A change in your weight.  Feeling tired (fatigue) or having low energy.  Feeling worthless.  Feeling guilty.  Trouble making decisions.  Trouble thinking clearly.  Thoughts of suicide or harming others.  Feeling weak.  Feeling agitated.  can also occur many times (recurrent MDD).  The main symptoms of MDD often include:   Feeling sad, depressed, or irritable most of the time.   Loss of interest.  MDD symptoms also include:   Sleeping too much or too little.   Eating too much or too little.   A change in your weight.   Feeling tired (fatigue) or having low energy.   Feeling worthless.   Feeling guilty.   Trouble making decisions.   Trouble thinking clearly.   Thoughts of suicide or harming others.   Feeling weak.   Feeling agitated.   Keeping yourself from being around other people (isolation).  Follow these instructions at home:  Activity   Do these things as told by your doctor:  ? Go back to your normal activities.  ? Exercise regularly.  ? Spend time outdoors.  Alcohol   Talk with your doctor about how alcohol can affect your antidepressant medicines.   Do not drink alcohol. Or, limit  how much alcohol you drink.  ? This means no more than 1 drink a day for nonpregnant women and 2 drinks a day for men. One drink equals one of these:   12 oz of beer.   5 oz of wine.   1 oz of hard liquor.  General instructions   Take over-the-counter and prescription medicines only as told by your doctor.   Eat a healthy diet.   Get plenty of sleep.   Find activities that you enjoy. Make time to do them.   Think about joining a support group. Your doctor may be able to suggest a group for you.   Keep all follow-up visits as told by your doctor. This is important.  Where to find more information:   National Alliance on Mental Illness:  ? www.nami.org   [place] National Institute of Mental Health:  ? www.nimh.nih.gov   National Suicide Prevention Lifeline:  ? 1-800-273-8255. This is free, 24-hour help.  Contact a doctor if:   Your symptoms get worse.   You have new symptoms.  Get help right away if:   You self-harm.   You see, hear, taste, smell, or feel things that are not present (hallucinate).  If you ever feel like you may hurt yourself or others, or have thoughts about taking your own life, get help right away. You can go to your nearest emergency department or call:   Your local emergency services (911 in the [place]   A suicide crisis helpline, such as the National Suicide Prevention Lifeline:  ? 1-800-273-8255. This is open 24 hours a day.  This information is not intended to replace advice given to you by your health care provider. Make sure you discuss any questions you have with your health care provider.  Document Released: 04/14/2015 Document Revised: 01/18/2016 Document Reviewed: 01/18/2016  Elsevier Interactive Patient Education  2019 [employer] Inc[employer]

## 2018-06-16 ENCOUNTER — Ambulatory Visit: Payer: No Typology Code available for payment source | Admitting: Family Medicine

## 2018-06-16 LAB — CMP14+EGFR
ALT: 15 IU/L (ref 0–44)
AST: 14 IU/L (ref 0–40)
Albumin/Globulin Ratio: 1.8 (ref 1.2–2.2)
Albumin: 4.5 g/dL (ref 4.0–5.0)
Alkaline Phosphatase: 80 IU/L (ref 39–117)
BILIRUBIN TOTAL: 0.6 mg/dL (ref 0.0–1.2)
BUN / CREAT RATIO: 14 (ref 9–20)
BUN: 17 mg/dL (ref 6–20)
CHLORIDE: 99 mmol/L (ref 96–106)
CO2: 26 mmol/L (ref 20–29)
Calcium: 9.6 mg/dL (ref 8.7–10.2)
Creatinine, Ser: 1.24 mg/dL (ref 0.76–1.27)
GFR calc non Af Amer: 77 mL/min/{1.73_m2} (ref >59)
GFR, EST AFRICAN AMERICAN: 89 mL/min/{1.73_m2} (ref >59)
Globulin, Total: 2.5 g/dL (ref 1.5–4.5)
Glucose: 90 mg/dL (ref 65–99)
POTASSIUM: 4.1 mmol/L (ref 3.5–5.2)
Sodium: 142 mmol/L (ref 134–144)
TOTAL PROTEIN: 7 g/dL (ref 6.0–8.5)

## 2018-06-16 LAB — LIPID PANEL
CHOLESTEROL TOTAL: 257 mg/dL — AB (ref 100–199)
Chol/HDL Ratio: 6.4 ratio — ABNORMAL HIGH (ref 0.0–5.0)
HDL: 40 mg/dL (ref >39)
LDL CALC: 193 mg/dL — AB (ref 0–99)
TRIGLYCERIDES: 120 mg/dL (ref 0–149)
VLDL CHOLESTEROL CAL: 24 mg/dL (ref 5–40)

## 2018-06-16 LAB — CBC WITH DIFFERENTIAL/PLATELET
BASOS: 1 %
Basophils Absolute: 0 10*3/uL (ref 0.0–0.2)
EOS (ABSOLUTE): 0.1 10*3/uL (ref 0.0–0.4)
EOS: 2 %
HEMATOCRIT: 48.2 % (ref 37.5–51.0)
HEMOGLOBIN: 16 g/dL (ref 13.0–17.7)
IMMATURE GRANS (ABS): 0 10*3/uL (ref 0.0–0.1)
IMMATURE GRANULOCYTES: 0 %
LYMPHS: 26 %
Lymphocytes Absolute: 1.9 10*3/uL (ref 0.7–3.1)
MCH: 28.1 pg (ref 26.6–33.0)
MCHC: 33.2 g/dL (ref 31.5–35.7)
MCV: 85 fL (ref 79–97)
MONOCYTES: 8 %
Monocytes Absolute: 0.6 10*3/uL (ref 0.1–0.9)
NEUTROS PCT: 63 %
Neutrophils Absolute: 4.5 10*3/uL (ref 1.4–7.0)
PLATELETS: 296 10*3/uL (ref 150–450)
RBC: 5.7 x10E6/uL (ref 4.14–5.80)
RDW: 13.4 % (ref 11.6–15.4)
WBC: 7.2 10*3/uL (ref 3.4–10.8)

## 2018-06-16 LAB — TSH: TSH: 2.47 u[IU]/mL (ref 0.450–4.500)

## 2018-06-16 LAB — MICROALBUMIN / CREATININE URINE RATIO
CREATININE, UR: 103.6 mg/dL
Microalb/Creat Ratio: <3 mg/g creat (ref 0–29)

## 2018-06-29 ENCOUNTER — Ambulatory Visit (INDEPENDENT_AMBULATORY_CARE_PROVIDER_SITE_OTHER): Payer: No Typology Code available for payment source | Admitting: Family Medicine

## 2018-06-29 ENCOUNTER — Encounter: Payer: Self-pay | Admitting: Family Medicine

## 2018-06-29 VITALS — BP 127/67 | HR 70 | Temp 98.4°F | Ht 70.0 in | Wt 184.6 lb

## 2018-06-29 DIAGNOSIS — F5105 Insomnia due to other mental disorder: Secondary | ICD-10-CM

## 2018-06-29 DIAGNOSIS — F409 Phobic anxiety disorder, unspecified: Secondary | ICD-10-CM | POA: Insufficient documentation

## 2018-06-29 DIAGNOSIS — F411 Generalized anxiety disorder: Secondary | ICD-10-CM

## 2018-06-29 DIAGNOSIS — F321 Major depressive disorder, single episode, moderate: Secondary | ICD-10-CM | POA: Diagnosis not present

## 2018-06-29 DIAGNOSIS — G479 Sleep disorder, unspecified: Secondary | ICD-10-CM

## 2018-06-29 MED ORDER — SUVOREXANT 10 MG PO TABS: 10.0000 mg | ORAL_TABLET | Freq: Every evening | ORAL | 3 refills | Status: DC | PRN

## 2018-06-29 MED ORDER — HYDROXYZINE PAMOATE 25 MG PO CAPS: 25.0000 mg | ORAL_CAPSULE | Freq: Three times a day (TID) | ORAL | 3 refills | Status: DC | PRN

## 2018-06-29 MED ORDER — SERTRALINE HCL 100 MG PO TABS: 100.0000 mg | ORAL_TABLET | Freq: Every day | ORAL | 3 refills | Status: DC

## 2018-06-29 NOTE — Progress Notes (Signed)
Subjective:  Patient ID: Jacob Benson, male    DOB: Sep 08, 1985, 33 y.o.   MRN: [id number]  Chief Complaint:  Follow-up (anxiety, depression)   HPI: Jacob Benson is a 33 y.o. male presenting on 06/29/2018 for Follow-up (anxiety, depression)   1. GAD (generalized anxiety disorder)  Two week follow up. No improvement in symptoms. Has been taking the vistaril as needed and it does help some. Still having difficulties sleeping due to his racing thoughts.  GAD 7 : Generalized Anxiety Score 06/29/2018 06/15/2018  Nervous, Anxious, on Edge 1 3  Control/stop worrying 3 2  Worry too much - different things 3 3  Trouble relaxing 2 3  Restless 0 0  Easily annoyed or irritable 3 3  Afraid - awful might happen 3 1  Total GAD 7 Score 15 15  Anxiety Difficulty Somewhat difficult Somewhat difficult      2. MDD (major depressive disorder), single episode, moderate (HCC)  Two week follow up. No real improvement in symptoms. Continued depressed mood and anxiety. Taking medications as prescribed without adverse side effects.  Depression screen [facility] 2/9 06/29/2018 06/15/2018 06/15/2018 12/29/2017 12/05/2017  Decreased Interest 0 0 0 0 0  Down, Depressed, Hopeless 0 0 0 0 0  PHQ - 2 Score 0 0 0 0 0  Altered sleeping 3 3 - - -  Tired, decreased energy 1 3 - - -  Change in appetite 0 0 - - -  Feeling bad or failure about yourself  0 0 - - -  Trouble concentrating 2 0 - - -  Moving slowly or fidgety/restless 0 0 - - -  Suicidal thoughts 0 0 - - -  PHQ-9 Score 6 6 - - -  Difficult doing work/chores Somewhat difficult Somewhat difficult - - -     3. Difficulty sleeping  Trazodone seems to make symptoms worse. States he does take this but it is not beneficial.  Has trouble sleeping on a nightly basis.      Relevant past medical, surgical, family, and social history reviewed and updated as indicated.  Allergies and medications reviewed and updated.   Past Medical History:  Diagnosis Date    . Calcium kidney stones 2007  . Hyperlipidemia   . Hypertension   . Olecranon fracture    left    Past Surgical History:  Procedure Laterality Date  . FRACTURE SURGERY    . I&D EXTREMITY Left 09/13/2015   Procedure: IRRIGATION AND DEBRIDEMENT EXTREMITY;  Surgeon: Leandrew Koyanagi, MD;  Location: [place];  Service: Orthopedics;  Laterality: Left;  . ORIF ELBOW FRACTURE Left 10/30/2015   Procedure: OPEN REDUCTION INTERNAL FIXATION (ORIF) LEFT OLECRANON FRACTURE;  Surgeon: Leandrew Koyanagi, MD;  Location: [place];  Service: Orthopedics;  Laterality: Left;  . ORIF HUMERUS FRACTURE Left 09/13/2015   Procedure: OPEN REDUCTION INTERNAL FIXATION (ORIF) ELBOW/OLECRANON AND I AND D;  Surgeon: Leandrew Koyanagi, MD;  Location: [place];  Service: Orthopedics;  Laterality: Left;  . TONSILLECTOMY      Social History   Socioeconomic History  . Marital status: Married    Spouse name: Not on file  . Number of children: Not on file  . Years of education: Not on file  . Highest education level: Not on file  Occupational History  . Not on file  Social Needs  . Financial resource strain: Not on file  . Food insecurity:    Worry: Not on file    Inability: Not  on file  . Transportation needs:    Medical: Not on file    Non-medical: Not on file  Tobacco Use  . Smoking status: Former Smoker    Packs/day: 0.50    Types: Cigarettes    Last attempt to quit: 05/23/2013    Years since quitting: 5.1  . Smokeless tobacco: Former Systems developer    Types: Snuff  Substance and Sexual Activity  . Alcohol use: No    Alcohol/week: 0.0 standard drinks  . Drug use: No  . Sexual activity: Not on file  Lifestyle  . Physical activity:    Days per week: Not on file    Minutes per session: Not on file  . Stress: Not on file  Relationships  . Social connections:    Talks on phone: Not on file    Gets together: Not on file    Attends religious service: Not on file    Active member of club or organization: Not on file    Attends  meetings of clubs or organizations: Not on file    Relationship status: Not on file  . Intimate partner violence:    Fear of current or ex partner: Not on file    Emotionally abused: Not on file    Physically abused: Not on file    Forced sexual activity: Not on file  Other Topics Concern  . Not on file  Social History Narrative  . Not on file    Outpatient Encounter Medications as of 06/29/2018  Medication Sig  . albuterol (PROVENTIL HFA;VENTOLIN HFA) 108 (90 Base) MCG/ACT inhaler Inhale 2 puffs into the lungs every 6 (six) hours as needed for wheezing or shortness of breath.  Marland Kitchen amLODipine (NORVASC) 5 MG tablet Take 1 tablet (5 mg total) by mouth daily.  Marland Kitchen esomeprazole (NEXIUM) 40 MG capsule Take 1 capsule (40 mg total) by mouth daily.  . hydrochlorothiazide (MICROZIDE) 12.5 MG capsule Take 1 capsule (12.5 mg total) by mouth daily.  . hydrOXYzine (VISTARIL) 25 MG capsule Take 1 capsule (25 mg total) by mouth 3 (three) times daily as needed.  . montelukast (SINGULAIR) 10 MG tablet TAKE 1 TABLET BY MOUTH AT BEDTIME  . sertraline (ZOLOFT) 100 MG tablet Take 1 tablet (100 mg total) by mouth daily.  . Suvorexant (BELSOMRA) 10 MG TABS Take 10 mg by mouth at bedtime as needed.  . [DISCONTINUED] hydrOXYzine (VISTARIL) 25 MG capsule Take 1 capsule (25 mg total) by mouth 3 (three) times daily as needed.  . [DISCONTINUED] sertraline (ZOLOFT) 50 MG tablet Take 1 tablet (50 mg total) by mouth daily.  . [DISCONTINUED] traZODone (DESYREL) 50 MG tablet Take 0.5-1 tablets (25-50 mg total) by mouth at bedtime as needed for sleep.   No facility-administered encounter medications on file as of 06/29/2018.     Allergies  Allergen Reactions  . Guaifenesin & Derivatives Palpitations  . Oxycodone Nausea And Vomiting    Review of Systems  Constitutional: Positive for fatigue. Negative for activity change, appetite change, chills and fever.  Eyes: Negative for photophobia and visual disturbance.    Respiratory: Negative for cough, choking, chest tightness and shortness of breath.   Cardiovascular: Negative for chest pain, palpitations and leg swelling.  Gastrointestinal: Negative for abdominal pain, constipation, diarrhea, nausea and vomiting.  Musculoskeletal: Negative for arthralgias and myalgias.  Neurological: Negative for dizziness, tremors, weakness and headaches.  Psychiatric/Behavioral: Positive for decreased concentration, dysphoric mood and sleep disturbance. Negative for agitation, behavioral problems, confusion, hallucinations, self-injury and suicidal ideas. The  patient is nervous/anxious. The patient is not hyperactive.   All other systems reviewed and are negative.       Objective:  BP 127/67   Pulse 70   Temp 98.4 F (36.9 C) (Oral)   Ht 5' 10"  (1.778 m)   Wt 184 lb 9.6 oz (83.7 kg)   BMI 26.49 kg/m    Wt Readings from Last 3 Encounters:  06/29/18 184 lb 9.6 oz (83.7 kg)  06/15/18 186 lb (84.4 kg)  12/29/17 174 lb 6.4 oz (79.1 kg)    Physical Exam Vitals signs and nursing note reviewed.  Constitutional:      General: He is not in acute distress.    Appearance: Normal appearance. He is not ill-appearing or toxic-appearing.  HENT:     Head: Normocephalic and atraumatic.     Nose: Nose normal.     Mouth/Throat:     Mouth: Mucous membranes are moist.     Pharynx: Oropharynx is clear.  Eyes:     Conjunctiva/sclera: Conjunctivae normal.     Pupils: Pupils are equal, round, and reactive to light.  Cardiovascular:     Rate and Rhythm: Normal rate and regular rhythm.     Heart sounds: Normal heart sounds.  Pulmonary:     Effort: Pulmonary effort is normal.     Breath sounds: Normal breath sounds.  Skin:    General: Skin is warm and dry.     Capillary Refill: Capillary refill takes less than 2 seconds.  Neurological:     General: No focal deficit present.     Mental Status: He is alert and oriented to person, place, and time.  Psychiatric:         Attention and Perception: Attention and perception normal.        Mood and Affect: Mood and affect normal.        Speech: Speech normal.        Behavior: Behavior normal. Behavior is cooperative.        Thought Content: Thought content normal. Thought content is not paranoid or delusional. Thought content does not include homicidal or suicidal ideation. Thought content does not include homicidal or suicidal plan.        Cognition and Memory: Cognition and memory normal.        Judgment: Judgment normal.     Results for orders placed or performed in visit on 06/15/18  CMP14+EGFR  Result Value Ref Range   Glucose 90 65 - 99 mg/dL   BUN 17 6 - 20 mg/dL   Creatinine, Ser 1.24 0.76 - 1.27 mg/dL   GFR calc non Af Amer 77 >59 mL/min/1.73   GFR calc Af Amer 89 >59 mL/min/1.73   BUN/Creatinine Ratio 14 9 - 20   Sodium 142 134 - 144 mmol/L   Potassium 4.1 3.5 - 5.2 mmol/L   Chloride 99 96 - 106 mmol/L   CO2 26 20 - 29 mmol/L   Calcium 9.6 8.7 - 10.2 mg/dL   Total Protein 7.0 6.0 - 8.5 g/dL   Albumin 4.5 4.0 - 5.0 g/dL   Globulin, Total 2.5 1.5 - 4.5 g/dL   Albumin/Globulin Ratio 1.8 1.2 - 2.2   Bilirubin Total 0.6 0.0 - 1.2 mg/dL   Alkaline Phosphatase 80 39 - 117 IU/L   AST 14 0 - 40 IU/L   ALT 15 0 - 44 IU/L  CBC with Differential/Platelet  Result Value Ref Range   WBC 7.2 3.4 - 10.8 x10E3/uL   RBC 5.70  4.14 - 5.80 x10E6/uL   Hemoglobin 16.0 13.0 - 17.7 g/dL   Hematocrit 48.2 37.5 - 51.0 %   MCV 85 79 - 97 fL   MCH 28.1 26.6 - 33.0 pg   MCHC 33.2 31.5 - 35.7 g/dL   RDW 13.4 11.6 - 15.4 %   Platelets 296 150 - 450 x10E3/uL   Neutrophils 63 Not Estab. %   Lymphs 26 Not Estab. %   Monocytes 8 Not Estab. %   Eos 2 Not Estab. %   Basos 1 Not Estab. %   Neutrophils Absolute 4.5 1.4 - 7.0 x10E3/uL   Lymphocytes Absolute 1.9 0.7 - 3.1 x10E3/uL   Monocytes Absolute 0.6 0.1 - 0.9 x10E3/uL   EOS (ABSOLUTE) 0.1 0.0 - 0.4 x10E3/uL   Basophils Absolute 0.0 0.0 - 0.2 x10E3/uL   Immature  Granulocytes 0 Not Estab. %   Immature Grans (Abs) 0.0 0.0 - 0.1 x10E3/uL  Lipid panel  Result Value Ref Range   Cholesterol, Total 257 (H) 100 - 199 mg/dL   Triglycerides 120 0 - 149 mg/dL   HDL 40 >39 mg/dL   VLDL Cholesterol Cal 24 5 - 40 mg/dL   LDL Calculated 193 (H) 0 - 99 mg/dL   Comment: Comment    Chol/HDL Ratio 6.4 (H) 0.0 - 5.0 ratio  TSH  Result Value Ref Range   TSH 2.470 0.450 - 4.500 uIU/mL  Microalbumin / creatinine urine ratio  Result Value Ref Range   Creatinine, Urine 103.6 Not Estab. mg/dL   Microalbumin, Urine <3.0 Not Estab. ug/mL   Microalb/Creat Ratio <3 0 - 29 mg/g creat       Pertinent labs & imaging results that were available during my care of the patient were reviewed by me and considered in my medical decision making.  Assessment & Plan:  Ryott was seen today for follow-up.  Diagnoses and all orders for this visit:  GAD (generalized anxiety disorder) Will increase zoloft to 100 mg daily. Reevaluation in 6 weeks. Report any new or worsening symptoms.  -     hydrOXYzine (VISTARIL) 25 MG capsule; Take 1 capsule (25 mg total) by mouth 3 (three) times daily as needed. -     sertraline (ZOLOFT) 100 MG tablet; Take 1 tablet (100 mg total) by mouth daily.  MDD (major depressive disorder), single episode, moderate (HCC) Will increase zoloft to 100 mg daily. Reevaluation in 6 weeks. Report any new or worsening symptoms.  -     sertraline (ZOLOFT) 100 MG tablet; Take 1 tablet (100 mg total) by mouth daily.  Difficulty sleeping Stop trazodone. Will trial Belsomra. Sleep hygiene discussed. Avoid stimulants prior to bedtime and develop a bedtime routine.  -     Suvorexant (BELSOMRA) 10 MG TABS; Take 10 mg by mouth at bedtime as needed.     Continue all other maintenance medications.  Follow up plan: Return in about 6 weeks (around 08/10/2018).  Educational handout given for insomnia  The above assessment and management plan was discussed with the  patient. The patient verbalized understanding of and has agreed to the management plan. Patient is aware to call the clinic if symptoms persist or worsen. Patient is aware when to return to the clinic for a follow-up visit. Patient educated on when it is appropriate to go to the emergency department.   Monia Pouch, FNP-C [facility] [phone]

## 2018-06-29 NOTE — Patient Instructions (Signed)

## 2018-06-30 ENCOUNTER — Other Ambulatory Visit: Payer: Self-pay | Admitting: Family Medicine

## 2018-06-30 DIAGNOSIS — G479 Sleep disorder, unspecified: Secondary | ICD-10-CM

## 2018-06-30 MED ORDER — ZOLPIDEM TARTRATE 5 MG PO TABS: 5.0000 mg | ORAL_TABLET | Freq: Every evening | ORAL | 3 refills | Status: DC | PRN

## 2018-06-30 NOTE — Progress Notes (Signed)
Insurance did not cover Belsomra. Will try Ambien. Do not take vistaril with the ambien.

## 2018-07-10 ENCOUNTER — Other Ambulatory Visit: Payer: Self-pay

## 2018-07-10 MED ORDER — ESOMEPRAZOLE MAGNESIUM 40 MG PO CPDR: 40.0000 mg | DELAYED_RELEASE_CAPSULE | Freq: Every day | ORAL | 1 refills | Status: DC

## 2018-07-10 MED FILL — ESOMEPRAZOLE MAG DR 40 MG C: 40 | 90 days supply | Qty: 90 | Fill #0

## 2018-07-10 MED FILL — SHIPPING COST: 1 days supply | Qty: 1 | Fill #2

## 2018-07-17 ENCOUNTER — Other Ambulatory Visit: Payer: Self-pay | Admitting: Family Medicine

## 2018-07-17 DIAGNOSIS — G479 Sleep disorder, unspecified: Secondary | ICD-10-CM

## 2018-07-17 MED ORDER — ZOLPIDEM TARTRATE 10 MG PO TABS: 10.0000 mg | ORAL_TABLET | Freq: Every evening | ORAL | 1 refills | Status: DC | PRN

## 2018-07-17 NOTE — Progress Notes (Signed)
No improvement of symptoms with 5 mg, will trial 10 mg.

## 2018-07-20 ENCOUNTER — Ambulatory Visit: Payer: No Typology Code available for payment source | Admitting: Family Medicine

## 2018-08-02 ENCOUNTER — Other Ambulatory Visit: Payer: Self-pay

## 2018-08-02 DIAGNOSIS — F411 Generalized anxiety disorder: Secondary | ICD-10-CM

## 2018-08-02 MED ORDER — HYDROXYZINE PAMOATE 25 MG PO CAPS: 25.0000 mg | ORAL_CAPSULE | Freq: Three times a day (TID) | ORAL | 3 refills | Status: DC | PRN

## 2018-08-08 ENCOUNTER — Ambulatory Visit: Payer: No Typology Code available for payment source

## 2018-08-17 ENCOUNTER — Other Ambulatory Visit: Payer: Self-pay | Admitting: *Deleted

## 2018-08-17 DIAGNOSIS — I10 Essential (primary) hypertension: Secondary | ICD-10-CM

## 2018-08-17 MED ORDER — HYDROCHLOROTHIAZIDE 12.5 MG PO CAPS: 12.5000 mg | ORAL_CAPSULE | Freq: Every day | ORAL | 1 refills | Status: DC

## 2018-08-17 MED FILL — HYDROCHLOROTHIAZIDE 12.5 MG: 12.5 | 90 days supply | Qty: 90 | Fill #0

## 2018-08-31 MED FILL — MONTELUKAST SOD 10 MG TAB: 10 | 90 days supply | Qty: 90 | Fill #3

## 2018-09-12 ENCOUNTER — Other Ambulatory Visit: Payer: Self-pay | Admitting: *Deleted

## 2018-09-12 ENCOUNTER — Other Ambulatory Visit: Payer: Self-pay | Admitting: Family Medicine

## 2018-09-12 DIAGNOSIS — I10 Essential (primary) hypertension: Secondary | ICD-10-CM

## 2018-09-12 DIAGNOSIS — F411 Generalized anxiety disorder: Secondary | ICD-10-CM

## 2018-09-12 MED ORDER — AMLODIPINE BESYLATE 5 MG PO TABS: 5.0000 mg | ORAL_TABLET | Freq: Every day | ORAL | 1 refills | Status: DC

## 2018-09-12 MED ORDER — HYDROXYZINE PAMOATE 25 MG PO CAPS: 25.0000 mg | ORAL_CAPSULE | Freq: Three times a day (TID) | ORAL | 3 refills | Status: DC | PRN

## 2018-09-12 MED FILL — AMLODIPINE BESYLATE 5 MG TA: 5 | 90 days supply | Qty: 90 | Fill #0

## 2018-09-15 ENCOUNTER — Other Ambulatory Visit: Payer: Self-pay | Admitting: Family Medicine

## 2018-09-15 DIAGNOSIS — G479 Sleep disorder, unspecified: Secondary | ICD-10-CM

## 2018-09-17 ENCOUNTER — Other Ambulatory Visit: Payer: Self-pay | Admitting: Family Medicine

## 2018-09-17 DIAGNOSIS — G479 Sleep disorder, unspecified: Secondary | ICD-10-CM

## 2018-09-17 MED ORDER — ZOLPIDEM TARTRATE 10 MG PO TABS: 10.0000 mg | ORAL_TABLET | Freq: Every evening | ORAL | 1 refills | Status: DC | PRN

## 2018-09-17 NOTE — Progress Notes (Signed)
Pt sent message stating his Ambien has expired. Will refill. Pt aware he needs to schedule a follow up appointment.

## 2018-09-21 ENCOUNTER — Other Ambulatory Visit: Payer: Self-pay

## 2018-09-21 ENCOUNTER — Encounter: Payer: Self-pay | Admitting: Family Medicine

## 2018-09-21 ENCOUNTER — Ambulatory Visit (INDEPENDENT_AMBULATORY_CARE_PROVIDER_SITE_OTHER): Payer: No Typology Code available for payment source | Admitting: Family Medicine

## 2018-09-21 DIAGNOSIS — F321 Major depressive disorder, single episode, moderate: Secondary | ICD-10-CM | POA: Diagnosis not present

## 2018-09-21 DIAGNOSIS — F5105 Insomnia due to other mental disorder: Secondary | ICD-10-CM | POA: Diagnosis not present

## 2018-09-21 DIAGNOSIS — F409 Phobic anxiety disorder, unspecified: Secondary | ICD-10-CM

## 2018-09-21 DIAGNOSIS — F411 Generalized anxiety disorder: Secondary | ICD-10-CM

## 2018-09-21 MED ORDER — SERTRALINE HCL 100 MG PO TABS: 100.0000 mg | ORAL_TABLET | Freq: Every day | ORAL | 3 refills | Status: DC

## 2018-09-21 MED ORDER — QUETIAPINE FUMARATE 25 MG PO TABS: 25.0000 mg | ORAL_TABLET | Freq: Every day | ORAL | 1 refills | Status: DC

## 2018-09-21 NOTE — Progress Notes (Signed)
Virtual Visit via telephone Note Due to COVID-19, visit is conducted virtually and was requested by patient. This visit type was conducted due to national recommendations for restrictions regarding the COVID-19 Pandemic (e.g. social distancing) in an effort to limit this patient's exposure and mitigate transmission in our community. All issues noted in this document were discussed and addressed.  A physical exam was not performed with this format.   I connected with Jacob Benson on 09/21/18 at 1435 by telephone and verified that I am speaking with the correct person using two identifiers. Jacob Benson is currently located at home and family is currently with them during visit. The provider, Jacob Baars, FNP is located in their office at time of visit.  I discussed the limitations, risks, security and privacy concerns of performing an evaluation and management service by telephone and the availability of in person appointments. I also discussed with the patient that there may be a patient responsible charge related to this service. The patient expressed understanding and agreed to proceed.  Subjective:  Patient ID: Jacob Benson, male    DOB: 08/04/1985, 33 y.o.   MRN: [id number]  Chief Complaint:  Anxiety; Insomnia; and Depression   HPI: Jacob Benson is a 33 y.o. male presenting on 09/21/2018 for Anxiety; Insomnia; and Depression   Pt reports worsening insomnia and anxiety. States he has been taking the Ambien along with Atarax and is still unable to sleep more than 4 hours. States his anxiety seems to be increasing as well. His wife is expecting their second child and he feels this has increased his anxiety.   Anxiety  Presents for follow-up visit. Symptoms include decreased concentration, depressed mood, excessive worry, insomnia, irritability, nervous/anxious behavior and restlessness. Patient reports no chest pain, confusion, dizziness, nausea, palpitations, shortness of  breath or suicidal ideas. Symptoms occur constantly. The severity of symptoms is severe. The quality of sleep is poor. Nighttime awakenings: several.   Compliance with medications is 76-100%.  Insomnia  Primary symptoms: no fragmented sleep, sleep disturbance, difficulty falling asleep, frequent awakening, premature morning awakening, malaise/fatigue.  The current episode started more than one month. The problem occurs nightly. The problem has been gradually worsening since onset. The symptoms are aggravated by anxiety, caffeine, SSRI use, emotional upset, work stress and family issues. How many beverages per day that contain caffeine: 4-5.  Types of beverages you drink: soda, tea and energy drink. Past treatments include medication. The treatment provided mild relief. Typical bedtime:  Shift work-varies.  How long after going to bed to you fall asleep: over an hour.   PMH includes: depression, family stress or anxiety, work related stressors. Prior diagnostic workup includes:  Blood work.  Depression         This is a recurrent problem.  The current episode started more than 1 month ago.   Associated symptoms include decreased concentration, fatigue, insomnia, irritable and restlessness.  Associated symptoms include no myalgias, no headaches and no suicidal ideas.     The symptoms are aggravated by family issues.  Past treatments include SSRIs - Selective serotonin reuptake inhibitors.  Compliance with treatment is good.  Previous treatment provided mild relief.  Past medical history includes anxiety.    Depression screen [facility] 2/9 09/21/2018 06/29/2018 06/15/2018 06/15/2018 12/29/2017  Decreased Interest 1 0 0 0 0  Down, Depressed, Hopeless 1 0 0 0 0  PHQ - 2 Score 2 0 0 0 0  Altered sleeping - -  Tired, decreased energy 3 1 3  - -  Change in appetite 0 0 0 - -  Feeling bad or failure about yourself  0 0 0 - -  Trouble concentrating 2 2 0 - -  Moving slowly or fidgety/restless 3 0 0 - -  Suicidal  thoughts 0 0 0 - -  PHQ-9 Score 13 6 6  - -  Difficult doing work/chores - Somewhat difficult Somewhat difficult - -   GAD 7 : Generalized Anxiety Score 09/21/2018 06/29/2018 06/15/2018  Nervous, Anxious, on Edge 3 1 3   Control/stop worrying 2 3 2   Worry too much - different things 3 3 3   Trouble relaxing 2 2 3   Restless 3 0 0  Easily annoyed or irritable 3 3 3   Afraid - awful might happen 0 3 1  Total GAD 7 Score 16 15 15   Anxiety Difficulty - Somewhat difficult Somewhat difficult      Relevant past medical, surgical, family, and social history reviewed and updated as indicated.  Allergies and medications reviewed and updated.   Past Medical History:  Diagnosis Date  . Calcium kidney stones 2007  . Hyperlipidemia   . Hypertension   . Olecranon fracture    left    Past Surgical History:  Procedure Laterality Date  . FRACTURE SURGERY    . I&D EXTREMITY Left 09/13/2015   Procedure: IRRIGATION AND DEBRIDEMENT EXTREMITY;  Surgeon: Tarry Kos, MD;  Location: [facility] OR;  Service: Orthopedics;  Laterality: Left;  . ORIF ELBOW FRACTURE Left 10/30/2015   Procedure: OPEN REDUCTION INTERNAL FIXATION (ORIF) LEFT OLECRANON FRACTURE;  Surgeon: Tarry Kos, MD;  Location: [facility] OR;  Service: Orthopedics;  Laterality: Left;  . ORIF HUMERUS FRACTURE Left 09/13/2015   Procedure: OPEN REDUCTION INTERNAL FIXATION (ORIF) ELBOW/OLECRANON AND I AND D;  Surgeon: Tarry Kos, MD;  Location: [facility] OR;  Service: Orthopedics;  Laterality: Left;  . TONSILLECTOMY      Social History   Socioeconomic History  . Marital status: Married    Spouse name: Not on file  . Number of children: Not on file  . Years of education: Not on file  . Highest education level: Not on file  Occupational History  . Not on file  Social Needs  . Financial resource strain: Not on file  . Food insecurity:    Worry: Not on file    Inability: Not on file  . Transportation needs:    Medical: Not on file    Non-medical: Not on  file  Tobacco Use  . Smoking status: Former Smoker    Packs/day: 0.50    Types: Cigarettes    Last attempt to quit: 05/23/2013    Years since quitting: 5.3  . Smokeless tobacco: Former Neurosurgeon    Types: Snuff  Substance and Sexual Activity  . Alcohol use: No    Alcohol/week: 0.0 standard drinks  . Drug use: No  . Sexual activity: Not on file  Lifestyle  . Physical activity:    Days per week: Not on file    Minutes per session: Not on file  . Stress: Not on file  Relationships  . Social connections:    Talks on phone: Not on file    Gets together: Not on file    Attends religious service: Not on file    Active member of club or organization: Not on file    Attends meetings of clubs or organizations: Not on file    Relationship status: Not on file  .  Intimate partner violence:    Fear of current or ex partner: Not on file    Emotionally abused: Not on file    Physically abused: Not on file    Forced sexual activity: Not on file  Other Topics Concern  . Not on file  Social History Narrative  . Not on file    Outpatient Encounter Medications as of 09/21/2018  Medication Sig  . albuterol (PROVENTIL HFA;VENTOLIN HFA) 108 (90 Base) MCG/ACT inhaler Inhale 2 puffs into the lungs every 6 (six) hours as needed for wheezing or shortness of breath.  Marland Kitchen amLODipine (NORVASC) 5 MG tablet TAKE 1 TABLET BY MOUTH DAILY.  Marland Kitchen amLODipine (NORVASC) 5 MG tablet Take 1 tablet (5 mg total) by mouth daily.  Marland Kitchen esomeprazole (NEXIUM) 40 MG capsule Take 1 capsule (40 mg total) by mouth daily.  . hydrochlorothiazide (MICROZIDE) 12.5 MG capsule Take 1 capsule (12.5 mg total) by mouth daily.  . montelukast (SINGULAIR) 10 MG tablet TAKE 1 TABLET BY MOUTH AT BEDTIME  . QUEtiapine (SEROQUEL) 25 MG tablet Take 1 tablet (25 mg total) by mouth at bedtime for 30 days.  Marland Kitchen sertraline (ZOLOFT) 100 MG tablet Take 1 tablet (100 mg total) by mouth daily.  Marland Kitchen zolpidem (AMBIEN) 10 MG tablet Take 1 tablet (10 mg total) by mouth  at bedtime as needed for up to 30 days for sleep.  . [DISCONTINUED] hydrOXYzine (VISTARIL) 25 MG capsule Take 1 capsule (25 mg total) by mouth 3 (three) times daily as needed for up to 30 days.  . [DISCONTINUED] sertraline (ZOLOFT) 100 MG tablet Take 1 tablet (100 mg total) by mouth daily.   No facility-administered encounter medications on file as of 09/21/2018.     Allergies  Allergen Reactions  . Guaifenesin & Derivatives Palpitations  . Oxycodone Nausea And Vomiting    Review of Systems  Constitutional: Positive for fatigue, irritability and malaise/fatigue. Negative for chills, fever and unexpected weight change.  Respiratory: Negative for chest tightness and shortness of breath.   Cardiovascular: Negative for chest pain, palpitations and leg swelling.  Gastrointestinal: Negative for abdominal pain, constipation, diarrhea, nausea and vomiting.  Genitourinary: Negative for decreased urine volume and difficulty urinating.  Musculoskeletal: Negative for arthralgias and myalgias.  Neurological: Negative for dizziness, syncope, light-headedness and headaches.  Psychiatric/Behavioral: Positive for agitation, decreased concentration, depression, dysphoric mood and sleep disturbance. Negative for confusion, hallucinations, self-injury and suicidal ideas. The patient is nervous/anxious and has insomnia. The patient is not hyperactive.   All other systems reviewed and are negative.        Observations/Objective: No vital signs or physical exam, this was a telephone or virtual health encounter.  Pt alert and oriented, answers all questions appropriately, and able to speak in full sentences.    Assessment and Plan: Jacob Benson was seen today for anxiety, insomnia and depression.  Diagnoses and all orders for this visit:  GAD (generalized anxiety disorder) Will stop the atarax and trial Seroquel 25 mg at night. Report any new or worsening symptoms. Follow up in one month. -     QUEtiapine  (SEROQUEL) 25 MG tablet; Take 1 tablet (25 mg total) by mouth at bedtime for 30 days. -     sertraline (ZOLOFT) 100 MG tablet; Take 1 tablet (100 mg total) by mouth daily.  Insomnia due to anxiety and fear Stop atarax. Will trial Seroquel. Report any new or worsening symptoms. Monitor for respiratory depression. If no improvement, will refer for sleep study. -  QUEtiapine (SEROQUEL) 25 MG tablet; Take 1 tablet (25 mg total) by mouth at bedtime for 30 days.  MDD (major depressive disorder), single episode, moderate (HCC) Will trial Seroquel in addition to Zoloft. Report any new or worsening symptoms. Follow up in 1 month.  -     QUEtiapine (SEROQUEL) 25 MG tablet; Take 1 tablet (25 mg total) by mouth at bedtime for 30 days. -     sertraline (ZOLOFT) 100 MG tablet; Take 1 tablet (100 mg total) by mouth daily.     Follow Up Instructions: Return in about 1 month (around 10/22/2018), or if symptoms worsen or fail to improve, for insomnia, anxiety.    I discussed the assessment and treatment plan with the patient. The patient was provided an opportunity to ask questions and all were answered. The patient agreed with the plan and demonstrated an understanding of the instructions.   The patient was advised to call back or seek an in-person evaluation if the symptoms worsen or if the condition fails to improve as anticipated.  The above assessment and management plan was discussed with the patient. The patient verbalized understanding of and has agreed to the management plan. Patient is aware to call the clinic if symptoms persist or worsen. Patient is aware when to return to the clinic for a follow-up visit. Patient educated on when it is appropriate to go to the emergency department.    I provided 15 minutes of non-face-to-face time during this encounter. The call started at 1435. The call ended at 1450. The other time was used for coordination of care.    Jacob BaarsMichelle Lennis Korb, FNP-C [facility] [address] [place], [place] [address] [phone]

## 2018-09-21 NOTE — Patient Instructions (Signed)
Stop atarax. Start Seroquel.

## 2018-10-12 MED FILL — ESOMEPRAZOLE MAG DR 40 MG C: 40 | 90 days supply | Qty: 90 | Fill #1

## 2018-10-30 ENCOUNTER — Other Ambulatory Visit: Payer: Self-pay | Admitting: *Deleted

## 2018-10-30 ENCOUNTER — Ambulatory Visit (INDEPENDENT_AMBULATORY_CARE_PROVIDER_SITE_OTHER): Payer: No Typology Code available for payment source | Admitting: Family

## 2018-10-30 ENCOUNTER — Encounter: Payer: Self-pay | Admitting: Family

## 2018-10-30 ENCOUNTER — Other Ambulatory Visit: Payer: Self-pay

## 2018-10-30 ENCOUNTER — Ambulatory Visit (INDEPENDENT_AMBULATORY_CARE_PROVIDER_SITE_OTHER): Payer: No Typology Code available for payment source

## 2018-10-30 VITALS — BP 134/80 | HR 67 | Temp 97.4°F | Ht 70.0 in | Wt 185.0 lb

## 2018-10-30 DIAGNOSIS — S63610A Unspecified sprain of right index finger, initial encounter: Secondary | ICD-10-CM | POA: Diagnosis not present

## 2018-10-30 DIAGNOSIS — M79641 Pain in right hand: Secondary | ICD-10-CM | POA: Diagnosis not present

## 2018-10-30 MED ORDER — DICLOFENAC SODIUM 75 MG PO TBEC: 75.0000 mg | DELAYED_RELEASE_TABLET | Freq: Two times a day (BID) | ORAL | 0 refills | Status: DC

## 2018-10-30 NOTE — Patient Instructions (Signed)
Finger Sprain, Adult  A finger sprain is a tear or stretch in a ligament in a finger. Ligaments are tissues that connect bones to each other.  What are the causes?  Finger sprains happen when something makes the bones in the hand move in an abnormal way. They are often caused by a fall or accident.  What increases the risk?  This condition is more likely to develop in people who:  Participate in sports in which it is easy to fall, such as skiing.  Play sports that involve catching an object, such as basketball.  Have poor strength and flexibility.  What are the signs or symptoms?  Symptoms of this condition include:  Pain or tenderness in the finger.  Swelling in the finger.  Bluish appearance to the finger.  Bruising.  Difficulty bending and flexing the finger.  How is this diagnosed?  This condition is diagnosed with an exam of your finger. Your health care provider may do an X-ray to see if any bones are broken or dislocated.  How is this treated?  Treatment for this condition depends on how severe the sprain is. It may involve:  Preventing the finger from moving for a period of time. Your finger may be wrapped in a bandage (dressing), splint, or cast, or your finger may be taped to the fingers beside it (buddy taping).  Keeping the hand raised (elevated) above the level of the heart during rest and sleep.  Medicines for pain.  Exercises to strengthen the finger. These may be recommended when the finger has healed.  Surgery to reconnect the ligament to a bone. This may be done if the ligament was torn all the way.  Follow these instructions at home:  If you have a splint:  Do not put pressure on any part of the splint until it is fully hardened. This may take several hours.  Wear the splint as told by your health care provider. Remove it only as told by your health care provider.  Loosen the splint if your fingers tingle, become numb, or turn cold and blue.  Keep the splint clean.  If the splint is not  waterproof:  Do not let it get wet.  Cover it with a watertight covering when you take a bath or a shower.  If you have a cast:  Do not put pressure on any part of the cast until it is fully hardened. This may take several hours.  Do not stick anything inside the cast to scratch your skin. Doing that increases your risk of infection.  Check the skin around the cast every day. Tell your health care provider about any concerns.  You may put lotion on dry skin around the edges of the cast. Do not put lotion on the skin underneath the cast.  Keep the cast clean.  If the cast is not waterproof:  Do not let it get wet.  Cover it with a watertight covering when you take a bath or shower.  Managing pain, stiffness, and swelling  If directed, put ice on the injured area:  If you have a removable splint, remove it as told by your health care provider.  Put ice in a plastic bag.  Place a towel between your skin and the bag or between your cast and the bag.  Leave the ice on for 20 minutes, 2-3 times a day.  Gently move your fingers often to avoid stiffness and to lessen swelling.  Elevate the injured area above   the level of your heart while you are sitting or lying down.  Medicines  Take over-the-counter and prescription medicines only as told by your health care provider.  Do not drive or use heavy machinery while taking prescription pain medicine.  General instructions  Keep any dressings dry until your health care provider says they can be removed.  Do exercises as told by your health care provider or physical therapist.  Do not wear rings on your injured finger.  Keep all follow-up visits as told by your health care provider. This is important.  Get help right away if:  Your pain is not controlled with medicine.  Your bruising or swelling gets worse.  Your splint or cast is damaged.  Your finger is numb or blue.  Your finger feels colder to the touch than normal.  You develop a fever.  Summary  A finger sprain is a tear or  stretch in a ligament in a finger. Ligaments are tissues that connect bones to each other.  Finger sprains happen when something makes the bones in the hand move in an abnormal way. They are often caused by a fall or accident.  This condition is diagnosed with an exam of your finger. Your health care provider may do an X-ray to see if any bones are broken or dislocated.  Treatment for this condition depends on how severe the sprain is. Treatment may involve wearing a splint or cast. Surgery to reconnect the ligament to a bone may be needed if the ligament was torn all the way.  This information is not intended to replace advice given to you by your health care provider. Make sure you discuss any questions you have with your health care provider.  Document Released: 06/10/2004 Document Revised: 07/23/2016 Document Reviewed: 07/23/2016  Elsevier Interactive Patient Education  2019 [employer]

## 2018-10-30 NOTE — Progress Notes (Signed)
   Subjective:    Patient ID: Jacob Benson, male    DOB: August 30, 1985, 33 y.o.   MRN: [id number]  Chief Complaint  Patient presents with  . right hand pain    Hand Pain  The incident occurred more than 1 week ago. There was no injury mechanism. Pain location: right index finger. The quality of the pain is described as aching. Radiates to: radiated down hand. The pain is at a severity of 2/10 (up to a 7). The pain is mild. The pain has been intermittent since the incident. Pertinent negatives include no numbness or tingling. The symptoms are aggravated by movement and lifting. He has tried rest and NSAIDs for the symptoms. The treatment provided mild relief.      Review of Systems  Neurological: Negative for tingling and numbness.  All other systems reviewed and are negative.      Objective:   Physical Exam Vitals signs reviewed.  Constitutional:      General: He is not in acute distress.    Appearance: He is well-developed.  HENT:     Head: Normocephalic.     Right Ear: Tympanic membrane normal.     Left Ear: Tympanic membrane normal.  Eyes:     General:        Right eye: No discharge.        Left eye: No discharge.     Pupils: Pupils are equal, round, and reactive to light.  Neck:     Musculoskeletal: Normal range of motion and neck supple.     Thyroid: No thyromegaly.  Cardiovascular:     Rate and Rhythm: Normal rate and regular rhythm.     Heart sounds: Normal heart sounds. No murmur.  Pulmonary:     Effort: Pulmonary effort is normal. No respiratory distress.     Breath sounds: Normal breath sounds. No wheezing.  Abdominal:     General: Bowel sounds are normal. There is no distension.     Palpations: Abdomen is soft.     Tenderness: There is no abdominal tenderness.  Musculoskeletal:        General: Tenderness present.     Comments: Mild tenderness in left index knuckle  Skin:    General: Skin is warm and dry.     Findings: No erythema or rash.   Neurological:     Mental Status: He is alert and oriented to person, place, and time.     Cranial Nerves: No cranial nerve deficit.     Deep Tendon Reflexes: Reflexes are normal and symmetric.  Psychiatric:        Behavior: Behavior normal.        Thought Content: Thought content normal.        Judgment: Judgment normal.       BP 134/80   Pulse 67   Temp (!) 97.4 F (36.3 C) (Oral)   Ht 5\' 10"  (1.778 m)   Wt 185 lb (83.9 kg)   BMI 26.54 kg/m      Assessment & Plan:  DEMETRI GOSHERT comes in today with chief complaint of right hand pain   Diagnosis and orders addressed:  1. Sprain of right index finger, unspecified site of finger, initial encounter Rest Ice ROM exercises encouraged RTO if symptoms worsen or do not improve - diclofenac (VOLTAREN) 75 MG EC tablet; Take 1 tablet (75 mg total) by mouth 2 (two) times daily.  Dispense: 30 tablet; Refill: 0   Evelina Dun, FNP

## 2018-11-07 ENCOUNTER — Other Ambulatory Visit: Payer: Self-pay | Admitting: *Deleted

## 2018-11-07 DIAGNOSIS — R112 Nausea with vomiting, unspecified: Secondary | ICD-10-CM

## 2018-11-07 MED ORDER — ONDANSETRON 4 MG PO TBDP: 4.0000 mg | ORAL_TABLET | Freq: Three times a day (TID) | ORAL | 0 refills | Status: DC | PRN

## 2018-11-15 MED FILL — HYDROCHLOROTHIAZIDE 12.5 MG: 12.5 | 90 days supply | Qty: 90 | Fill #1

## 2018-11-16 ENCOUNTER — Other Ambulatory Visit: Payer: Self-pay | Admitting: Family Medicine

## 2018-11-16 DIAGNOSIS — G479 Sleep disorder, unspecified: Secondary | ICD-10-CM

## 2018-11-20 ENCOUNTER — Other Ambulatory Visit: Payer: Self-pay | Admitting: Family Medicine

## 2018-11-20 DIAGNOSIS — F409 Phobic anxiety disorder, unspecified: Secondary | ICD-10-CM

## 2018-11-20 DIAGNOSIS — F411 Generalized anxiety disorder: Secondary | ICD-10-CM

## 2018-11-20 DIAGNOSIS — F321 Major depressive disorder, single episode, moderate: Secondary | ICD-10-CM

## 2018-11-21 NOTE — Telephone Encounter (Signed)
Last seen 09/21/18

## 2018-11-29 ENCOUNTER — Other Ambulatory Visit: Payer: Self-pay

## 2018-11-29 MED ORDER — PREDNISONE 20 MG PO TABS: 40.0000 mg | ORAL_TABLET | Freq: Every day | ORAL | 0 refills | Status: DC

## 2018-11-29 MED ORDER — TRIAMCINOLONE ACETONIDE 0.1 % EX CREA: 1.0000 "application " | TOPICAL_CREAM | Freq: Two times a day (BID) | CUTANEOUS | 0 refills | Status: DC

## 2018-12-12 ENCOUNTER — Other Ambulatory Visit: Payer: Self-pay | Admitting: Family Medicine

## 2018-12-12 MED FILL — MONTELUKAST SOD 10 MG TAB: 10 | 90 days supply | Qty: 90 | Fill #0

## 2018-12-12 MED FILL — AMLODIPINE BESYLATE 5 MG TA: 5 | 90 days supply | Qty: 90 | Fill #0

## 2018-12-16 ENCOUNTER — Other Ambulatory Visit: Payer: Self-pay | Admitting: Family Medicine

## 2018-12-16 DIAGNOSIS — G479 Sleep disorder, unspecified: Secondary | ICD-10-CM

## 2018-12-16 DIAGNOSIS — F411 Generalized anxiety disorder: Secondary | ICD-10-CM

## 2018-12-16 DIAGNOSIS — F409 Phobic anxiety disorder, unspecified: Secondary | ICD-10-CM

## 2018-12-16 DIAGNOSIS — F321 Major depressive disorder, single episode, moderate: Secondary | ICD-10-CM

## 2018-12-17 ENCOUNTER — Other Ambulatory Visit: Payer: Self-pay | Admitting: Nurse Practitioner

## 2018-12-17 DIAGNOSIS — G479 Sleep disorder, unspecified: Secondary | ICD-10-CM

## 2018-12-17 MED ORDER — ZOLPIDEM TARTRATE 10 MG PO TABS: ORAL_TABLET | ORAL | 0 refills | Status: DC

## 2018-12-18 MED ORDER — QUETIAPINE FUMARATE 25 MG PO TABS: 25.0000 mg | ORAL_TABLET | Freq: Every day | ORAL | 0 refills | Status: DC

## 2018-12-18 NOTE — Telephone Encounter (Signed)
Wife states needs refill on seroquel

## 2019-01-09 ENCOUNTER — Other Ambulatory Visit: Payer: Self-pay

## 2019-01-09 DIAGNOSIS — F321 Major depressive disorder, single episode, moderate: Secondary | ICD-10-CM

## 2019-01-09 DIAGNOSIS — F411 Generalized anxiety disorder: Secondary | ICD-10-CM

## 2019-01-09 MED ORDER — SERTRALINE HCL 100 MG PO TABS: 100.0000 mg | ORAL_TABLET | Freq: Every day | ORAL | 1 refills | Status: DC

## 2019-01-09 MED FILL — SERTRALINE HCL 100 MG TAB: 100 | 90 days supply | Qty: 90 | Fill #0

## 2019-01-15 ENCOUNTER — Other Ambulatory Visit: Payer: Self-pay | Admitting: Family Medicine

## 2019-01-15 ENCOUNTER — Other Ambulatory Visit: Payer: Self-pay | Admitting: *Deleted

## 2019-01-15 DIAGNOSIS — G479 Sleep disorder, unspecified: Secondary | ICD-10-CM

## 2019-01-15 MED ORDER — ZOLPIDEM TARTRATE 10 MG PO TABS: ORAL_TABLET | ORAL | 0 refills | Status: DC

## 2019-01-15 MED FILL — ESOMEPRAZOLE MAG DR 40 MG C: 40 | 90 days supply | Qty: 90 | Fill #0

## 2019-01-19 ENCOUNTER — Other Ambulatory Visit: Payer: Self-pay | Admitting: *Deleted

## 2019-01-19 DIAGNOSIS — F321 Major depressive disorder, single episode, moderate: Secondary | ICD-10-CM

## 2019-01-19 DIAGNOSIS — F411 Generalized anxiety disorder: Secondary | ICD-10-CM

## 2019-01-19 DIAGNOSIS — F409 Phobic anxiety disorder, unspecified: Secondary | ICD-10-CM

## 2019-01-19 DIAGNOSIS — F5105 Insomnia due to other mental disorder: Secondary | ICD-10-CM

## 2019-01-19 MED ORDER — QUETIAPINE FUMARATE 25 MG PO TABS: 25.0000 mg | ORAL_TABLET | Freq: Every day | ORAL | 0 refills | Status: DC

## 2019-02-07 ENCOUNTER — Other Ambulatory Visit: Payer: Self-pay

## 2019-02-08 ENCOUNTER — Ambulatory Visit (INDEPENDENT_AMBULATORY_CARE_PROVIDER_SITE_OTHER): Payer: No Typology Code available for payment source | Admitting: Family Medicine

## 2019-02-08 ENCOUNTER — Encounter: Payer: Self-pay | Admitting: Family Medicine

## 2019-02-08 VITALS — BP 134/85 | HR 66 | Temp 97.7°F | Resp 20 | Ht 70.0 in | Wt 188.0 lb

## 2019-02-08 DIAGNOSIS — I10 Essential (primary) hypertension: Secondary | ICD-10-CM

## 2019-02-08 DIAGNOSIS — F5105 Insomnia due to other mental disorder: Secondary | ICD-10-CM | POA: Diagnosis not present

## 2019-02-08 DIAGNOSIS — F409 Phobic anxiety disorder, unspecified: Secondary | ICD-10-CM

## 2019-02-08 DIAGNOSIS — F411 Generalized anxiety disorder: Secondary | ICD-10-CM

## 2019-02-08 DIAGNOSIS — F321 Major depressive disorder, single episode, moderate: Secondary | ICD-10-CM | POA: Diagnosis not present

## 2019-02-08 DIAGNOSIS — Z79899 Other long term (current) drug therapy: Secondary | ICD-10-CM | POA: Insufficient documentation

## 2019-02-08 MED ORDER — HYDROCHLOROTHIAZIDE 12.5 MG PO CAPS: 12.5000 mg | ORAL_CAPSULE | Freq: Every day | ORAL | 1 refills | Status: DC

## 2019-02-08 MED ORDER — QUETIAPINE FUMARATE 50 MG PO TABS: 50.0000 mg | ORAL_TABLET | Freq: Every day | ORAL | 1 refills | Status: DC

## 2019-02-08 MED ORDER — ZOLPIDEM TARTRATE 10 MG PO TABS: ORAL_TABLET | ORAL | 0 refills | Status: DC

## 2019-02-08 MED ORDER — SERTRALINE HCL 100 MG PO TABS: 200.0000 mg | ORAL_TABLET | Freq: Every day | ORAL | 1 refills | Status: DC

## 2019-02-08 MED ORDER — AMLODIPINE BESYLATE 5 MG PO TABS: 5.0000 mg | ORAL_TABLET | Freq: Every day | ORAL | 1 refills | Status: DC

## 2019-02-08 MED ORDER — HYDROXYZINE PAMOATE 50 MG PO CAPS: 50.0000 mg | ORAL_CAPSULE | Freq: Three times a day (TID) | ORAL | 1 refills | Status: DC | PRN

## 2019-02-08 NOTE — Progress Notes (Signed)
Subjective:  Patient ID: Jacob Benson, male    DOB: 07/04/1985, 33 y.o.   MRN: [id number]  Patient Care Team: Jacob Gouty, FNP as PCP - General (Family Medicine) Jacob Benson, Jacob Burkitt, FNP as Nurse Practitioner (Family Medicine)   Chief Complaint:  Medical Management of Chronic Issues and Hypertension   HPI: Jacob Benson is a 33 y.o. male presenting on 02/08/2019 for Medical Management of Chronic Issues and Hypertension  1. Essential hypertension  Complaint with meds - Yes Current Medications - amlodipine, Microzide  Checking BP at home - No Exercising Regularly - No Watching Salt intake - Yes Pertinent ROS:  Headache - No Fatigue - Yes Visual Disturbances - No Chest pain - No Dyspnea - No Palpitations - No LE edema - No They report good compliance with medications and can restate their regimen by memory. No medication side effects.  Family, social, and smoking history reviewed.   BP Readings from Last 3 Encounters:  02/08/19 134/85  10/30/18 134/80  06/29/18 127/67   CMP Latest Ref Rng & Units 06/15/2018 12/29/2017 11/11/2017  Glucose 65 - 99 mg/dL 90 74 78  BUN 6 - 20 mg/dL 17 17 22(H)  Creatinine 0.76 - 1.27 mg/dL 1.24 1.22 1.21  Sodium 134 - 144 mmol/L 142 139 141  Potassium 3.5 - 5.2 mmol/L 4.1 3.6 4.1  Chloride 96 - 106 mmol/L 99 96 100  CO2 20 - 29 mmol/L 26 27 28   Calcium 8.7 - 10.2 mg/dL 9.6 9.2 9.2  Total Protein 6.0 - 8.5 g/dL 7.0 - 6.6  Total Bilirubin 0.0 - 1.2 mg/dL 0.6 - 0.3  Alkaline Phos 39 - 117 IU/L 80 - 66  AST 0 - 40 IU/L 14 - 16  ALT 0 - 44 IU/L 15 - 19      2. GAD (generalized anxiety disorder)  Continue anxiety and feelings of panic at times. States he takes the Vistaril as needed for severe episodes but feels it is not beneficial. States he feels more anxious when he is at work alone. States he has noticed a twitching of his hands when he is anxious and trying to complete a task. States this seems to be worse when his anxiety  increases. States he is very irritable and can get angry over the littlest things. States he has noticed a slight improvement in his symptoms but feels they are not very controlled. States if he stops and takes a deep breath the anxiety and hand twitching will stop. He reports mood swings and feeling angry. He is compliant with his medications and avoid stimulant use. He denies SI or HI. Feels his anxiety is interfering with his work and family relationships.  GAD 7 : Generalized Anxiety Score 02/08/2019 09/21/2018 06/29/2018 06/15/2018  Nervous, Anxious, on Edge 3 3 1 3   Control/stop worrying 3 2 3 2   Worry too much - different things 3 3 3 3   Trouble relaxing 2 2 2 3   Restless 3 3 0 0  Easily annoyed or irritable 3 3 3 3   Afraid - awful might happen 2 0 3 1  Total GAD 7 Score 19 16 15 15   Anxiety Difficulty Somewhat difficult - Somewhat difficult Somewhat difficult      3. MDD (major depressive disorder), single episode, moderate (HCC)  Ongoing symptoms. Continued fatigue, lack of interest, altered sleeping, and trouble concentrating. Feels the anxiety is more prominent than the depression. He denies SI or HI.  Depression screen [facility] 2/9 02/08/2019  10/30/2018 09/21/2018 06/29/2018 06/15/2018  Decreased Interest 1 0 1 0 0  Down, Depressed, Hopeless 0 0 1 0 0  PHQ - 2 Score 1 0 2 0 0  Altered sleeping 2 - 3 3 3   Tired, decreased energy 2 - 3 1 3   Change in appetite 0 - 0 0 0  Feeling bad or failure about yourself  0 - 0 0 0  Trouble concentrating 1 - 2 2 0  Moving slowly or fidgety/restless 2 - 3 0 0  Suicidal thoughts 0 - 0 0 0  PHQ-9 Score 8 - 13 6 6   Difficult doing work/chores Somewhat difficult - - Somewhat difficult Somewhat difficult     4. Insomnia due to anxiety and fear  Has been taking Ambien and Seroquel nightly and still has trouble falling asleep and staying asleep. Pt states he has racing thoughts that keep him awake and then wake him up at night. Pt denies sleep apnea symptoms. No  stimulant use.     Relevant past medical, surgical, family, and social history reviewed and updated as indicated.  Allergies and medications reviewed and updated. Date reviewed: Chart in Epic.   Past Medical History:  Diagnosis Date   Calcium kidney stones 2007   Hyperlipidemia    Hypertension    Olecranon fracture    left    Past Surgical History:  Procedure Laterality Date   FRACTURE SURGERY     I&D EXTREMITY Left 09/13/2015   Procedure: IRRIGATION AND DEBRIDEMENT EXTREMITY;  Surgeon: Leandrew Koyanagi, MD;  Location: [place];  Service: Orthopedics;  Laterality: Left;   ORIF ELBOW FRACTURE Left 10/30/2015   Procedure: OPEN REDUCTION INTERNAL FIXATION (ORIF) LEFT OLECRANON FRACTURE;  Surgeon: Leandrew Koyanagi, MD;  Location: [place];  Service: Orthopedics;  Laterality: Left;   ORIF HUMERUS FRACTURE Left 09/13/2015   Procedure: OPEN REDUCTION INTERNAL FIXATION (ORIF) ELBOW/OLECRANON AND I AND D;  Surgeon: Leandrew Koyanagi, MD;  Location: [place];  Service: Orthopedics;  Laterality: Left;   TONSILLECTOMY      Social History   Socioeconomic History   Marital status: Married    Spouse name: Not on file   Number of children: Not on file   Years of education: Not on file   Highest education level: Not on file  Occupational History   Not on file  Social Needs   Financial resource strain: Not on file   Food insecurity    Worry: Not on file    Inability: Not on file   Transportation needs    Medical: Not on file    Non-medical: Not on file  Tobacco Use   Smoking status: Former Smoker    Packs/day: 0.50    Types: Cigarettes    Quit date: 05/23/2013    Years since quitting: 5.7   Smokeless tobacco: Former Systems developer    Types: Snuff  Substance and Sexual Activity   Alcohol use: No    Alcohol/week: 0.0 standard drinks   Drug use: No   Sexual activity: Not on file  Lifestyle   Physical activity    Days per week: Not on file    Minutes per session: Not on file   Stress:  Not on file  Relationships   Social connections    Talks on phone: Not on file    Gets together: Not on file    Attends religious service: Not on file    Active member of club or organization: Not on file  Attends meetings of clubs or organizations: Not on file    Relationship status: Not on file   Intimate partner violence    Fear of current or ex partner: Not on file    Emotionally abused: Not on file    Physically abused: Not on file    Forced sexual activity: Not on file  Other Topics Concern   Not on file  Social History Narrative   Not on file    Outpatient Encounter Medications as of 02/08/2019  Medication Sig   amLODipine (NORVASC) 5 MG tablet Take 1 tablet (5 mg total) by mouth daily.   esomeprazole (NEXIUM) 40 MG capsule TAKE 1 CAPSULE (40 MG TOTAL) BY MOUTH DAILY.   hydrochlorothiazide (MICROZIDE) 12.5 MG capsule Take 1 capsule (12.5 mg total) by mouth daily.   montelukast (SINGULAIR) 10 MG tablet TAKE 1 TABLET BY MOUTH AT BEDTIME   zolpidem (AMBIEN) 10 MG tablet TAKE 1 TABLET BY MOUTH AT BEDTIME AS NEEDED FOR SLEEP FOR UP TO 30 DAYS   [DISCONTINUED] amLODipine (NORVASC) 5 MG tablet TAKE 1 TABLET BY MOUTH DAILY.   [DISCONTINUED] hydrochlorothiazide (MICROZIDE) 12.5 MG capsule Take 1 capsule (12.5 mg total) by mouth daily.   [DISCONTINUED] QUEtiapine (SEROQUEL) 25 MG tablet Take 1 tablet (25 mg total) by mouth at bedtime.   [DISCONTINUED] sertraline (ZOLOFT) 100 MG tablet Take 1 tablet (100 mg total) by mouth daily.   [DISCONTINUED] zolpidem (AMBIEN) 10 MG tablet TAKE 1 TABLET BY MOUTH AT BEDTIME AS NEEDED FOR SLEEP FOR UP TO 30 DAYS   hydrOXYzine (VISTARIL) 50 MG capsule Take 1 capsule (50 mg total) by mouth 3 (three) times daily as needed for anxiety.   QUEtiapine (SEROQUEL) 50 MG tablet Take 1 tablet (50 mg total) by mouth at bedtime.   sertraline (ZOLOFT) 100 MG tablet Take 2 tablets (200 mg total) by mouth daily.   [DISCONTINUED] albuterol  (PROVENTIL HFA;VENTOLIN HFA) 108 (90 Base) MCG/ACT inhaler Inhale 2 puffs into the lungs every 6 (six) hours as needed for wheezing or shortness of breath.   [DISCONTINUED] diclofenac (VOLTAREN) 75 MG EC tablet Take 1 tablet (75 mg total) by mouth 2 (two) times daily.   [DISCONTINUED] hydrOXYzine (VISTARIL) 25 MG capsule TAKE 1 CAPSULE BY MOUTH THREE TIMES DAILY AS NEEDED FOR UP TO 30 DAYS   [DISCONTINUED] ondansetron (ZOFRAN ODT) 4 MG disintegrating tablet Take 1 tablet (4 mg total) by mouth every 8 (eight) hours as needed for nausea or vomiting.   [DISCONTINUED] predniSONE (DELTASONE) 20 MG tablet Take 2 tablets (40 mg total) by mouth daily with breakfast.   [DISCONTINUED] triamcinolone cream (KENALOG) 0.1 % Apply 1 application topically 2 (two) times daily.   No facility-administered encounter medications on file as of 02/08/2019.     Allergies  Allergen Reactions   Guaifenesin & Derivatives Palpitations   Oxycodone Nausea And Vomiting    Review of Systems  Constitutional: Positive for fatigue. Negative for activity change, appetite change, chills, diaphoresis, fever and unexpected weight change.  HENT: Negative.   Eyes: Negative.  Negative for photophobia and visual disturbance.  Respiratory: Negative for cough, chest tightness and shortness of breath.   Cardiovascular: Negative for chest pain, palpitations and leg swelling.  Gastrointestinal: Negative for abdominal pain, blood in stool, constipation, diarrhea, nausea and vomiting.  Endocrine: Negative.  Negative for polydipsia, polyphagia and polyuria.  Genitourinary: Negative for decreased urine volume, difficulty urinating, dysuria, frequency and urgency.  Musculoskeletal: Negative for arthralgias and myalgias.  Skin: Negative.  Negative for color  change, pallor, rash and wound.  Allergic/Immunologic: Negative.   Neurological: Positive for tremors. Negative for dizziness, seizures, syncope, facial asymmetry, speech  difficulty, weakness, light-headedness, numbness and headaches.  Hematological: Negative.   Psychiatric/Behavioral: Positive for agitation, behavioral problems, decreased concentration, dysphoric mood and sleep disturbance. Negative for confusion, hallucinations, self-injury and suicidal ideas. The patient is nervous/anxious. The patient is not hyperactive.   All other systems reviewed and are negative.       Objective:  BP 134/85    Pulse 66    Temp 97.7 F (36.5 C)    Resp 20    Ht 5' 10"  (1.778 m)    Wt 188 lb (85.3 kg)    SpO2 97%    BMI 26.98 kg/m    Wt Readings from Last 3 Encounters:  02/08/19 188 lb (85.3 kg)  10/30/18 185 lb (83.9 kg)  06/29/18 184 lb 9.6 oz (83.7 kg)    Physical Exam Vitals signs and nursing note reviewed.  Constitutional:      General: He is not in acute distress.    Appearance: Normal appearance. He is well-developed and well-groomed. He is not ill-appearing, toxic-appearing or diaphoretic.  HENT:     Head: Normocephalic and atraumatic.     Jaw: There is normal jaw occlusion.     Right Ear: Hearing normal.     Left Ear: Hearing normal.     Nose: Nose normal.     Mouth/Throat:     Lips: Pink.     Mouth: Mucous membranes are moist.     Pharynx: Oropharynx is clear. Uvula midline.  Eyes:     General: Lids are normal.     Extraocular Movements: Extraocular movements intact.     Conjunctiva/sclera: Conjunctivae normal.     Pupils: Pupils are equal, round, and reactive to light.  Neck:     Musculoskeletal: Normal range of motion and neck supple.     Thyroid: No thyroid mass, thyromegaly or thyroid tenderness.     Vascular: No carotid bruit or JVD.     Trachea: Trachea and phonation normal.  Cardiovascular:     Rate and Rhythm: Normal rate and regular rhythm.     Chest Wall: PMI is not displaced.     Pulses: Normal pulses.     Heart sounds: Normal heart sounds. No murmur. No friction rub. No gallop.   Pulmonary:     Effort: Pulmonary effort is  normal. No respiratory distress.     Breath sounds: Normal breath sounds. No wheezing.  Abdominal:     General: Bowel sounds are normal. There is no distension or abdominal bruit.     Palpations: Abdomen is soft. There is no hepatomegaly or splenomegaly.     Tenderness: There is no abdominal tenderness. There is no right CVA tenderness or left CVA tenderness.     Hernia: No hernia is present.  Musculoskeletal: Normal range of motion.     Right lower leg: No edema.     Left lower leg: No edema.  Lymphadenopathy:     Cervical: No cervical adenopathy.  Skin:    General: Skin is warm and dry.     Capillary Refill: Capillary refill takes less than 2 seconds.     Coloration: Skin is not cyanotic, jaundiced or pale.     Findings: No rash.  Neurological:     General: No focal deficit present.     Mental Status: He is alert and oriented to person, place, and time.  Cranial Nerves: Cranial nerves are intact.     Sensory: Sensation is intact.     Motor: Motor function is intact.     Coordination: Coordination is intact.     Gait: Gait is intact.     Deep Tendon Reflexes: Reflexes are normal and symmetric.  Psychiatric:        Attention and Perception: Attention and perception normal.        Mood and Affect: Affect normal. Mood is anxious.        Speech: Speech normal.        Behavior: Behavior normal. Behavior is cooperative.        Thought Content: Thought content normal.        Cognition and Memory: Cognition and memory normal.        Judgment: Judgment normal.     Results for orders placed or performed in visit on 06/15/18  CMP14+EGFR  Result Value Ref Range   Glucose 90 65 - 99 mg/dL   BUN 17 6 - 20 mg/dL   Creatinine, Ser 1.24 0.76 - 1.27 mg/dL   GFR calc non Af Amer 77 >59 mL/min/1.73   GFR calc Af Amer 89 >59 mL/min/1.73   BUN/Creatinine Ratio 14 9 - 20   Sodium 142 134 - 144 mmol/L   Potassium 4.1 3.5 - 5.2 mmol/L   Chloride 99 96 - 106 mmol/L   CO2 26 20 - 29 mmol/L    Calcium 9.6 8.7 - 10.2 mg/dL   Total Protein 7.0 6.0 - 8.5 g/dL   Albumin 4.5 4.0 - 5.0 g/dL   Globulin, Total 2.5 1.5 - 4.5 g/dL   Albumin/Globulin Ratio 1.8 1.2 - 2.2   Bilirubin Total 0.6 0.0 - 1.2 mg/dL   Alkaline Phosphatase 80 39 - 117 IU/L   AST 14 0 - 40 IU/L   ALT 15 0 - 44 IU/L  CBC with Differential/Platelet  Result Value Ref Range   WBC 7.2 3.4 - 10.8 x10E3/uL   RBC 5.70 4.14 - 5.80 x10E6/uL   Hemoglobin 16.0 13.0 - 17.7 g/dL   Hematocrit 48.2 37.5 - 51.0 %   MCV 85 79 - 97 fL   MCH 28.1 26.6 - 33.0 pg   MCHC 33.2 31.5 - 35.7 g/dL   RDW 13.4 11.6 - 15.4 %   Platelets 296 150 - 450 x10E3/uL   Neutrophils 63 Not Estab. %   Lymphs 26 Not Estab. %   Monocytes 8 Not Estab. %   Eos 2 Not Estab. %   Basos 1 Not Estab. %   Neutrophils Absolute 4.5 1.4 - 7.0 x10E3/uL   Lymphocytes Absolute 1.9 0.7 - 3.1 x10E3/uL   Monocytes Absolute 0.6 0.1 - 0.9 x10E3/uL   EOS (ABSOLUTE) 0.1 0.0 - 0.4 x10E3/uL   Basophils Absolute 0.0 0.0 - 0.2 x10E3/uL   Immature Granulocytes 0 Not Estab. %   Immature Grans (Abs) 0.0 0.0 - 0.1 x10E3/uL  Lipid panel  Result Value Ref Range   Cholesterol, Total 257 (H) 100 - 199 mg/dL   Triglycerides 120 0 - 149 mg/dL   HDL 40 >39 mg/dL   VLDL Cholesterol Cal 24 5 - 40 mg/dL   LDL Calculated 193 (H) 0 - 99 mg/dL   Comment: Comment    Chol/HDL Ratio 6.4 (H) 0.0 - 5.0 ratio  TSH  Result Value Ref Range   TSH 2.470 0.450 - 4.500 uIU/mL  Microalbumin / creatinine urine ratio  Result Value Ref Range   Creatinine, Urine 103.6  Not Estab. mg/dL   Microalbumin, Urine <3.0 Not Estab. ug/mL   Microalb/Creat Ratio <3 0 - 29 mg/g creat       Pertinent labs & imaging results that were available during my care of the patient were reviewed by me and considered in my medical decision making.  Assessment & Plan:  Jacob Benson was seen today for medical management of chronic issues and hypertension.  Diagnoses and all orders for this visit:  Essential  hypertension BP fairly controlled. Changes were not made in regimen today. Daily blood pressure log given with instructions on how to fill out and told to bring to next visit. Goal BP 130/80. Pt aware to report any persistent high or low readings. DASH diet and exercise encouraged. Exercise at least 150 minutes per week and increase as tolerated. Goal BMI > 25. Stress management encouraged. Smoking cessation discussed. Avoid excessive alcohol. Avoid NSAID's. Avoid more than 2000 mg of sodium daily. Medications as prescribed. Follow up as scheduled.  -     CMP14+EGFR -     amLODipine (NORVASC) 5 MG tablet; Take 1 tablet (5 mg total) by mouth daily. -     hydrochlorothiazide (MICROZIDE) 12.5 MG capsule; Take 1 capsule (12.5 mg total) by mouth daily.  GAD (generalized anxiety disorder) Lack of improvement in symptoms on current regimen. Will increase dose of sertraline to 200 mg daily. Pt will take 150 mg of sertraline for 2 weeks and then titrate up to 200 mg if tolerating. Will increase vistaril to 50 mg three times daily as needed and Seroquel to 50 mg nightly. Pt aware to report any, new worsening, or persistent symptoms.  -     ToxASSURE Select 13 (MW), Urine -     hydrOXYzine (VISTARIL) 50 MG capsule; Take 1 capsule (50 mg total) by mouth 3 (three) times daily as needed for anxiety. -     QUEtiapine (SEROQUEL) 50 MG tablet; Take 1 tablet (50 mg total) by mouth at bedtime. -     sertraline (ZOLOFT) 100 MG tablet; Take 2 tablets (200 mg total) by mouth daily.  MDD (major depressive disorder), single episode, moderate (HCC) Lack of improvement in symptoms on current regimen. Will increase dose of sertraline to 200 mg daily. Pt will take 150 mg of sertraline for 2 weeks and then titrate up to 200 mg if tolerating. No SI or HI.  -     ToxASSURE Select 13 (MW), Urine -     sertraline (ZOLOFT) 100 MG tablet; Take 2 tablets (200 mg total) by mouth daily.  Insomnia due to anxiety and fear Pt continues  to have restless nights and trouble falling asleep. This is likely due to poor control of anxiety and depression. Have adjusted regimen for anxiety and depression. Have increase Seroquel to 50 mg nightly. Continue Ambien. Report any new, worsening, or persistent symptoms.  -     zolpidem (AMBIEN) 10 MG tablet; TAKE 1 TABLET BY MOUTH AT BEDTIME AS NEEDED FOR SLEEP FOR UP TO 30 DAYS -     QUEtiapine (SEROQUEL) 50 MG tablet; Take 1 tablet (50 mg total) by mouth at bedtime.  Controlled substance agreement signed Updated for Ambien. Toxassure collected today.  -     ToxASSURE Select 13 (MW), Urine -     zolpidem (AMBIEN) 10 MG tablet; TAKE 1 TABLET BY MOUTH AT BEDTIME AS NEEDED FOR SLEEP FOR UP TO 30 DAYS     Continue all other maintenance medications.  Follow up plan: Return in about  6 months (around 08/08/2019) for anxiety .  Continue healthy lifestyle choices, including diet (rich in fruits, vegetables, and lean proteins, and low in salt and simple carbohydrates) and exercise (at least 30 minutes of moderate physical activity daily).  Educational handout given for GAD  The above assessment and management plan was discussed with the patient. The patient verbalized understanding of and has agreed to the management plan. Patient is aware to call the clinic if they develop any new symptoms or if symptoms persist or worsen. Patient is aware when to return to the clinic for a follow-up visit. Patient educated on when it is appropriate to go to the emergency department.   Monia Pouch, FNP-C [facility] [phone]

## 2019-02-08 NOTE — Patient Instructions (Signed)

## 2019-02-09 LAB — CMP14+EGFR
ALT: 12 IU/L (ref 0–44)
AST: 16 IU/L (ref 0–40)
Albumin/Globulin Ratio: 1.7 (ref 1.2–2.2)
Albumin: 4.6 g/dL (ref 4.0–5.0)
Alkaline Phosphatase: 86 IU/L (ref 39–117)
BUN/Creatinine Ratio: 14 (ref 9–20)
BUN: 18 mg/dL (ref 6–20)
Bilirubin Total: 0.4 mg/dL (ref 0.0–1.2)
CO2: 25 mmol/L (ref 20–29)
Calcium: 9.7 mg/dL (ref 8.7–10.2)
Chloride: 97 mmol/L (ref 96–106)
Creatinine, Ser: 1.31 mg/dL — ABNORMAL HIGH (ref 0.76–1.27)
GFR calc Af Amer: 83 mL/min/{1.73_m2} (ref >59)
GFR calc non Af Amer: 72 mL/min/{1.73_m2} (ref >59)
Globulin, Total: 2.7 g/dL (ref 1.5–4.5)
Glucose: 113 mg/dL — ABNORMAL HIGH (ref 65–99)
Potassium: 3.5 mmol/L (ref 3.5–5.2)
Sodium: 141 mmol/L (ref 134–144)
Total Protein: 7.3 g/dL (ref 6.0–8.5)

## 2019-02-12 LAB — TOXASSURE SELECT 13 (MW), URINE

## 2019-02-15 ENCOUNTER — Other Ambulatory Visit: Payer: Self-pay

## 2019-02-15 DIAGNOSIS — I10 Essential (primary) hypertension: Secondary | ICD-10-CM

## 2019-02-15 MED ORDER — HYDROCHLOROTHIAZIDE 12.5 MG PO CAPS: 12.5000 mg | ORAL_CAPSULE | Freq: Every day | ORAL | 1 refills | Status: DC

## 2019-02-15 MED FILL — HYDROCHLOROTHIAZIDE 12.5 MG: 12.5 | 90 days supply | Qty: 90 | Fill #0

## 2019-02-23 ENCOUNTER — Other Ambulatory Visit: Payer: Self-pay | Admitting: Family Medicine

## 2019-02-23 DIAGNOSIS — L237 Allergic contact dermatitis due to plants, except food: Secondary | ICD-10-CM

## 2019-02-23 MED ORDER — PREDNISONE 10 MG (21) PO TBPK: ORAL_TABLET | ORAL | 0 refills | Status: DC

## 2019-03-05 ENCOUNTER — Other Ambulatory Visit: Payer: Self-pay

## 2019-03-05 DIAGNOSIS — F321 Major depressive disorder, single episode, moderate: Secondary | ICD-10-CM

## 2019-03-05 DIAGNOSIS — F411 Generalized anxiety disorder: Secondary | ICD-10-CM

## 2019-03-05 MED ORDER — SERTRALINE HCL 100 MG PO TABS: 200.0000 mg | ORAL_TABLET | Freq: Every day | ORAL | 1 refills | Status: DC

## 2019-03-07 MED FILL — SERTRALINE HCL 100 MG TAB: 100 | 90 days supply | Qty: 180 | Fill #0

## 2019-03-12 MED FILL — MONTELUKAST SOD 10 MG TAB: 10 | 90 days supply | Qty: 90 | Fill #1

## 2019-03-12 MED FILL — AMLODIPINE BESYLATE 5 MG TA: 5 | 90 days supply | Qty: 90 | Fill #1

## 2019-03-18 ENCOUNTER — Other Ambulatory Visit: Payer: Self-pay | Admitting: Family Medicine

## 2019-03-18 DIAGNOSIS — Z79899 Other long term (current) drug therapy: Secondary | ICD-10-CM

## 2019-03-18 DIAGNOSIS — F409 Phobic anxiety disorder, unspecified: Secondary | ICD-10-CM

## 2019-03-19 ENCOUNTER — Other Ambulatory Visit: Payer: Self-pay | Admitting: *Deleted

## 2019-03-19 DIAGNOSIS — F409 Phobic anxiety disorder, unspecified: Secondary | ICD-10-CM

## 2019-03-19 DIAGNOSIS — Z79899 Other long term (current) drug therapy: Secondary | ICD-10-CM

## 2019-03-19 MED ORDER — ZOLPIDEM TARTRATE 10 MG PO TABS: ORAL_TABLET | ORAL | 0 refills | Status: DC

## 2019-04-04 ENCOUNTER — Ambulatory Visit (INDEPENDENT_AMBULATORY_CARE_PROVIDER_SITE_OTHER): Payer: No Typology Code available for payment source | Admitting: Family Medicine

## 2019-04-04 ENCOUNTER — Encounter: Payer: Self-pay | Admitting: Family Medicine

## 2019-04-04 ENCOUNTER — Other Ambulatory Visit: Payer: Self-pay

## 2019-04-04 VITALS — BP 131/81 | HR 66 | Temp 98.9°F | Resp 20 | Ht 70.0 in | Wt 197.0 lb

## 2019-04-04 DIAGNOSIS — F411 Generalized anxiety disorder: Secondary | ICD-10-CM | POA: Diagnosis not present

## 2019-04-04 DIAGNOSIS — F5105 Insomnia due to other mental disorder: Secondary | ICD-10-CM

## 2019-04-04 DIAGNOSIS — Z3009 Encounter for other general counseling and advice on contraception: Secondary | ICD-10-CM | POA: Diagnosis not present

## 2019-04-04 DIAGNOSIS — F321 Major depressive disorder, single episode, moderate: Secondary | ICD-10-CM | POA: Diagnosis not present

## 2019-04-04 DIAGNOSIS — F409 Phobic anxiety disorder, unspecified: Secondary | ICD-10-CM

## 2019-04-04 MED ORDER — FLUOXETINE HCL 20 MG PO CAPS: 20.0000 mg | ORAL_CAPSULE | Freq: Every day | ORAL | 5 refills | Status: DC

## 2019-04-04 MED ORDER — ZOLPIDEM TARTRATE ER 12.5 MG PO TBCR: 12.5000 mg | EXTENDED_RELEASE_TABLET | Freq: Every evening | ORAL | 5 refills | Status: DC | PRN

## 2019-04-04 NOTE — Progress Notes (Signed)
Subjective:  Patient ID: Jacob Benson, male    DOB: 08-23-1985, 33 y.o.   MRN: [id number]  Patient Care Team: Baruch Gouty, FNP as PCP - General (Family Medicine) Malic Rosten, Connye Burkitt, FNP as Nurse Practitioner (Family Medicine)   Chief Complaint:  Medical Management of Chronic Issues and Referral (vasectomy)   HPI: Jacob Benson is a 33 y.o. male presenting on 04/04/2019 for Medical Management of Chronic Issues and Referral (vasectomy)    1. Encounter for counseling regarding contraception Pt presents today to discuss getting a vasectomy. Pt has two children at home, one a newborn. He and his wife have decided they would not like any more children. He would like to have a vasectomy and would like a referral to urology to discuss this further.   2. GAD (generalized anxiety disorder) 3. Insomnia due to anxiety and fear 4. MDD (major depressive disorder), single episode, moderate ([place]) Pt reports ongoing and worsening anxiety. States he does not feel the sertraline is beneficial and feels it is causing him to have a slight tremor. He would like to switch to another medication. Pt states he is still having trouble sleeping. States it takes a while for him to fall asleep and then he wakes up at least 2 times per night. States his is very anxious and stressed due to work stress and the stress of a new baby. States he did not see any benefit from the Vistaril so he quit taking it. He does not wish to see a counselor or psychiatrist.  GAD 7 : Generalized Anxiety Score 04/04/2019 02/08/2019 09/21/2018 06/29/2018  Nervous, Anxious, on Edge 3 3 3 1   Control/stop worrying 3 3 2 3   Worry too much - different things 3 3 3 3   Trouble relaxing 2 2 2 2   Restless 3 3 3  0  Easily annoyed or irritable 3 3 3 3   Afraid - awful might happen 2 2 0 3  Total GAD 7 Score 19 19 16 15   Anxiety Difficulty Somewhat difficult Somewhat difficult - Somewhat difficult    Depression screen [facility] 2/9 04/04/2019  02/08/2019 10/30/2018 09/21/2018 06/29/2018  Decreased Interest 2 1 0 1 0  Down, Depressed, Hopeless 2 0 0 1 0  PHQ - 2 Score 4 1 0 2 0  Altered sleeping 3 2 - 3 3  Tired, decreased energy 3 2 - 3 1  Change in appetite 1 0 - 0 0  Feeling bad or failure about yourself  2 0 - 0 0  Trouble concentrating 2 1 - 2 2  Moving slowly or fidgety/restless 3 2 - 3 0  Suicidal thoughts 1 0 - 0 0  PHQ-9 Score 19 8 - 13 6  Difficult doing work/chores Somewhat difficult Somewhat difficult - - Somewhat difficult  Some recent data might be hidden      Relevant past medical, surgical, family, and social history reviewed and updated as indicated.  Allergies and medications reviewed and updated. Date reviewed: Chart in Epic.   Past Medical History:  Diagnosis Date  . Calcium kidney stones 2007  . Hyperlipidemia   . Hypertension   . Olecranon fracture    left    Past Surgical History:  Procedure Laterality Date  . FRACTURE SURGERY    . I&D EXTREMITY Left 09/13/2015   Procedure: IRRIGATION AND DEBRIDEMENT EXTREMITY;  Surgeon: Leandrew Koyanagi, MD;  Location: [facility];  Service: Orthopedics;  Laterality: Left;  . ORIF ELBOW FRACTURE Left 10/30/2015  Procedure: OPEN REDUCTION INTERNAL FIXATION (ORIF) LEFT OLECRANON FRACTURE;  Surgeon: Leandrew Koyanagi, MD;  Location: [place];  Service: Orthopedics;  Laterality: Left;  . ORIF HUMERUS FRACTURE Left 09/13/2015   Procedure: OPEN REDUCTION INTERNAL FIXATION (ORIF) ELBOW/OLECRANON AND I AND D;  Surgeon: Leandrew Koyanagi, MD;  Location: [place];  Service: Orthopedics;  Laterality: Left;  . TONSILLECTOMY      Social History   Socioeconomic History  . Marital status: Married    Spouse name: Not on file  . Number of children: Not on file  . Years of education: Not on file  . Highest education level: Not on file  Occupational History  . Not on file  Social Needs  . Financial resource strain: Not on file  . Food insecurity    Worry: Not on file    Inability: Not on file   . Transportation needs    Medical: Not on file    Non-medical: Not on file  Tobacco Use  . Smoking status: Former Smoker    Packs/day: 0.50    Types: Cigarettes    Quit date: 05/23/2013    Years since quitting: 5.8  . Smokeless tobacco: Former Systems developer    Types: Snuff  Substance and Sexual Activity  . Alcohol use: No    Alcohol/week: 0.0 standard drinks  . Drug use: No  . Sexual activity: Not on file  Lifestyle  . Physical activity    Days per week: Not on file    Minutes per session: Not on file  . Stress: Not on file  Relationships  . Social Herbalist on phone: Not on file    Gets together: Not on file    Attends religious service: Not on file    Active member of club or organization: Not on file    Attends meetings of clubs or organizations: Not on file    Relationship status: Not on file  . Intimate partner violence    Fear of current or ex partner: Not on file    Emotionally abused: Not on file    Physically abused: Not on file    Forced sexual activity: Not on file  Other Topics Concern  . Not on file  Social History Narrative  . Not on file    Outpatient Encounter Medications as of 04/04/2019  Medication Sig  . amLODipine (NORVASC) 5 MG tablet Take 1 tablet (5 mg total) by mouth daily.  Marland Kitchen esomeprazole (NEXIUM) 40 MG capsule TAKE 1 CAPSULE (40 MG TOTAL) BY MOUTH DAILY.  . hydrochlorothiazide (MICROZIDE) 12.5 MG capsule Take 1 capsule (12.5 mg total) by mouth daily.  . montelukast (SINGULAIR) 10 MG tablet TAKE 1 TABLET BY MOUTH AT BEDTIME  . QUEtiapine (SEROQUEL) 50 MG tablet Take 1 tablet (50 mg total) by mouth at bedtime.  . [DISCONTINUED] hydrOXYzine (VISTARIL) 50 MG capsule Take 1 capsule (50 mg total) by mouth 3 (three) times daily as needed for anxiety.  . [DISCONTINUED] sertraline (ZOLOFT) 100 MG tablet Take 2 tablets (200 mg total) by mouth daily.  . [DISCONTINUED] zolpidem (AMBIEN) 10 MG tablet TAKE 1 TABLET BY MOUTH AT BEDTIME AS NEEDED FOR SLEEP  FOR UP TO 30 DAYS  . FLUoxetine (PROZAC) 20 MG capsule Take 1 capsule (20 mg total) by mouth daily.  Marland Kitchen zolpidem (AMBIEN CR) 12.5 MG CR tablet Take 1 tablet (12.5 mg total) by mouth at bedtime as needed for sleep.  . [DISCONTINUED] predniSONE (STERAPRED UNI-PAK 21 TAB) 10 MG (  21) TBPK tablet As directed x 6 days   No facility-administered encounter medications on file as of 04/04/2019.     Allergies  Allergen Reactions  . Guaifenesin & Derivatives Palpitations  . Oxycodone Nausea And Vomiting    Review of Systems  Constitutional: Negative for activity change, appetite change, chills, diaphoresis, fatigue, fever and unexpected weight change.  HENT: Negative.   Eyes: Negative.   Respiratory: Negative for cough, chest tightness and shortness of breath.   Cardiovascular: Negative for chest pain, palpitations and leg swelling.  Gastrointestinal: Negative for abdominal pain, blood in stool, constipation, diarrhea, nausea and vomiting.  Endocrine: Negative.   Genitourinary: Negative for dysuria, frequency and urgency.  Musculoskeletal: Negative for arthralgias and myalgias.  Skin: Negative.   Allergic/Immunologic: Negative.   Neurological: Positive for tremors. Negative for dizziness, seizures, syncope, facial asymmetry, speech difficulty, weakness, light-headedness, numbness and headaches.  Hematological: Negative.   Psychiatric/Behavioral: Positive for agitation, decreased concentration, dysphoric mood and sleep disturbance. Negative for behavioral problems, confusion, hallucinations, self-injury and suicidal ideas. The patient is nervous/anxious. The patient is not hyperactive.   All other systems reviewed and are negative.       Objective:  BP 131/81   Pulse 66   Temp 98.9 F (37.2 C)   Resp 20   Ht 5' 10"  (1.778 m)   Wt 197 lb (89.4 kg)   SpO2 99%   BMI 28.27 kg/m    Wt Readings from Last 3 Encounters:  04/04/19 197 lb (89.4 kg)  02/08/19 188 lb (85.3 kg)  10/30/18 185  lb (83.9 kg)    Physical Exam Vitals signs and nursing note reviewed.  Constitutional:      General: He is not in acute distress.    Appearance: Normal appearance. He is well-developed and well-groomed. He is not ill-appearing, toxic-appearing or diaphoretic.  HENT:     Head: Normocephalic and atraumatic.     Jaw: There is normal jaw occlusion.     Right Ear: Hearing normal.     Left Ear: Hearing normal.     Nose: Nose normal.     Mouth/Throat:     Lips: Pink.     Mouth: Mucous membranes are moist.     Pharynx: Oropharynx is clear. Uvula midline.  Eyes:     General: Lids are normal.     Extraocular Movements: Extraocular movements intact.     Conjunctiva/sclera: Conjunctivae normal.     Pupils: Pupils are equal, round, and reactive to light.  Neck:     Musculoskeletal: Normal range of motion and neck supple.     Thyroid: No thyroid mass, thyromegaly or thyroid tenderness.     Vascular: No carotid bruit or JVD.     Trachea: Trachea and phonation normal.  Cardiovascular:     Rate and Rhythm: Normal rate and regular rhythm.     Chest Wall: PMI is not displaced.     Pulses: Normal pulses.     Heart sounds: Normal heart sounds. No murmur. No friction rub. No gallop.   Pulmonary:     Effort: Pulmonary effort is normal. No respiratory distress.     Breath sounds: Normal breath sounds. No wheezing.  Abdominal:     General: Bowel sounds are normal. There is no distension or abdominal bruit.     Palpations: Abdomen is soft. There is no hepatomegaly or splenomegaly.     Tenderness: There is no abdominal tenderness. There is no right CVA tenderness or left CVA tenderness.  Hernia: No hernia is present.  Musculoskeletal: Normal range of motion.     Right lower leg: No edema.     Left lower leg: No edema.  Lymphadenopathy:     Cervical: No cervical adenopathy.  Skin:    General: Skin is warm and dry.     Capillary Refill: Capillary refill takes less than 2 seconds.      Coloration: Skin is not cyanotic, jaundiced or pale.     Findings: No rash.  Neurological:     General: No focal deficit present.     Mental Status: He is alert and oriented to person, place, and time.     Cranial Nerves: Cranial nerves are intact.     Sensory: Sensation is intact.     Motor: Motor function is intact.     Coordination: Coordination is intact.     Gait: Gait is intact.     Deep Tendon Reflexes: Reflexes are normal and symmetric.  Psychiatric:        Attention and Perception: Attention and perception normal.        Mood and Affect: Affect normal. Mood is anxious.        Speech: Speech normal.        Behavior: Behavior normal. Behavior is cooperative.        Thought Content: Thought content normal.        Cognition and Memory: Cognition and memory normal.        Judgment: Judgment normal.     Results for orders placed or performed in visit on 02/08/19  CMP14+EGFR  Result Value Ref Range   Glucose 113 (H) 65 - 99 mg/dL   BUN 18 6 - 20 mg/dL   Creatinine, Ser 1.31 (H) 0.76 - 1.27 mg/dL   GFR calc non Af Amer 72 >59 mL/min/1.73   GFR calc Af Amer 83 >59 mL/min/1.73   BUN/Creatinine Ratio 14 9 - 20   Sodium 141 134 - 144 mmol/L   Potassium 3.5 3.5 - 5.2 mmol/L   Chloride 97 96 - 106 mmol/L   CO2 25 20 - 29 mmol/L   Calcium 9.7 8.7 - 10.2 mg/dL   Total Protein 7.3 6.0 - 8.5 g/dL   Albumin 4.6 4.0 - 5.0 g/dL   Globulin, Total 2.7 1.5 - 4.5 g/dL   Albumin/Globulin Ratio 1.7 1.2 - 2.2   Bilirubin Total 0.4 0.0 - 1.2 mg/dL   Alkaline Phosphatase 86 39 - 117 IU/L   AST 16 0 - 40 IU/L   ALT 12 0 - 44 IU/L  ToxASSURE Select 13 (MW), Urine  Result Value Ref Range   Summary Note        Pertinent labs & imaging results that were available during my care of the patient were reviewed by me and considered in my medical decision making.  Assessment & Plan:  Gorje was seen today for medical management of chronic issues and referral.  Diagnoses and all orders for  this visit:  Encounter for counseling regarding contraception Pt would like to have vasectomy. Referral to urology placed.  -     Ambulatory referral to Urology  GAD (generalized anxiety disorder) MDD (major depressive disorder), single episode, moderate ([place]) Ongoing and worsening symptoms. Not happy with sertraline. Will switch to fluoxetine. Pt declines referral to psychology / psychiatry. Will trial below. Report any new or worsening symptoms. Follow up in 4-6 weeks.  -     FLUoxetine (PROZAC) 20 MG capsule; Take 1 capsule (20 mg total)  by mouth daily.  Insomnia due to anxiety and fear Continues to have problems with sleep. Discussed need for better control of anxiety. Will increase Ambien to 12.5 mg controlled release to see if beneficial. Sleep hygiene discussed in detail. Follow up in 4-6 weeks for reevaluation.  -     zolpidem (AMBIEN CR) 12.5 MG CR tablet; Take 1 tablet (12.5 mg total) by mouth at bedtime as needed for sleep.    Continue all other maintenance medications.  Follow up plan: Return in 4 weeks (on 05/02/2019), or if symptoms worsen or fail to improve, for depression, anxiety.  Continue healthy lifestyle choices, including diet (rich in fruits, vegetables, and lean proteins, and low in salt and simple carbohydrates) and exercise (at least 30 minutes of moderate physical activity daily).  The above assessment and management plan was discussed with the patient. The patient verbalized understanding of and has agreed to the management plan. Patient is aware to call the clinic if they develop any new symptoms or if symptoms persist or worsen. Patient is aware when to return to the clinic for a follow-up visit. Patient educated on when it is appropriate to go to the emergency department.   Monia Pouch, FNP-C [facility] [phone]

## 2019-04-12 MED FILL — ESOMEPRAZOLE MAG DR 40 MG C: 40 | 90 days supply | Qty: 90 | Fill #1

## 2019-04-15 ENCOUNTER — Encounter: Payer: Self-pay | Admitting: Family Medicine

## 2019-04-23 NOTE — Telephone Encounter (Signed)
Spoke with Patient - He has an appt at [facility]

## 2019-05-31 ENCOUNTER — Other Ambulatory Visit: Payer: Self-pay | Admitting: Family Medicine

## 2019-05-31 MED FILL — MONTELUKAST SOD 10 MG TAB: 10 | 90 days supply | Qty: 90 | Fill #0

## 2019-06-18 ENCOUNTER — Other Ambulatory Visit: Payer: Self-pay | Admitting: Family Medicine

## 2019-06-18 DIAGNOSIS — I10 Essential (primary) hypertension: Secondary | ICD-10-CM

## 2019-06-18 MED FILL — HYDROCHLOROTHIAZIDE 12.5 MG: 12.5 | 90 days supply | Qty: 90 | Fill #1

## 2019-06-18 MED FILL — AMLODIPINE BESYLATE 5 MG TA: 5 | 90 days supply | Qty: 90 | Fill #1

## 2019-06-30 ENCOUNTER — Other Ambulatory Visit: Payer: Self-pay | Admitting: Family Medicine

## 2019-06-30 DIAGNOSIS — F411 Generalized anxiety disorder: Secondary | ICD-10-CM

## 2019-07-02 ENCOUNTER — Other Ambulatory Visit: Payer: Self-pay

## 2019-07-02 MED ORDER — ESOMEPRAZOLE MAGNESIUM 40 MG PO CPDR: 40.0000 mg | DELAYED_RELEASE_CAPSULE | Freq: Every day | ORAL | 1 refills | Status: DC

## 2019-07-02 MED FILL — ESOMEPRAZOLE MAG DR 40 MG C: 40 | 90 days supply | Qty: 90 | Fill #0

## 2019-07-02 NOTE — Telephone Encounter (Signed)
Requesting Nexium rx sent to pharmacy. Wife aware rx has been sent.

## 2019-07-17 ENCOUNTER — Other Ambulatory Visit: Payer: Self-pay | Admitting: Family Medicine

## 2019-07-17 DIAGNOSIS — F411 Generalized anxiety disorder: Secondary | ICD-10-CM

## 2019-07-27 ENCOUNTER — Encounter: Payer: Self-pay | Admitting: Family Medicine

## 2019-07-27 ENCOUNTER — Other Ambulatory Visit: Payer: Self-pay

## 2019-07-27 ENCOUNTER — Ambulatory Visit (INDEPENDENT_AMBULATORY_CARE_PROVIDER_SITE_OTHER): Payer: No Typology Code available for payment source | Admitting: Family Medicine

## 2019-07-27 VITALS — BP 148/86 | HR 78 | Temp 98.7°F | Ht 70.0 in | Wt 200.8 lb

## 2019-07-27 DIAGNOSIS — R238 Other skin changes: Secondary | ICD-10-CM | POA: Diagnosis not present

## 2019-07-27 MED ORDER — METHYLPREDNISOLONE ACETATE 80 MG/ML IJ SUSP
80.0000 mg | Freq: Once | INTRAMUSCULAR | Status: AC
  Administered 2019-07-27: 80 mg via INTRAMUSCULAR

## 2019-07-27 NOTE — Patient Instructions (Signed)
Rash, Adult  A rash is a change in the color of your skin. A rash can also change the way your skin feels. There are many different conditions and factors that can cause a rash. Follow these instructions at home: The goal of treatment is to stop the itching and keep the rash from spreading. Watch for any changes in your symptoms. Let your doctor know about them. Follow these instructions to help with your condition: Medicine Take or apply over-the-counter and prescription medicines only as told by your doctor. These may include medicines:  To treat red or swollen skin (corticosteroid creams).  To treat itching.  To treat an allergy (oral antihistamines).  To treat very bad symptoms (oral corticosteroids).  Skin care  Put cool cloths (compresses) on the affected areas.  Do not scratch or rub your skin.  Avoid covering the rash. Make sure that the rash is exposed to air as much as possible. Managing itching and discomfort  Avoid hot showers or baths. These can make itching worse. A cold shower may help.  Try taking a bath with: ? Epsom salts. You can get these at your local pharmacy or grocery store. Follow the instructions on the package. ? Baking soda. Pour a small amount into the bath as told by your doctor. ? Colloidal oatmeal. You can get this at your local pharmacy or grocery store. Follow the instructions on the package.  Try putting baking soda paste onto your skin. Stir water into baking soda until it gets like a paste.  Try putting on a lotion that relieves itchiness (calamine lotion).  Keep cool and out of the sun. Sweating and being hot can make itching worse. General instructions   Rest as needed.  Drink enough fluid to keep your pee (urine) pale yellow.  Wear loose-fitting clothing.  Avoid scented soaps, detergents, and perfumes. Use gentle soaps, detergents, perfumes, and other cosmetic products.  Avoid anything that causes your rash. Keep a journal to  help track what causes your rash. Write down: ? What you eat. ? What cosmetic products you use. ? What you drink. ? What you wear. This includes jewelry.  Keep all follow-up visits as told by your doctor. This is important. Contact a doctor if:  You sweat at night.  You lose weight.  You pee (urinate) more than normal.  You pee less than normal, or you notice that your pee is a darker color than normal.  You feel weak.  You throw up (vomit).  Your skin or the whites of your eyes look yellow (jaundice).  Your skin: ? Tingles. ? Is numb.  Your rash: ? Does not go away after a few days. ? Gets worse.  You are: ? More thirsty than normal. ? More tired than normal.  You have: ? New symptoms. ? Pain in your belly (abdomen). ? A fever. ? Watery poop (diarrhea). Get help right away if:  You have a fever and your symptoms suddenly get worse.  You start to feel mixed up (confused).  You have a very bad headache or a stiff neck.  You have very bad joint pains or stiffness.  You have jerky movements that you cannot control (seizure).  Your rash covers all or most of your body. The rash may or may not be painful.  You have blisters that: ? Are on top of the rash. ? Grow larger. ? Grow together. ? Are painful. ? Are inside your nose or mouth.  You have a rash   that: ? Looks like purple pinprick-sized spots all over your body. ? Has a "bull's eye" or looks like a target. ? Is red and painful, causes your skin to peel, and is not from being in the sun too long. Summary  A rash is a change in the color of your skin. A rash can also change the way your skin feels.  The goal of treatment is to stop the itching and keep the rash from spreading.  Take or apply over-the-counter and prescription medicines only as told by your doctor.  Contact a doctor if you have new symptoms or symptoms that get worse.  Keep all follow-up visits as told by your doctor. This is  important. This information is not intended to replace advice given to you by your health care provider. Make sure you discuss any questions you have with your health care provider. Document Revised: 08/25/2018 Document Reviewed: 12/05/2017 Elsevier Patient Education  2020 [employer]  

## 2019-07-27 NOTE — Progress Notes (Signed)
Assessment & Plan:  1. Skin irritation - Encouraged hydrating lotion to areas as well. Patient preferred shot over steroid taper.  Education provided on rashes. - methylPREDNISolone acetate (DEPO-MEDROL) injection 80 mg   Follow up plan: Return if symptoms worsen or fail to improve.  [name], MSN, APRN, FNP-C [facility]  Subjective:   Patient ID: Jacob Benson, male    DOB: [birth year], 34 y.o.   MRN: [id number]  HPI: [name] is a 34 y.o. male presenting on 07/27/2019 for Redness (Right hip)  Patient reports this morning around 6 AM he noticed a reddened area that has a burning/shooting pain on his right hip.  Has had shingles before and states the pain feels similar but not the same.  Denies use of any new soaps, laundry detergent, or new close.  He has washed a few things in the baby's detergent recently.   ROS: Negative unless specifically indicated above in HPI.   Relevant past medical history reviewed and updated as indicated.   Allergies and medications reviewed and updated.   Current Outpatient Medications:  .  amLODipine (NORVASC) 5 MG tablet, TAKE 1 TABLET BY MOUTH DAILY., Disp: 90 tablet, Rfl: 0 .  esomeprazole (NEXIUM) 40 MG capsule, Take 1 capsule (40 mg total) by mouth daily., Disp: 90 capsule, Rfl: 1 .  hydrochlorothiazide (MICROZIDE) 12.5 MG capsule, Take 1 capsule (12.5 mg total) by mouth daily., Disp: 90 capsule, Rfl: 1 .  montelukast (SINGULAIR) 10 MG tablet, TAKE 1 TABLET BY MOUTH AT BEDTIME, Disp: 90 tablet, Rfl: 1 .  zolpidem (AMBIEN CR) 12.5 MG CR tablet, Take 1 tablet (12.5 mg total) by mouth at bedtime as needed for sleep., Disp: 30 tablet, Rfl: 5 .  FLUoxetine (PROZAC) 20 MG capsule, Take 1 capsule (20 mg total) by mouth daily., Disp: 30 capsule, Rfl: 5 .  QUEtiapine (SEROQUEL) 50 MG tablet, Take 1 tablet (50 mg total) by mouth at bedtime., Disp: 90 tablet, Rfl: 1  Allergies  Allergen Reactions  . Guaifenesin &  Derivatives Palpitations  . Oxycodone Nausea And Vomiting    Objective:   BP (!) 148/86   Pulse 78   Temp 98.7 F (37.1 C) (Temporal)   Ht 5\' 10"  (1.778 m)   Wt 200 lb 12.8 oz (91.1 kg)   BMI 28.81 kg/m    Physical Exam Vitals reviewed.  Constitutional:      General: He is not in acute distress.    Appearance: Normal appearance. He is not ill-appearing, toxic-appearing or diaphoretic.  HENT:     Head: Normocephalic and atraumatic.  Eyes:     General: No scleral icterus.       Right eye: No discharge.        Left eye: No discharge.     Conjunctiva/sclera: Conjunctivae normal.  Cardiovascular:     Rate and Rhythm: Normal rate.  Pulmonary:     Effort: Pulmonary effort is normal. No respiratory distress.  Musculoskeletal:        General: Normal range of motion.     Cervical back: Normal range of motion.  Skin:    General: Skin is warm and dry.     Findings: Rash (erythematous with darker red lines throughout. There are no raised areas. ) present.       Neurological:     Mental Status: He is alert and oriented to person, place, and time. Mental status is at baseline.  Psychiatric:        Mood  and Affect: Mood normal.        Behavior: Behavior normal.        Thought Content: Thought content normal.        Judgment: Judgment normal.

## 2019-07-30 ENCOUNTER — Encounter: Payer: Self-pay | Admitting: Family Medicine

## 2019-08-09 ENCOUNTER — Other Ambulatory Visit: Payer: Self-pay | Admitting: Family Medicine

## 2019-08-09 ENCOUNTER — Encounter: Payer: Self-pay | Admitting: Family Medicine

## 2019-08-09 ENCOUNTER — Ambulatory Visit (INDEPENDENT_AMBULATORY_CARE_PROVIDER_SITE_OTHER): Payer: No Typology Code available for payment source | Admitting: Family Medicine

## 2019-08-09 DIAGNOSIS — F321 Major depressive disorder, single episode, moderate: Secondary | ICD-10-CM | POA: Diagnosis not present

## 2019-08-09 DIAGNOSIS — J301 Allergic rhinitis due to pollen: Secondary | ICD-10-CM | POA: Diagnosis not present

## 2019-08-09 DIAGNOSIS — I10 Essential (primary) hypertension: Secondary | ICD-10-CM

## 2019-08-09 DIAGNOSIS — F411 Generalized anxiety disorder: Secondary | ICD-10-CM | POA: Diagnosis not present

## 2019-08-09 DIAGNOSIS — F5105 Insomnia due to other mental disorder: Secondary | ICD-10-CM

## 2019-08-09 DIAGNOSIS — F409 Phobic anxiety disorder, unspecified: Secondary | ICD-10-CM

## 2019-08-09 DIAGNOSIS — K219 Gastro-esophageal reflux disease without esophagitis: Secondary | ICD-10-CM

## 2019-08-09 MED ORDER — QUETIAPINE FUMARATE 50 MG PO TABS: 50.0000 mg | ORAL_TABLET | Freq: Every day | ORAL | 3 refills | Status: DC

## 2019-08-09 MED ORDER — ZOLPIDEM TARTRATE ER 12.5 MG PO TBCR: 12.5000 mg | EXTENDED_RELEASE_TABLET | Freq: Every evening | ORAL | 5 refills | Status: DC | PRN

## 2019-08-09 MED ORDER — FLUOXETINE HCL 20 MG PO CAPS: 20.0000 mg | ORAL_CAPSULE | Freq: Every day | ORAL | 11 refills | Status: DC

## 2019-08-09 MED ORDER — ESOMEPRAZOLE MAGNESIUM 40 MG PO CPDR: 40.0000 mg | DELAYED_RELEASE_CAPSULE | Freq: Every day | ORAL | 3 refills | Status: DC

## 2019-08-09 MED ORDER — HYDROCHLOROTHIAZIDE 12.5 MG PO CAPS: 12.5000 mg | ORAL_CAPSULE | Freq: Every day | ORAL | 3 refills | Status: DC

## 2019-08-09 MED ORDER — AMLODIPINE BESYLATE 5 MG PO TABS: 5.0000 mg | ORAL_TABLET | Freq: Every day | ORAL | 3 refills | Status: DC

## 2019-08-09 MED ORDER — MONTELUKAST SODIUM 10 MG PO TABS: 10.0000 mg | ORAL_TABLET | Freq: Every day | ORAL | 3 refills | Status: DC

## 2019-08-09 MED FILL — QUETIAPINE FUMARATE 50 MG T: 50 | 90 days supply | Qty: 90 | Fill #0

## 2019-08-09 MED FILL — FLUoxetine HCL 20 MG CAPS: 20 | 30 days supply | Qty: 30 | Fill #0

## 2019-08-09 NOTE — Progress Notes (Signed)
Virtual Visit via telephone Note Due to COVID-19 pandemic this visit was conducted virtually. This visit type was conducted due to national recommendations for restrictions regarding the COVID-19 Pandemic (e.g. social distancing, sheltering in place) in an effort to limit this patient's exposure and mitigate transmission in our community. All issues noted in this document were discussed and addressed.  A physical exam was not performed with this format.   I connected with Jacob Benson on 08/09/2019 at 1435 by telephone and verified that I am speaking with the correct person using two identifiers. Jacob Benson is currently located at home and family is currently with them during visit. The provider, Monia Pouch, FNP is located in their office at time of visit.  I discussed the limitations, risks, security and privacy concerns of performing an evaluation and management service by telephone and the availability of in person appointments. I also discussed with the patient that there may be a patient responsible charge related to this service. The patient expressed understanding and agreed to proceed.  Subjective:  Patient ID: Jacob Benson, male    DOB: 22-Feb-1986, 34 y.o.   MRN: [id number]  Chief Complaint:  Medical Management of Chronic Issues   HPI: Jacob Benson is a 34 y.o. male presenting on 08/09/2019 for Medical Management of Chronic Issues   1. Essential hypertension Well controlled on current regimen. No chest pain, shortness of breath, leg swelling, weakness, or confusion. Does try to watch diet.   2. GAD (generalized anxiety disorder) Symptoms have greatly improved with current regimen. Is compliant with medications and tolerating well.   GAD 7 : Generalized Anxiety Score 08/09/2019 04/04/2019 02/08/2019 09/21/2018  Nervous, Anxious, on Edge 1 3 3 3   Control/stop worrying 1 3 3 2   Worry too much - different things 1 3 3 3   Trouble relaxing 1 2 2 2   Restless 1 3 3 3    Easily annoyed or irritable 1 3 3 3   Afraid - awful might happen 1 2 2  0  Total GAD 7 Score 7 19 19 16   Anxiety Difficulty - Somewhat difficult Somewhat difficult -      3. MDD (major depressive disorder), single episode, moderate ([place]) Doing great on current medication regimen. Tolerating well.  Depression screen [facility] 2/9 08/09/2019 07/27/2019 04/04/2019 02/08/2019 10/30/2018  Decreased Interest 0 0 2 1 0  Down, Depressed, Hopeless 0 0 2 0 0  PHQ - 2 Score 0 0 4 1 0  Altered sleeping - - 3 2 -  Tired, decreased energy - - 3 2 -  Change in appetite - - 1 0 -  Feeling bad or failure about yourself  - - 2 0 -  Trouble concentrating - - 2 1 -  Moving slowly or fidgety/restless - - 3 2 -  Suicidal thoughts - - 1 0 -  PHQ-9 Score - - 19 8 -  Difficult doing work/chores - - Somewhat difficult Somewhat difficult -  Some recent data might be hidden     4. Insomnia due to anxiety and fear Doing good with the combination of Seroquel for his anxiety and Ambien for insomnia. No associated side effects.   5. Seasonal allergic rhinitis due to pollen Does well with nightly Singulair. Tolerating well. No new or worsening symptoms.   6. Gastroesophageal reflux disease without esophagitis Well controlled. No red flags concerning for malignancy or esophagitis.    Relevant past medical, surgical, family, and social history reviewed and updated as indicated.  Allergies and medications reviewed and updated.   Past Medical History:  Diagnosis Date  . Calcium kidney stones 2007  . Hyperlipidemia   . Hypertension   . Olecranon fracture    left  . Shingles 2006    Past Surgical History:  Procedure Laterality Date  . FRACTURE SURGERY    . I & D EXTREMITY Left 09/13/2015   Procedure: IRRIGATION AND DEBRIDEMENT EXTREMITY;  Surgeon: Tarry Kos, MD;  Location: [facility] OR;  Service: Orthopedics;  Laterality: Left;  . ORIF ELBOW FRACTURE Left 10/30/2015   Procedure: OPEN REDUCTION INTERNAL FIXATION  (ORIF) LEFT OLECRANON FRACTURE;  Surgeon: Tarry Kos, MD;  Location: [facility] OR;  Service: Orthopedics;  Laterality: Left;  . ORIF HUMERUS FRACTURE Left 09/13/2015   Procedure: OPEN REDUCTION INTERNAL FIXATION (ORIF) ELBOW/OLECRANON AND I AND D;  Surgeon: Tarry Kos, MD;  Location: [facility] OR;  Service: Orthopedics;  Laterality: Left;  . TONSILLECTOMY    . VASECTOMY      Social History   Socioeconomic History  . Marital status: Married    Spouse name: Doyne Keel  . Number of children: 2  . Years of education: Not on file  . Highest education level: Not on file  Occupational History  . Not on file  Tobacco Use  . Smoking status: Former Smoker    Packs/day: 0.50    Types: Cigarettes    Quit date: 05/23/2013    Years since quitting: 6.2  . Smokeless tobacco: Former Neurosurgeon    Types: Snuff  Substance and Sexual Activity  . Alcohol use: No    Alcohol/week: 0.0 standard drinks  . Drug use: No  . Sexual activity: Yes    Birth control/protection: Surgical  Other Topics Concern  . Not on file  Social History Narrative  . Not on file   Social Determinants of Health   Financial Resource Strain:   . Difficulty of Paying Living Expenses:   Food Insecurity:   . Worried About Programme researcher, broadcasting/film/video in the Last Year:   . Barista in the Last Year:   Transportation Needs:   . Freight forwarder (Medical):   Marland Kitchen Lack of Transportation (Non-Medical):   Physical Activity:   . Days of Exercise per Week:   . Minutes of Exercise per Session:   Stress:   . Feeling of Stress :   Social Connections:   . Frequency of Communication with Friends and Family:   . Frequency of Social Gatherings with Friends and Family:   . Attends Religious Services:   . Active Member of Clubs or Organizations:   . Attends Banker Meetings:   Marland Kitchen Marital Status:   Intimate Partner Violence:   . Fear of Current or Ex-Partner:   . Emotionally Abused:   Marland Kitchen Physically Abused:   . Sexually Abused:      Outpatient Encounter Medications as of 08/09/2019  Medication Sig  . amLODipine (NORVASC) 5 MG tablet Take 1 tablet (5 mg total) by mouth daily.  Marland Kitchen esomeprazole (NEXIUM) 40 MG capsule Take 1 capsule (40 mg total) by mouth daily.  Marland Kitchen FLUoxetine (PROZAC) 20 MG capsule Take 1 capsule (20 mg total) by mouth daily.  . hydrochlorothiazide (MICROZIDE) 12.5 MG capsule Take 1 capsule (12.5 mg total) by mouth daily.  . montelukast (SINGULAIR) 10 MG tablet Take 1 tablet (10 mg total) by mouth at bedtime.  Marland Kitchen QUEtiapine (SEROQUEL) 50 MG tablet Take 1 tablet (50 mg total) by mouth at bedtime.  Marland Kitchen  zolpidem (AMBIEN CR) 12.5 MG CR tablet Take 1 tablet (12.5 mg total) by mouth at bedtime as needed for sleep.  . [DISCONTINUED] amLODipine (NORVASC) 5 MG tablet TAKE 1 TABLET BY MOUTH DAILY.  . [DISCONTINUED] esomeprazole (NEXIUM) 40 MG capsule Take 1 capsule (40 mg total) by mouth daily.  . [DISCONTINUED] FLUoxetine (PROZAC) 20 MG capsule Take 1 capsule (20 mg total) by mouth daily.  . [DISCONTINUED] hydrochlorothiazide (MICROZIDE) 12.5 MG capsule Take 1 capsule (12.5 mg total) by mouth daily.  . [DISCONTINUED] montelukast (SINGULAIR) 10 MG tablet TAKE 1 TABLET BY MOUTH AT BEDTIME  . [DISCONTINUED] QUEtiapine (SEROQUEL) 50 MG tablet Take 1 tablet (50 mg total) by mouth at bedtime.  . [DISCONTINUED] zolpidem (AMBIEN CR) 12.5 MG CR tablet Take 1 tablet (12.5 mg total) by mouth at bedtime as needed for sleep.   No facility-administered encounter medications on file as of 08/09/2019.    Allergies  Allergen Reactions  . Guaifenesin & Derivatives Palpitations  . Oxycodone Nausea And Vomiting    Review of Systems  Constitutional: Negative for activity change, appetite change, chills, fatigue and fever.  HENT: Negative.   Eyes: Negative.   Respiratory: Negative for cough, chest tightness and shortness of breath.   Cardiovascular: Negative for chest pain, palpitations and leg swelling.  Gastrointestinal:  Negative for blood in stool, constipation, diarrhea, nausea and vomiting.  Endocrine: Negative.   Genitourinary: Negative for dysuria, frequency and urgency.  Musculoskeletal: Negative for arthralgias and myalgias.  Skin: Negative.   Allergic/Immunologic: Negative.   Neurological: Negative for dizziness and headaches.  Hematological: Negative.   Psychiatric/Behavioral: Positive for agitation, decreased concentration, dysphoric mood and sleep disturbance. Negative for behavioral problems, confusion, hallucinations, self-injury and suicidal ideas. The patient is nervous/anxious. The patient is not hyperactive.   All other systems reviewed and are negative.        Observations/Objective: No vital signs or physical exam, this was a telephone or virtual health encounter.  Pt alert and oriented, answers all questions appropriately, and able to speak in full sentences.    Assessment and Plan: Samel was seen today for medical management of chronic issues.  Diagnoses and all orders for this visit:  Seasonal allergic rhinitis due to pollen Doing well on below, will continue.  -     montelukast (SINGULAIR) 10 MG tablet; Take 1 tablet (10 mg total) by mouth at bedtime.  Essential hypertension BP well controlled. Changes were not made in regimen today. Goal BP is 130/80. Pt aware to report any persistent high or low readings. DASH diet and exercise encouraged. Exercise at least 150 minutes per week and increase as tolerated. Goal BMI > 25. Stress management encouraged. Avoid nicotine and tobacco product use. Avoid excessive alcohol and NSAID's. Avoid more than 2000 mg of sodium daily. Medications as prescribed. Follow up as scheduled.  -     amLODipine (NORVASC) 5 MG tablet; Take 1 tablet (5 mg total) by mouth daily. -     hydrochlorothiazide (MICROZIDE) 12.5 MG capsule; Take 1 capsule (12.5 mg total) by mouth daily.  GAD (generalized anxiety disorder) Doing well on below, will continue.  -      FLUoxetine (PROZAC) 20 MG capsule; Take 1 capsule (20 mg total) by mouth daily. -     QUEtiapine (SEROQUEL) 50 MG tablet; Take 1 tablet (50 mg total) by mouth at bedtime.  MDD (major depressive disorder), single episode, moderate ([facility]) Doing well on below, will continue.  -     FLUoxetine (PROZAC) 20 MG  capsule; Take 1 capsule (20 mg total) by mouth daily. -     QUEtiapine (SEROQUEL) 50 MG tablet; Take 1 tablet (50 mg total) by mouth at bedtime.  Insomnia due to anxiety and fear Doing well on below, will continue.  -     QUEtiapine (SEROQUEL) 50 MG tablet; Take 1 tablet (50 mg total) by mouth at bedtime. -     zolpidem (AMBIEN CR) 12.5 MG CR tablet; Take 1 tablet (12.5 mg total) by mouth at bedtime as needed for sleep.  Gastroesophageal reflux disease without esophagitis No red flags present. Diet discussed. Avoid fried, spicy, fatty, greasy, and acidic foods. Avoid caffeine, nicotine, and alcohol. Do not eat 2-3 hours before bedtime and stay upright for at least 1-2 hours after eating. Eat small frequent meals. Avoid NSAID's like motrin and aleve. Medications as prescribed. Report any new or worsening symptoms. Follow up as discussed or sooner if needed.   -     esomeprazole (NEXIUM) 40 MG capsule; Take 1 capsule (40 mg total) by mouth daily.     Follow Up Instructions: Return in about 6 months (around 02/09/2020), or if symptoms worsen or fail to improve, for chronic follow up.    I discussed the assessment and treatment plan with the patient. The patient was provided an opportunity to ask questions and all were answered. The patient agreed with the plan and demonstrated an understanding of the instructions.   The patient was advised to call back or seek an in-person evaluation if the symptoms worsen or if the condition fails to improve as anticipated.  The above assessment and management plan was discussed with the patient. The patient verbalized understanding of and has agreed to  the management plan. Patient is aware to call the clinic if they develop any new symptoms or if symptoms persist or worsen. Patient is aware when to return to the clinic for a follow-up visit. Patient educated on when it is appropriate to go to the emergency department.    I provided 15 minutes of non-face-to-face time during this encounter. The call started at 1435. The call ended at 1450. The other time was used for coordination of care.    Kari Baars, FNP-C [facility] [address] [place], [place] [address] [phone] 08/09/2019

## 2019-08-11 MED FILL — MONTELUKAST SOD 10 MG TAB: 10 | 90 days supply | Qty: 90 | Fill #1

## 2019-09-10 MED FILL — AMLODIPINE BESYLATE 5 MG TA: 5 | 90 days supply | Qty: 90 | Fill #0

## 2019-09-10 MED FILL — HYDROCHLOROTHIAZIDE 12.5 MG: 12.5 | 90 days supply | Qty: 90 | Fill #0

## 2019-09-29 ENCOUNTER — Other Ambulatory Visit: Payer: Self-pay | Admitting: Family

## 2019-09-29 DIAGNOSIS — F5105 Insomnia due to other mental disorder: Secondary | ICD-10-CM

## 2019-09-29 MED ORDER — ZOLPIDEM TARTRATE ER 12.5 MG PO TBCR: 12.5000 mg | EXTENDED_RELEASE_TABLET | Freq: Every evening | ORAL | 5 refills | Status: DC | PRN

## 2019-10-17 MED FILL — ESOMEPRAZOLE MAG DR 40 MG C: 40 | 90 days supply | Qty: 90 | Fill #1

## 2019-10-31 ENCOUNTER — Other Ambulatory Visit: Payer: Self-pay

## 2019-10-31 DIAGNOSIS — F411 Generalized anxiety disorder: Secondary | ICD-10-CM

## 2019-10-31 DIAGNOSIS — F321 Major depressive disorder, single episode, moderate: Secondary | ICD-10-CM

## 2019-10-31 MED ORDER — FLUOXETINE HCL 20 MG PO CAPS: 20.0000 mg | ORAL_CAPSULE | Freq: Every day | ORAL | 11 refills | Status: DC

## 2019-10-31 MED FILL — MONTELUKAST SOD 10 MG TAB: 10 | 90 days supply | Qty: 90 | Fill #0

## 2019-10-31 MED FILL — FLUoxetine HCL 20 MG CAPS: 20 | 30 days supply | Qty: 30 | Fill #0

## 2019-11-12 MED FILL — QUETIAPINE FUMARATE 50 MG T: 50 | 90 days supply | Qty: 90 | Fill #1

## 2019-11-29 ENCOUNTER — Other Ambulatory Visit: Payer: Self-pay

## 2019-11-29 DIAGNOSIS — F411 Generalized anxiety disorder: Secondary | ICD-10-CM

## 2019-11-29 DIAGNOSIS — F321 Major depressive disorder, single episode, moderate: Secondary | ICD-10-CM

## 2019-11-29 MED ORDER — FLUOXETINE HCL 20 MG PO CAPS: 20.0000 mg | ORAL_CAPSULE | Freq: Every day | ORAL | 3 refills | Status: DC

## 2020-01-11 MED FILL — ESOMEPRAZOLE MAG DR 40 MG C: 40 | 90 days supply | Qty: 90 | Fill #0

## 2020-02-04 ENCOUNTER — Encounter: Payer: Self-pay | Admitting: Family Medicine

## 2020-02-04 ENCOUNTER — Telehealth (INDEPENDENT_AMBULATORY_CARE_PROVIDER_SITE_OTHER): Payer: No Typology Code available for payment source | Admitting: Family Medicine

## 2020-02-04 DIAGNOSIS — R05 Cough: Secondary | ICD-10-CM

## 2020-02-04 DIAGNOSIS — Z20822 Contact with and (suspected) exposure to covid-19: Secondary | ICD-10-CM

## 2020-02-04 DIAGNOSIS — R059 Cough, unspecified: Secondary | ICD-10-CM

## 2020-02-04 MED ORDER — ONDANSETRON 4 MG PO TBDP: 4.0000 mg | ORAL_TABLET | Freq: Three times a day (TID) | ORAL | 0 refills | Status: DC | PRN

## 2020-02-04 MED ORDER — ALBUTEROL SULFATE HFA 108 (90 BASE) MCG/ACT IN AERS: 2.0000 | INHALATION_SPRAY | Freq: Four times a day (QID) | RESPIRATORY_TRACT | 0 refills | Status: DC | PRN

## 2020-02-04 MED ORDER — PREDNISONE 10 MG (21) PO TBPK: ORAL_TABLET | ORAL | 0 refills | Status: DC

## 2020-02-04 NOTE — Addendum Note (Signed)
Addended by: Prescott Gum on: 02/04/2020 02:43 PM   Modules accepted: Orders

## 2020-02-04 NOTE — Progress Notes (Signed)
Virtual Visit via Video note  I connected with Jacob Benson on 02/04/20 at 2:08 PM by video and verified that I am speaking with the correct person using two identifiers. Jacob Benson is currently located at home and his wife is currently with him during visit. The provider, Gwenlyn Fudge, FNP is located in their office at time of visit.  I discussed the limitations, risks, security and privacy concerns of performing an evaluation and management service by video and the availability of in person appointments. I also discussed with the patient that there may be a patient responsible charge related to this service. The patient expressed understanding and agreed to proceed.  Subjective: PCP: Sonny Masters, FNP (Inactive)  Chief Complaint  Patient presents with  . Cough   Patient complains of cough, head congestion, headache, sore throat, ear pain/pressure, facial pain/pressure, postnasal drainage, shortness of breath, abdominal pain and fatigue. Onset of symptoms was 1 day ago, gradually worsening since that time. He is drinking plenty of fluids and urinating per his usual. Evaluation to date: none. Treatment to date: none. He has a history of allergies. He does not smoke. Patient has had recent close contact with someone who has tested positive for COVID-19.    ROS: Per HPI  Current Outpatient Medications:  .  amLODipine (NORVASC) 5 MG tablet, Take 1 tablet (5 mg total) by mouth daily., Disp: 90 tablet, Rfl: 3 .  esomeprazole (NEXIUM) 40 MG capsule, Take 1 capsule (40 mg total) by mouth daily., Disp: 90 capsule, Rfl: 3 .  FLUoxetine (PROZAC) 20 MG capsule, Take 1 capsule (20 mg total) by mouth daily., Disp: 30 capsule, Rfl: 3 .  hydrochlorothiazide (MICROZIDE) 12.5 MG capsule, Take 1 capsule (12.5 mg total) by mouth daily., Disp: 90 capsule, Rfl: 3 .  montelukast (SINGULAIR) 10 MG tablet, Take 1 tablet (10 mg total) by mouth at bedtime., Disp: 90 tablet, Rfl: 3 .  QUEtiapine  (SEROQUEL) 50 MG tablet, Take 1 tablet (50 mg total) by mouth at bedtime., Disp: 90 tablet, Rfl: 3 .  zolpidem (AMBIEN CR) 12.5 MG CR tablet, Take 1 tablet (12.5 mg total) by mouth at bedtime as needed for sleep., Disp: 30 tablet, Rfl: 5  Allergies  Allergen Reactions  . Guaifenesin & Derivatives Palpitations  . Oxycodone Nausea And Vomiting   Past Medical History:  Diagnosis Date  . Calcium kidney stones 2007  . Hyperlipidemia   . Hypertension   . Olecranon fracture    left  . Shingles 2006    Observations/Objective: Physical Exam Constitutional:      General: He is not in acute distress.    Appearance: Normal appearance. He is not ill-appearing or toxic-appearing.  Eyes:     General: No scleral icterus.       Right eye: No discharge.        Left eye: No discharge.     Conjunctiva/sclera: Conjunctivae normal.  Pulmonary:     Effort: Pulmonary effort is normal. No respiratory distress.  Neurological:     Mental Status: He is alert and oriented to person, place, and time.  Psychiatric:        Mood and Affect: Mood normal.        Behavior: Behavior normal.        Thought Content: Thought content normal.        Judgment: Judgment normal.    Assessment and Plan: 1. Suspected COVID-19 virus infection - Discussed symptom management and symptoms that should prompt  him to go to the ER. Encouraged him to start vitamin C, vitamin D, and zinc.  - ondansetron (ZOFRAN ODT) 4 MG disintegrating tablet; Take 1 tablet (4 mg total) by mouth every 8 (eight) hours as needed for nausea or vomiting.  Dispense: 30 tablet; Refill: 0 - albuterol (VENTOLIN HFA) 108 (90 Base) MCG/ACT inhaler; Inhale 2 puffs into the lungs every 6 (six) hours as needed.  Dispense: 18 g; Refill: 0 - predniSONE (STERAPRED UNI-PAK 21 TAB) 10 MG (21) TBPK tablet; As directed x 6 days  Dispense: 21 tablet; Refill: 0  2. Cough - Novel Coronavirus, NAA ([facility]); Future   Follow Up Instructions:   I discussed the  assessment and treatment plan with the patient. The patient was provided an opportunity to ask questions and all were answered. The patient agreed with the plan and demonstrated an understanding of the instructions.   The patient was advised to call back or seek an in-person evaluation if the symptoms worsen or if the condition fails to improve as anticipated.  The above assessment and management plan was discussed with the patient. The patient verbalized understanding of and has agreed to the management plan. Patient is aware to call the clinic if symptoms persist or worsen. Patient is aware when to return to the clinic for a follow-up visit. Patient educated on when it is appropriate to go to the emergency department.   Time call ended: 2:11 PM  I provided 5 minutes of face-to-face time during this encounter.   [name], MSN, APRN, FNP-C [facility] 02/04/20

## 2020-02-06 LAB — SARS-COV-2, NAA 2 DAY TAT

## 2020-02-06 LAB — NOVEL CORONAVIRUS, NAA: SARS-CoV-2, NAA: NOT DETECTED

## 2020-02-08 ENCOUNTER — Ambulatory Visit: Payer: No Typology Code available for payment source | Admitting: Family Medicine

## 2020-02-12 MED FILL — HYDROCHLOROTHIAZIDE 12.5 MG: 12.5 | 90 days supply | Qty: 90 | Fill #1

## 2020-02-12 MED FILL — QUETIAPINE FUMARATE 50 MG T: 50 | 90 days supply | Qty: 90 | Fill #2

## 2020-02-12 MED FILL — MONTELUKAST SOD 10 MG TAB: 10 | 90 days supply | Qty: 90 | Fill #1

## 2020-02-19 ENCOUNTER — Other Ambulatory Visit: Payer: Self-pay

## 2020-02-19 ENCOUNTER — Encounter: Payer: Self-pay | Admitting: Family Medicine

## 2020-02-19 ENCOUNTER — Ambulatory Visit (INDEPENDENT_AMBULATORY_CARE_PROVIDER_SITE_OTHER): Payer: No Typology Code available for payment source | Admitting: Family Medicine

## 2020-02-19 VITALS — BP 117/73 | HR 65 | Temp 97.9°F | Resp 20 | Ht 70.0 in | Wt 189.0 lb

## 2020-02-19 DIAGNOSIS — N453 Epididymo-orchitis: Secondary | ICD-10-CM

## 2020-02-19 MED ORDER — CEFTRIAXONE SODIUM 1 G IJ SOLR
1.0000 g | Freq: Once | INTRAMUSCULAR | Status: AC
  Administered 2020-02-19: 1 g via INTRAMUSCULAR

## 2020-02-19 MED ORDER — DOXYCYCLINE HYCLATE 100 MG PO CAPS: 100.0000 mg | ORAL_CAPSULE | Freq: Two times a day (BID) | ORAL | 0 refills | Status: DC

## 2020-02-19 NOTE — Progress Notes (Signed)
Chief Complaint  Patient presents with  . Groin and testicle pain    HPI  Patient presents today for several weeks of pain in the right testicle.  This is been increasing over the last few days.  He has noticed it radiating up into the right mid abdomen area as well.  It does not radiate to the flank or back.  He does feel it in the perineal area as well.  It is moderately severe.  He does not have dysuria.  However he is having pain with sex.  He has had no fever chills or sweats with this. Vasa ectomy performed in April of this year. PMH: Smoking status noted ROS: Per HPI  Objective: BP 117/73   Pulse 65   Temp 97.9 F (36.6 C) (Temporal)   Resp 20   Ht 5\' 10"  (1.778 m)   Wt 189 lb (85.7 kg)   SpO2 99%   BMI 27.12 kg/m  Gen: NAD, alert, cooperative with exam HEENT: NCAT, EOMI, PERRL CV: RRR, good S1/S2, no murmur Resp: CTABL, no wheezes, non-labored Abd: SNTND, the right testicle is 2+ swollen and mildly tender.  He has some edema and enlargement of the vasa at the area of the recent vasectomy on the right as compared to the left. Ext: No edema, warm Neuro: Alert and oriented, No gross deficits  Assessment and plan:  1. Epididymoorchitis     Meds ordered this encounter  Medications  . cefTRIAXone (ROCEPHIN) injection 1 g  . doxycycline (VIBRAMYCIN) 100 MG capsule    Sig: Take 1 capsule (100 mg total) by mouth 2 (two) times daily.    Dispense:  28 capsule    Refill:  0    No orders of the defined types were placed in this encounter.   Follow up as needed.  , MD

## 2020-02-25 NOTE — Addendum Note (Signed)
Addended by: Mechele Claude on: 02/25/2020 04:32 PM   Modules accepted: Orders

## 2020-02-27 ENCOUNTER — Ambulatory Visit (HOSPITAL_COMMUNITY)
Admission: RE | Admit: 2020-02-27 | Discharge: 2020-02-27 | Disposition: A | Discharge status: Home / Self Care | Payer: No Typology Code available for payment source | Source: Ambulatory Visit | Attending: Family Medicine | Admitting: Family Medicine

## 2020-02-27 ENCOUNTER — Other Ambulatory Visit: Payer: Self-pay

## 2020-02-27 DIAGNOSIS — N453 Epididymo-orchitis: Secondary | ICD-10-CM | POA: Diagnosis not present

## 2020-03-13 ENCOUNTER — Other Ambulatory Visit: Payer: No Typology Code available for payment source

## 2020-03-13 DIAGNOSIS — E789 Disorder of lipoprotein metabolism, unspecified: Secondary | ICD-10-CM

## 2020-03-13 DIAGNOSIS — I10 Essential (primary) hypertension: Secondary | ICD-10-CM

## 2020-03-13 MED FILL — AMLODIPINE BESYLATE 5 MG TA: 5 | 90 days supply | Qty: 90 | Fill #0

## 2020-03-14 ENCOUNTER — Other Ambulatory Visit: Payer: Self-pay

## 2020-03-14 ENCOUNTER — Encounter: Payer: Self-pay | Admitting: Family Medicine

## 2020-03-14 ENCOUNTER — Ambulatory Visit (INDEPENDENT_AMBULATORY_CARE_PROVIDER_SITE_OTHER): Payer: No Typology Code available for payment source | Admitting: Family Medicine

## 2020-03-14 VITALS — BP 142/85 | HR 65 | Temp 97.0°F | Ht 70.0 in | Wt 189.0 lb

## 2020-03-14 DIAGNOSIS — K219 Gastro-esophageal reflux disease without esophagitis: Secondary | ICD-10-CM

## 2020-03-14 DIAGNOSIS — F5105 Insomnia due to other mental disorder: Secondary | ICD-10-CM

## 2020-03-14 DIAGNOSIS — N410 Acute prostatitis: Secondary | ICD-10-CM

## 2020-03-14 DIAGNOSIS — I10 Essential (primary) hypertension: Secondary | ICD-10-CM

## 2020-03-14 DIAGNOSIS — F409 Phobic anxiety disorder, unspecified: Secondary | ICD-10-CM

## 2020-03-14 DIAGNOSIS — E785 Hyperlipidemia, unspecified: Secondary | ICD-10-CM | POA: Diagnosis not present

## 2020-03-14 LAB — CMP14+EGFR
ALT: 17 IU/L (ref 0–44)
AST: 17 IU/L (ref 0–40)
Albumin/Globulin Ratio: 2 (ref 1.2–2.2)
Albumin: 4.3 g/dL (ref 4.0–5.0)
Alkaline Phosphatase: 81 IU/L (ref 44–121)
BUN/Creatinine Ratio: 18 (ref 9–20)
BUN: 20 mg/dL (ref 6–20)
Bilirubin Total: 0.4 mg/dL (ref 0.0–1.2)
CO2: 28 mmol/L (ref 20–29)
Calcium: 9.2 mg/dL (ref 8.7–10.2)
Chloride: 103 mmol/L (ref 96–106)
Creatinine, Ser: 1.13 mg/dL (ref 0.76–1.27)
GFR calc Af Amer: 98 mL/min/{1.73_m2} (ref >59)
GFR calc non Af Amer: 85 mL/min/{1.73_m2} (ref >59)
Globulin, Total: 2.1 g/dL (ref 1.5–4.5)
Glucose: 82 mg/dL (ref 65–99)
Potassium: 3.8 mmol/L (ref 3.5–5.2)
Sodium: 144 mmol/L (ref 134–144)
Total Protein: 6.4 g/dL (ref 6.0–8.5)

## 2020-03-14 LAB — CBC WITH DIFFERENTIAL/PLATELET
Basophils Absolute: 0 10*3/uL (ref 0.0–0.2)
Basos: 1 %
EOS (ABSOLUTE): 0 10*3/uL (ref 0.0–0.4)
Eos: 1 %
Hematocrit: 46.8 % (ref 37.5–51.0)
Hemoglobin: 15.3 g/dL (ref 13.0–17.7)
Immature Grans (Abs): 0 10*3/uL (ref 0.0–0.1)
Immature Granulocytes: 0 %
Lymphocytes Absolute: 1.6 10*3/uL (ref 0.7–3.1)
Lymphs: 23 %
MCH: 29.2 pg (ref 26.6–33.0)
MCHC: 32.7 g/dL (ref 31.5–35.7)
MCV: 89 fL (ref 79–97)
Monocytes Absolute: 0.5 10*3/uL (ref 0.1–0.9)
Monocytes: 8 %
Neutrophils Absolute: 4.7 10*3/uL (ref 1.4–7.0)
Neutrophils: 67 %
Platelets: 266 10*3/uL (ref 150–450)
RBC: 5.24 x10E6/uL (ref 4.14–5.80)
RDW: 13.6 % (ref 11.6–15.4)
WBC: 7 10*3/uL (ref 3.4–10.8)

## 2020-03-14 LAB — LIPID PANEL
Chol/HDL Ratio: 6.3 ratio — ABNORMAL HIGH (ref 0.0–5.0)
Cholesterol, Total: 234 mg/dL — ABNORMAL HIGH (ref 100–199)
HDL: 37 mg/dL — ABNORMAL LOW (ref >39)
LDL Chol Calc (NIH): 162 mg/dL — ABNORMAL HIGH (ref 0–99)
Triglycerides: 191 mg/dL — ABNORMAL HIGH (ref 0–149)
VLDL Cholesterol Cal: 35 mg/dL (ref 5–40)

## 2020-03-14 MED ORDER — CIPROFLOXACIN HCL 500 MG PO TABS: 500.0000 mg | ORAL_TABLET | Freq: Two times a day (BID) | ORAL | 0 refills | Status: DC

## 2020-03-14 MED ORDER — PRAVASTATIN SODIUM 20 MG PO TABS: 20.0000 mg | ORAL_TABLET | Freq: Every day | ORAL | 3 refills | Status: DC

## 2020-03-14 MED ORDER — ZOLPIDEM TARTRATE ER 12.5 MG PO TBCR: 12.5000 mg | EXTENDED_RELEASE_TABLET | Freq: Every evening | ORAL | 5 refills | Status: DC | PRN

## 2020-03-14 NOTE — Progress Notes (Signed)
BP (!) 142/85   Pulse 65   Temp (!) 97 F (36.1 C)   Ht 5' 10"  (1.778 m)   Wt 189 lb (85.7 kg)   SpO2 98%   BMI 27.12 kg/m    Subjective:   Patient ID: Jacob Benson, male    DOB: 05/17/1986, 34 y.o.   MRN: [id number]  HPI: AMIRR ACHORD is a 34 y.o. male presenting on 03/14/2020 for Medical Management of Chronic Issues   HPI Hypertension Patient is currently on amlodipine and hydrochlorothiazide, and their blood pressure today is 142/85. Patient denies any lightheadedness or dizziness. Patient denies headaches, blurred vision, chest pains, shortness of breath, or weakness. Denies any side effects from medication and is content with current medication.   Hyperlipidemia Patient is coming in for recheck of his hyperlipidemia. The patient is currently taking pravastatin. They deny any issues with myalgias or history of liver damage from it. They deny any focal numbness or weakness or chest pain.   Anxiety and insomnia Patient is coming in for anxiety and insomnia Current rx-Ambien 12.5 mg nightly as needed # meds rx-30 Effectiveness of current meds-works well  Adverse reactions form meds-none  Pill count performed-No Last drug screen -02/13/2019 ( high risk q62m moderate risk q669mlow risk yearly ) Urine drug screen today- No Was the N[place]ick[place]oneviewed- yes  If yes were their any concerning findings? - no  No flowsheet data found.   Controlled substance contract signed on: 9/29/ 2020  Patient also comes in complaining of right lower groin pain and pain down into his testicle.  He complains of pain with intercourse and pain with movement and pain as the day goes on if he is on his feet.  He says when he is at rest it does not hurt as much.  He denies any blood in his ejaculate.  He denies any urinary burning or pain when he urinates.  He denies any bowel issues.  He was treated with an antibiotic for possible prostatitis.  Relevant past medical, surgical, family and  social history reviewed and updated as indicated. Interim medical history since our last visit reviewed. Allergies and medications reviewed and updated.  Review of Systems  Constitutional: Negative for chills and fever.  Eyes: Negative for visual disturbance.  Respiratory: Negative for shortness of breath and wheezing.   Cardiovascular: Negative for chest pain and leg swelling.  Gastrointestinal: Positive for abdominal pain.  Genitourinary: Positive for testicular pain. Negative for decreased urine volume and urgency.  Musculoskeletal: Negative for back pain and gait problem.  Skin: Negative for rash.  All other systems reviewed and are negative.   Per HPI unless specifically indicated above   Allergies as of 03/14/2020      Reactions   Guaifenesin & Derivatives Palpitations   Oxycodone Nausea And Vomiting      Medication List       Accurate as of March 14, 2020  4:29 PM. If you have any questions, ask your nurse or doctor.        STOP taking these medications   doxycycline 100 MG capsule Commonly known as: Vibramycin Stopped by: JoFransisca Kaufmannettinger, MD     TAKE these medications   albuterol 108 (90 Base) MCG/ACT inhaler Commonly known as: VENTOLIN HFA Inhale 2 puffs into the lungs every 6 (six) hours as needed.   amLODipine 5 MG tablet Commonly known as: NORVASC Take 1 tablet (5 mg total) by mouth daily.   ciprofloxacin 500 MG tablet  Commonly known as: Cipro Take 1 tablet (500 mg total) by mouth 2 (two) times daily. Started by: Worthy Rancher, MD   esomeprazole 40 MG capsule Commonly known as: NEXIUM Take 1 capsule (40 mg total) by mouth daily.   FLUoxetine 20 MG capsule Commonly known as: PROZAC Take 1 capsule (20 mg total) by mouth daily.   hydrochlorothiazide 12.5 MG capsule Commonly known as: MICROZIDE Take 1 capsule (12.5 mg total) by mouth daily.   montelukast 10 MG tablet Commonly known as: SINGULAIR Take 1 tablet (10 mg total) by mouth at  bedtime.   pravastatin 20 MG tablet Commonly known as: PRAVACHOL Take 1 tablet (20 mg total) by mouth at bedtime. Started by: Fransisca Kaufmann Hulbert Branscome, MD   QUEtiapine 50 MG tablet Commonly known as: SEROquel Take 1 tablet (50 mg total) by mouth at bedtime.   zolpidem 12.5 MG CR tablet Commonly known as: Ambien CR Take 1 tablet (12.5 mg total) by mouth at bedtime as needed for sleep. Start taking on: March 30, 2020 What changed: These instructions start on March 30, 2020. If you are unsure what to do until then, ask your doctor or other care provider. Changed by: Fransisca Kaufmann Arnez Stoneking, MD        Objective:   BP (!) 142/85   Pulse 65   Temp (!) 97 F (36.1 C)   Ht 5' 10"  (1.778 m)   Wt 189 lb (85.7 kg)   SpO2 98%   BMI 27.12 kg/m   Wt Readings from Last 3 Encounters:  03/14/20 189 lb (85.7 kg)  02/19/20 189 lb (85.7 kg)  07/27/19 200 lb 12.8 oz (91.1 kg)    Physical Exam Vitals and nursing note reviewed.  Constitutional:      General: He is not in acute distress.    Appearance: He is well-developed. He is not diaphoretic.  Eyes:     General: No scleral icterus.    Conjunctiva/sclera: Conjunctivae normal.  Neck:     Thyroid: No thyromegaly.  Cardiovascular:     Rate and Rhythm: Normal rate and regular rhythm.     Heart sounds: Normal heart sounds. No murmur heard.   Pulmonary:     Effort: Pulmonary effort is normal. No respiratory distress.     Breath sounds: Normal breath sounds. No wheezing.  Genitourinary:    Testes:        Right: Tenderness present. Mass, swelling, testicular hydrocele or varicocele not present. Right testis is descended.        Left: Mass, tenderness, swelling, testicular hydrocele or varicocele not present. Left testis is descended.     Epididymis:     Right: Tenderness present.     Prostate: Tender. Not enlarged and no nodules present.     Rectum: Normal.  Musculoskeletal:     Cervical back: Neck supple.  Lymphadenopathy:      Cervical: No cervical adenopathy.  Skin:    General: Skin is warm and dry.     Findings: No rash.  Neurological:     Mental Status: He is alert and oriented to person, place, and time.     Coordination: Coordination normal.  Psychiatric:        Behavior: Behavior normal.     Results for orders placed or performed in visit on 03/13/20  CBC with Differential/Platelet  Result Value Ref Range   WBC 7.0 3.4 - 10.8 x10E3/uL   RBC 5.24 4.14 - 5.80 x10E6/uL   Hemoglobin 15.3 13.0 - 17.7 g/dL  Hematocrit 46.8 37.5 - 51.0 %   MCV 89 79 - 97 fL   MCH 29.2 26.6 - 33.0 pg   MCHC 32.7 31 - 35 g/dL   RDW 13.6 11.6 - 15.4 %   Platelets 266 150 - 450 x10E3/uL   Neutrophils 67 Not Estab. %   Lymphs 23 Not Estab. %   Monocytes 8 Not Estab. %   Eos 1 Not Estab. %   Basos 1 Not Estab. %   Neutrophils Absolute 4.7 1.40 - 7.00 x10E3/uL   Lymphocytes Absolute 1.6 0 - 3 x10E3/uL   Monocytes Absolute 0.5 0 - 0 x10E3/uL   EOS (ABSOLUTE) 0.0 0.0 - 0.4 x10E3/uL   Basophils Absolute 0.0 0 - 0 x10E3/uL   Immature Granulocytes 0 Not Estab. %   Immature Grans (Abs) 0.0 0.0 - 0.1 x10E3/uL  CMP14+EGFR  Result Value Ref Range   Glucose 82 65 - 99 mg/dL   BUN 20 6 - 20 mg/dL   Creatinine, Ser 1.13 0.76 - 1.27 mg/dL   GFR calc non Af Amer 85 >59 mL/min/1.73   GFR calc Af Amer 98 >59 mL/min/1.73   BUN/Creatinine Ratio 18 9 - 20   Sodium 144 134 - 144 mmol/L   Potassium 3.8 3.5 - 5.2 mmol/L   Chloride 103 96 - 106 mmol/L   CO2 28 20 - 29 mmol/L   Calcium 9.2 8.7 - 10.2 mg/dL   Total Protein 6.4 6.0 - 8.5 g/dL   Albumin 4.3 4.0 - 5.0 g/dL   Globulin, Total 2.1 1.5 - 4.5 g/dL   Albumin/Globulin Ratio 2.0 1.2 - 2.2   Bilirubin Total 0.4 0.0 - 1.2 mg/dL   Alkaline Phosphatase 81 44 - 121 IU/L   AST 17 0 - 40 IU/L   ALT 17 0 - 44 IU/L  Lipid panel  Result Value Ref Range   Cholesterol, Total 234 (H) 100 - 199 mg/dL   Triglycerides 191 (H) 0 - 149 mg/dL   HDL 37 (L) >39 mg/dL   VLDL Cholesterol Cal  35 5 - 40 mg/dL   LDL Chol Calc (NIH) 162 (H) 0 - 99 mg/dL   Chol/HDL Ratio 6.3 (H) 0.0 - 5.0 ratio    Assessment & Plan:   Problem List Items Addressed This Visit      Cardiovascular and Mediastinum   HTN (hypertension) - Primary   Relevant Medications   pravastatin (PRAVACHOL) 20 MG tablet     Digestive   GERD (gastroesophageal reflux disease)     Other   Insomnia due to anxiety and fear   Relevant Medications   zolpidem (AMBIEN CR) 12.5 MG CR tablet (Start on 03/30/2020)   Hyperlipidemia   Relevant Medications   pravastatin (PRAVACHOL) 20 MG tablet    Other Visit Diagnoses    Acute prostatitis       Relevant Medications   ciprofloxacin (CIPRO) 500 MG tablet      Will start pravastatin, also sent Cipro for prostatitis, continue Ambien.  Discussed dietary and lifestyle modification. Follow up plan: Return in about 6 months (around 09/12/2020), or if symptoms worsen or fail to improve, for Tension and GERD and insomnia and cholesterol recheck and physical.  Counseling provided for all of the vaccine components No orders of the defined types were placed in this encounter.   Caryl Pina, MD [facility] 03/14/2020, 4:29 PM

## 2020-03-17 ENCOUNTER — Telehealth: Payer: Self-pay | Admitting: *Deleted

## 2020-03-17 DIAGNOSIS — N410 Acute prostatitis: Secondary | ICD-10-CM

## 2020-03-17 MED ORDER — CIPROFLOXACIN HCL 500 MG PO TABS: 500.0000 mg | ORAL_TABLET | Freq: Two times a day (BID) | ORAL | 0 refills | Status: DC

## 2020-03-17 NOTE — Telephone Encounter (Signed)
rx sent to correct pharmacy per pt request and pt is aware.

## 2020-03-24 ENCOUNTER — Other Ambulatory Visit: Payer: Self-pay | Admitting: Family Medicine

## 2020-03-24 DIAGNOSIS — F411 Generalized anxiety disorder: Secondary | ICD-10-CM

## 2020-03-24 DIAGNOSIS — F321 Major depressive disorder, single episode, moderate: Secondary | ICD-10-CM

## 2020-04-14 ENCOUNTER — Telehealth: Payer: Self-pay | Admitting: *Deleted

## 2020-04-14 DIAGNOSIS — N410 Acute prostatitis: Secondary | ICD-10-CM

## 2020-04-14 MED ORDER — CIPROFLOXACIN HCL 500 MG PO TABS: 500.0000 mg | ORAL_TABLET | Freq: Two times a day (BID) | ORAL | 0 refills | Status: DC

## 2020-04-14 NOTE — Telephone Encounter (Signed)
Sent in another refill for Cipro and placed urology referral

## 2020-04-14 NOTE — Addendum Note (Signed)
Addended by: Arville Care on: 04/14/2020 03:46 PM   Modules accepted: Orders

## 2020-04-14 NOTE — Telephone Encounter (Signed)
Pts wife is aware 

## 2020-04-14 NOTE — Telephone Encounter (Signed)
Patient is still having groin pain, lower back pain and testicular pain that is worse with sexual intercourse.  Pain would like to know if he needs to do another round of antibiotic.  If so please send to [facility]

## 2020-04-29 ENCOUNTER — Other Ambulatory Visit: Payer: Self-pay | Admitting: Family Medicine

## 2020-04-29 MED ORDER — FLUCONAZOLE 150 MG PO TABS: 150.0000 mg | ORAL_TABLET | Freq: Once | ORAL | 0 refills | Status: AC

## 2020-05-12 MED FILL — HYDROCHLOROTHIAZIDE 12.5 MG: 12.5 | 90 days supply | Qty: 90 | Fill #2

## 2020-05-12 MED FILL — QUETIAPINE FUMARATE 50 MG T: 50 | 90 days supply | Qty: 90 | Fill #3

## 2020-05-12 MED FILL — MONTELUKAST SOD 10 MG TAB: 10 | 90 days supply | Qty: 90 | Fill #2

## 2020-05-12 MED FILL — ESOMEPRAZOLE MAG DR 40 MG C: 40 | 90 days supply | Qty: 90 | Fill #1

## 2020-05-27 ENCOUNTER — Other Ambulatory Visit: Payer: Self-pay

## 2020-05-27 ENCOUNTER — Other Ambulatory Visit: Payer: No Typology Code available for payment source

## 2020-05-27 DIAGNOSIS — R6889 Other general symptoms and signs: Secondary | ICD-10-CM

## 2020-05-27 LAB — VERITOR FLU A/B WAIVED
Influenza A: NEGATIVE
Influenza B: NEGATIVE

## 2020-05-28 LAB — NOVEL CORONAVIRUS, NAA: SARS-CoV-2, NAA: NOT DETECTED

## 2020-05-28 LAB — SARS-COV-2, NAA 2 DAY TAT

## 2020-06-04 ENCOUNTER — Ambulatory Visit (INDEPENDENT_AMBULATORY_CARE_PROVIDER_SITE_OTHER): Payer: No Typology Code available for payment source | Admitting: Family Medicine

## 2020-06-04 ENCOUNTER — Encounter: Payer: Self-pay | Admitting: Family Medicine

## 2020-06-04 DIAGNOSIS — U071 COVID-19: Secondary | ICD-10-CM

## 2020-06-04 MED ORDER — DEXAMETHASONE 2 MG PO TABS: ORAL_TABLET | ORAL | 0 refills | Status: DC

## 2020-06-04 MED ORDER — AMOXICILLIN-POT CLAVULANATE 875-125 MG PO TABS
1.0000 | ORAL_TABLET | Freq: Two times a day (BID) | ORAL | 0 refills | Status: DC
Start: 2020-06-04 — End: 2022-12-30

## 2020-06-04 NOTE — Progress Notes (Signed)
Virtual Visit via telephone Note  I connected with Jacob Benson on 06/04/20 at 1258 by telephone and verified that I am speaking with the correct person using two identifiers. Jacob Benson is currently located at home and patient are currently with her during visit. The provider, Elige Radon Murlin Schrieber, MD is located in their office at time of visit.  Call ended at 1309  I discussed the limitations, risks, security and privacy concerns of performing an evaluation and management service by telephone and the availability of in person appointments. I also discussed with the patient that there may be a patient responsible charge related to this service. The patient expressed understanding and agreed to proceed.   History and Present Illness: Patient has been diagnosed with Covid and has sore throat and congestion and shortness of breath and CP and loss of taste and smell.  His cough start 1 week ago.  Taste and smell left Sunday and he tested positive and has worsened since then.  He is using albuterol and using son's nebulizer.  He just feels weak and has diarrhea for 3 days.  He is trying to drink water and Gatorade and juice. He is using imodium and it helped diarrhea.   No diagnosis found.  Outpatient Encounter Medications as of 06/04/2020  Medication Sig  . albuterol (VENTOLIN HFA) 108 (90 Base) MCG/ACT inhaler Inhale 2 puffs into the lungs every 6 (six) hours as needed.  Marland Kitchen amLODipine (NORVASC) 5 MG tablet Take 1 tablet (5 mg total) by mouth daily.  . ciprofloxacin (CIPRO) 500 MG tablet Take 1 tablet (500 mg total) by mouth 2 (two) times daily.  Marland Kitchen esomeprazole (NEXIUM) 40 MG capsule Take 1 capsule (40 mg total) by mouth daily.  Marland Kitchen FLUoxetine (PROZAC) 20 MG capsule Take 1 capsule by mouth once daily  . hydrochlorothiazide (MICROZIDE) 12.5 MG capsule Take 1 capsule (12.5 mg total) by mouth daily.  . montelukast (SINGULAIR) 10 MG tablet Take 1 tablet (10 mg total) by mouth at bedtime.  .  pravastatin (PRAVACHOL) 20 MG tablet Take 1 tablet (20 mg total) by mouth at bedtime.  Marland Kitchen QUEtiapine (SEROQUEL) 50 MG tablet Take 1 tablet (50 mg total) by mouth at bedtime.  Marland Kitchen zolpidem (AMBIEN CR) 12.5 MG CR tablet Take 1 tablet (12.5 mg total) by mouth at bedtime as needed for sleep.   No facility-administered encounter medications on file as of 06/04/2020.    Review of Systems  Constitutional: Positive for fever. Negative for chills.  HENT: Positive for congestion, postnasal drip, rhinorrhea, sinus pressure, sneezing and sore throat. Negative for ear discharge, ear pain and voice change.   Eyes: Negative for pain, discharge, redness and visual disturbance.  Respiratory: Positive for cough and shortness of breath. Negative for wheezing.   Cardiovascular: Negative for chest pain and leg swelling.  Musculoskeletal: Negative for gait problem.  Skin: Negative for rash.  All other systems reviewed and are negative.   Observations/Objective: Patient sounds comfortable and in no acute distress  Assessment and Plan: Problem List Items Addressed This Visit   None   Visit Diagnoses    COVID-19 virus infection    -  Primary   Relevant Medications   amoxicillin-clavulanate (AUGMENTIN) 875-125 MG tablet   dexamethasone (DECADRON) 2 MG tablet      Recommended flonase and sent amoxicillin and dexamethasone for taper, if worsens or does not improve then please give [place] call. Follow up plan: Return if symptoms worsen or fail to improve.  I discussed the assessment and treatment plan with the patient. The patient was provided an opportunity to ask questions and all were answered. The patient agreed with the plan and demonstrated an understanding of the instructions.   The patient was advised to call back or seek an in-person evaluation if the symptoms worsen or if the condition fails to improve as anticipated.  The above assessment and management plan was discussed with the patient. The  patient verbalized understanding of and has agreed to the management plan. Patient is aware to call the clinic if symptoms persist or worsen. Patient is aware when to return to the clinic for a follow-up visit. Patient educated on when it is appropriate to go to the emergency department.    I provided 11 minutes of non-face-to-face time during this encounter.    Nils Pyle, MD

## 2020-06-12 MED FILL — AMLODIPINE BESYLATE 5 MG TA: 5 | 90 days supply | Qty: 90 | Fill #1

## 2020-06-22 ENCOUNTER — Other Ambulatory Visit: Payer: Self-pay | Admitting: Family Medicine

## 2020-06-22 DIAGNOSIS — F411 Generalized anxiety disorder: Secondary | ICD-10-CM

## 2020-06-22 DIAGNOSIS — F321 Major depressive disorder, single episode, moderate: Secondary | ICD-10-CM

## 2020-07-28 ENCOUNTER — Other Ambulatory Visit: Payer: Self-pay | Admitting: Family Medicine

## 2020-07-28 DIAGNOSIS — F321 Major depressive disorder, single episode, moderate: Secondary | ICD-10-CM

## 2020-07-28 DIAGNOSIS — F411 Generalized anxiety disorder: Secondary | ICD-10-CM

## 2020-07-28 NOTE — Telephone Encounter (Signed)
Dettinger. NTBS 30 days given 06/23/20 

## 2020-07-30 ENCOUNTER — Other Ambulatory Visit: Payer: Self-pay

## 2020-07-30 DIAGNOSIS — F321 Major depressive disorder, single episode, moderate: Secondary | ICD-10-CM

## 2020-07-30 DIAGNOSIS — F411 Generalized anxiety disorder: Secondary | ICD-10-CM

## 2020-07-30 DIAGNOSIS — K219 Gastro-esophageal reflux disease without esophagitis: Secondary | ICD-10-CM

## 2020-07-30 MED ORDER — ESOMEPRAZOLE MAGNESIUM 40 MG PO CPDR: 40.0000 mg | DELAYED_RELEASE_CAPSULE | Freq: Every day | ORAL | 1 refills | Status: DC

## 2020-07-30 MED ORDER — FLUOXETINE HCL 20 MG PO CAPS: 20.0000 mg | ORAL_CAPSULE | Freq: Every day | ORAL | 1 refills | Status: DC

## 2020-08-08 ENCOUNTER — Other Ambulatory Visit (HOSPITAL_BASED_OUTPATIENT_CLINIC_OR_DEPARTMENT_OTHER): Payer: Self-pay

## 2020-08-18 ENCOUNTER — Telehealth: Payer: Self-pay

## 2020-08-18 NOTE — Telephone Encounter (Signed)
(  Key: [id number])  This request has received a Favorable outcome.  Please note any additional information provided by [facility] at the bottom of this request.  Ambien  Dx: Insomnia  Tried and failed Hydroxyzine and Doxepin

## 2020-08-20 NOTE — Telephone Encounter (Signed)
PA Case: [id number], Status: Approved, Coverage Starts on: 08/18/2020 12:00:00 AM, Coverage Ends on: 08/18/2021 12:00:00 AM.  Pharmacy aware.

## 2020-09-15 ENCOUNTER — Other Ambulatory Visit: Payer: Self-pay

## 2020-09-15 ENCOUNTER — Ambulatory Visit: Payer: Medicaid Other | Attending: Urology | Admitting: Physical Therapy

## 2020-09-15 ENCOUNTER — Encounter: Payer: Self-pay | Admitting: Physical Therapy

## 2020-09-15 DIAGNOSIS — M545 Low back pain, unspecified: Secondary | ICD-10-CM | POA: Diagnosis not present

## 2020-09-15 DIAGNOSIS — R252 Cramp and spasm: Secondary | ICD-10-CM | POA: Diagnosis present

## 2020-09-15 DIAGNOSIS — G8929 Other chronic pain: Secondary | ICD-10-CM | POA: Diagnosis present

## 2020-09-15 NOTE — Therapy (Signed)
[facility] [address] [place], [place], [address] Phone: [phone]   Fax:  [phone]  Physical Therapy Evaluation  Patient Details  Name: Jacob Benson MRN: [id number] Date of Birth: 1986-05-09 Referring Provider (PT): Kasandra Knudsen, MD   Encounter Date: 09/15/2020   PT End of Session - 09/15/20 1710    Visit Number 1    Date for PT Re-Evaluation 10/31/20    Authorization Type Medicaid    PT Start Time 1448    PT Stop Time 1529    PT Time Calculation (min) 41 min    Activity Tolerance Patient tolerated treatment well;No increased pain    Behavior During Therapy WFL for tasks assessed/performed           Past Medical History:  Diagnosis Date  . Calcium kidney stones 2007  . Hyperlipidemia   . Hypertension   . Olecranon fracture    left  . Shingles 2006    Past Surgical History:  Procedure Laterality Date  . FRACTURE SURGERY    . I & D EXTREMITY Left 09/13/2015   Procedure: IRRIGATION AND DEBRIDEMENT EXTREMITY;  Surgeon: Tarry Kos, MD;  Location: [facility] OR;  Service: Orthopedics;  Laterality: Left;  . ORIF ELBOW FRACTURE Left 10/30/2015   Procedure: OPEN REDUCTION INTERNAL FIXATION (ORIF) LEFT OLECRANON FRACTURE;  Surgeon: Tarry Kos, MD;  Location: [facility] OR;  Service: Orthopedics;  Laterality: Left;  . ORIF HUMERUS FRACTURE Left 09/13/2015   Procedure: OPEN REDUCTION INTERNAL FIXATION (ORIF) ELBOW/OLECRANON AND I AND D;  Surgeon: Tarry Kos, MD;  Location: [facility] OR;  Service: Orthopedics;  Laterality: Left;  . TONSILLECTOMY    . VASECTOMY      There were no vitals filed for this visit.    Subjective Assessment - 09/15/20 1450    Subjective Patient presenting due to Rt sided lumbar pain which started 6-7 months ago. Had a vasectomy this time last year and attributed pain to that initially. Now feels that it is a muscle strain. Has been to MD and chiropractor with no relief. Patient works in  [employer] and has had additional labor due to boss being out with illness for extended period. Reports pain to be 8/10 at worst.    Limitations Sitting;Standing    How long can you sit comfortably? 5-10 minutes    How long can you stand comfortably? 20-30 minutes    Currently in Pain? Yes    Pain Score 2     Pain Location Back    Pain Orientation Lower    Pain Descriptors / Indicators Guarding;Sharp    Pain Type Chronic pain    Pain Onset More than a month ago    Pain Frequency Constant    Aggravating Factors  work, prolonged sitting    Pain Relieving Factors cold              [facility] PT Assessment - 09/15/20 0001      Assessment   Medical Diagnosis M54.50 (ICD-10-CM) - Low back pain, unspecified  R10.31 (ICD-10-CM) - Right lower quadrant pain    Referring Provider (PT) Kasandra Knudsen, MD    Onset Date/Surgical Date --   6-7 months ago   Hand Dominance Left    Next MD Visit None scheduled    Prior Therapy Yes - Lt UE      Precautions   Precautions None      Restrictions   Weight Bearing Restrictions No  Balance Screen   Has the patient fallen in the past 6 months No    Has the patient had a decrease in activity level because of a fear of falling?  No    Is the patient reluctant to leave their home because of a fear of falling?  No      Home [employer] information centre manager residence    Living Arrangements Spouse/significant other      Prior Function   Level of Independence Independent    Vocation Full time employment    Vocation Requirements lifting >50 lbs, prolonged standing, carrying      Cognition   Overall Cognitive Status Within Functional Limits for tasks assessed      Observation/Other Assessments   Focus on Therapeutic Outcomes (FOTO)  61      Functional Tests   Functional tests Squat      Squat   Comments Noting increased difficulty initiating hip flexion      Posture/Postural Control   Posture/Postural Control No significant  limitations      ROM / Strength   AROM / PROM / Strength AROM;Strength      AROM   Overall AROM Comments full lumbar ROM in all directions; increased pain with flexion, extension, and Rt rotation      Strength   Overall Strength Comments bil LE grossly 5/5; bil glute max 4+/5      Flexibility   Soft Tissue Assessment /Muscle Length yes    Hamstrings WFL bil LE      Special Tests    Special Tests Lumbar    Lumbar Tests FABER test;Straight Leg Raise      FABER test   findings Negative    Side Right      Straight Leg Raise   Findings Negative    Side  Right                      Objective measurements completed on examination: See above findings.               PT Education - 09/15/20 1604    Education Details Access Code: [email or web address]; posterior pelvic tilt, anti-rotation press, supine hip adduction isometric, double knee to chest, lower trunk rotation    Person(s) Educated Patient    Methods Explanation;Demonstration;Tactile cues;Verbal cues;Handout    Comprehension Verbalized understanding;Returned demonstration;Verbal cues required;Tactile cues required            PT Short Term Goals - 09/15/20 1612      PT SHORT TERM GOAL #1   Title Patient will be independent with HEP for continued progression at home.    Time 3    Period Weeks    Status New    Target Date 10/06/20      PT SHORT TERM GOAL #2   Title Patient will report no more than 4/10 pain at end of day for two consecutive visits to indicate improved activity tolerance    Baseline 8/10    Time 3    Period Weeks    Status New    Target Date 10/06/20             PT Long Term Goals - 09/15/20 1613      PT LONG TERM GOAL #1   Title Patient will be independent with advanced HEP for long term management of symptoms post D/C.    Time 6    Period Weeks    Status New  Target Date 10/27/20      PT LONG TERM GOAL #2   Title Patient will improve FOTO score to 67 to indicate  improved overall function.    Baseline 60    Time 6    Period Weeks    Status New    Target Date 10/27/20      PT LONG TERM GOAL #3   Title Patient will lift 50 lbs from floor level with good form x5 repetitions to more readily perform work duties.    Time 6    Status New    Target Date 10/27/20                  Plan - 09/15/20 1605    Clinical Impression Statement Patient is a 35 y/o male referred due to 6-7 month history of Rt sided low back pain. Patient has no significant documented medical history but does report that he has scoliosis. Activity limitations include prolonged sitting, prolonged standing, recreational activities, and work duties. Patient is unsure of initial injury. He demonstrates LE strength impairments as Rt knee flexion 4/5. Core strength impairments apparent as patient frequently uses valsalva manuever for standing activities and when asked to engage abdominals. He demonstrates no lumbar AROM deficits however he reports pain with flexion, extension, and Rt rotation. Also reporting increased pain when moved into Rt quadrant indicating possible facet joint involvement. Patient reports global tenderness with PAs to T11-L5. Patient requiring moderate verbal and tactile cues for proper from when performing functional lift from knee height as patient demonstrating increased thoracic kyphosis and minimal LE engagement. Would benefit from skilled therapeutic intervention to address impairments for decreased pain to more readily perform work duties and to resume recreational activity to maintain healthy lifestyle.    Examination-Activity Limitations Bend;Lift;Sit;Carry    Examination-Participation Restrictions Occupation;Community Activity    Stability/Clinical Decision Making Stable/Uncomplicated    Clinical Decision Making Low    Rehab Potential Excellent    PT Frequency 1x / week    PT Duration 6 weeks    PT Treatment/Interventions ADLs/Self Care Home  Management;Cryotherapy;Electrical Stimulation;Iontophoresis 4mg /ml Dexamethasone;Moist Heat;Traction;Functional mobility training;Therapeutic activities;Therapeutic exercise;Neuromuscular re-education;Patient/family education;Manual techniques;Dry needling;Taping;Spinal Manipulations;Joint Manipulations    PT Next Visit Plan review HEP; introduce DN if patient interested; progress core strengthening and functional training    PT Home Exercise Plan Access Code:    Consulted and Agree with Plan of Care Patient           Patient will benefit from skilled therapeutic intervention in order to improve the following deficits and impairments:  Decreased activity tolerance,Decreased strength,Increased muscle spasms,Improper body mechanics,Postural dysfunction,Pain  Visit Diagnosis: Chronic right-sided low back pain without sciatica - Plan: PT plan of care cert/re-cert  Cramp and spasm - Plan: PT plan of care cert/re-cert     Problem List Patient Active Problem List   Diagnosis Date Noted  . Hyperlipidemia 03/14/2020  . Controlled substance agreement signed 02/08/2019  . GAD (generalized anxiety disorder) 06/29/2018  . MDD (major depressive disorder), single episode, moderate (HCC) 06/29/2018  . Insomnia due to anxiety and fear 06/29/2018  . GERD (gastroesophageal reflux disease) 08/19/2016  . Allergic rhinitis 08/19/2016  . S/P ORIF (open reduction internal fixation) fracture 09/13/2015  . HTN (hypertension) 10/17/2012    12/17/2012 PT, DPT  09/15/20 5:21 PM   [facility] [address] [place], [place], [place] Phone: [phone]   Fax:  [phone]  Name: Jacob Benson MRN: Vladimir Faster  Date of Birth: 11/11/1985

## 2020-09-15 NOTE — Patient Instructions (Addendum)
Trigger Point Dry Needling  . What is Trigger Point Dry Needling (DN)? o DN is a physical therapy technique used to treat muscle pain and dysfunction. Specifically, DN helps deactivate muscle trigger points (muscle knots).  o A thin filiform needle is used to penetrate the skin and stimulate the underlying trigger point. The goal is for a local twitch response (LTR) to occur and for the trigger point to relax. No medication of any kind is injected during the procedure.   . What Does Trigger Point Dry Needling Feel Like?  o The procedure feels different for each individual patient. Some patients report that they do not actually feel the needle enter the skin and overall the process is not painful. Very mild bleeding may occur. However, many patients feel a deep cramping in the muscle in which the needle was inserted. This is the local twitch response.   Marland Kitchen How Will I feel after the treatment? o Soreness is normal, and the onset of soreness may not occur for a few hours. Typically this soreness does not last longer than two days.  o Bruising is uncommon, however; ice can be used to decrease any possible bruising.  o In rare cases feeling tired or nauseous after the treatment is normal. In addition, your symptoms may get worse before they get better, this period will typically not last longer than 24 hours.   . What Can I do After My Treatment? o Increase your hydration by drinking more water for the next 24 hours. o You may place ice or heat on the areas treated that have become sore, however, do not use heat on inflamed or bruised areas. Heat often brings more relief post needling. o You can continue your regular activities, but vigorous activity is not recommended initially after the treatment for 24 hours. o DN is best combined with other physical therapy such as strengthening, stretching, and other therapies.    Access Code: [email or web address] URL: [email or web address] Date:  09/15/2020 Prepared by: Anabel Halon  Exercises Tall Kneeling Anti-Rotation Press - 1 x daily - 7 x weekly - 2 sets - 15 reps Supine Hip Adduction Isometric with Ball - 1 x daily - 7 x weekly - 2 sets - 10 reps - 5s hold Supine Posterior Pelvic Tilt - 1 x daily - 7 x weekly - 2 sets - 10 reps - 3s hold Supine Double Knee to Chest - 1 x daily - 7 x weekly - 1 sets - 3 reps - 10s hold Supine Lower Trunk Rotation - 1 x daily - 7 x weekly - 1 sets - 3 reps - 10s hold

## 2020-09-24 ENCOUNTER — Ambulatory Visit: Payer: Medicaid Other | Admitting: Physical Therapy

## 2020-09-24 ENCOUNTER — Other Ambulatory Visit: Payer: Self-pay

## 2020-09-24 DIAGNOSIS — G8929 Other chronic pain: Secondary | ICD-10-CM

## 2020-09-24 DIAGNOSIS — M545 Low back pain, unspecified: Secondary | ICD-10-CM

## 2020-09-24 DIAGNOSIS — R252 Cramp and spasm: Secondary | ICD-10-CM

## 2020-09-24 NOTE — Therapy (Signed)
[facility] [address], [place], [place], [address] Phone: [phone]   Fax:  [phone]  Physical Therapy Treatment  Patient Details  Name: CHRISTAIN NIZNIK MRN: [id number] Date of Birth: Mar 18, 1986 Referring Provider (PT): Jacalyn Lefevre, MD   Encounter Date: 09/24/2020   PT End of Session - 09/24/20 1728    Visit Number 2    Date for PT Re-Evaluation 10/31/20    Authorization Type Medicaid    PT Start Time 9357    PT Stop Time 0177    PT Time Calculation (min) 39 min    Activity Tolerance Patient tolerated treatment well;No increased pain    Behavior During Therapy WFL for tasks assessed/performed           Past Medical History:  Diagnosis Date  . Calcium kidney stones 2007  . Hyperlipidemia   . Hypertension   . Olecranon fracture    left  . Shingles 2006    Past Surgical History:  Procedure Laterality Date  . FRACTURE SURGERY    . I & D EXTREMITY Left 09/13/2015   Procedure: IRRIGATION AND DEBRIDEMENT EXTREMITY;  Surgeon: Leandrew Koyanagi, MD;  Location: [place];  Service: Orthopedics;  Laterality: Left;  . ORIF ELBOW FRACTURE Left 10/30/2015   Procedure: OPEN REDUCTION INTERNAL FIXATION (ORIF) LEFT OLECRANON FRACTURE;  Surgeon: Leandrew Koyanagi, MD;  Location: [place];  Service: Orthopedics;  Laterality: Left;  . ORIF HUMERUS FRACTURE Left 09/13/2015   Procedure: OPEN REDUCTION INTERNAL FIXATION (ORIF) ELBOW/OLECRANON AND I AND D;  Surgeon: Leandrew Koyanagi, MD;  Location: [place];  Service: Orthopedics;  Laterality: Left;  . TONSILLECTOMY    . VASECTOMY      There were no vitals filed for this visit.   Subjective Assessment - 09/24/20 1616    Subjective Patient reports that symptoms are "about the same".    Limitations Sitting;Standing    How long can you sit comfortably? 5-10 minutes    How long can you stand comfortably? 20-30 minutes    Currently in Pain? Yes    Pain Score 4     Pain Location Back     Pain Orientation Lower;Right    Pain Descriptors / Indicators Aching;Sore    Pain Type Chronic pain    Pain Onset More than a month ago    Pain Frequency Constant                             [facility] Adult PT Treatment/Exercise - 09/24/20 0001      Lumbar Exercises: Stretches   Single Knee to Chest Stretch Right;2 reps;20 seconds    Piriformis Stretch Right;3 reps;20 seconds    Other Lumbar Stretch Exercise lumbar flexion at high table; 2 x 20s      Lumbar Exercises: Seated   Other Seated Lumbar Exercises ball roll into lumbar flexion with Lt lateral flexion and rotation; 5 x 10s      Lumbar Exercises: Supine   Ab Set 10 reps;5 seconds    Bridge Limitations articulated bridge; 2 x 10 repetitions; verbal cues for controlled descent      Modalities   Modalities Moist Heat      Moist Heat Therapy   Number Minutes Moist Heat 5 Minutes    Moist Heat Location Lumbar Spine      Manual Therapy   Manual Therapy Joint mobilization;Soft tissue mobilization    Joint Mobilization PAs Grade  I-II T11-L5    Soft tissue mobilization STM Rt lumbar paraspinals, erector spinae, QL            Trigger Point Dry Needling - 09/24/20 0001    Consent Given? Yes    Education Handout Provided Previously provided    Muscles Treated Back/Hip Lumbar multifidi;Thoracic multifidi    Lumbar multifidi Response Palpable increased muscle length    Thoracic multifidi response Palpable increased muscle length                  PT Short Term Goals - 09/24/20 1728      PT SHORT TERM GOAL #1   Title Patient will be independent with HEP for continued progression at home.    Time 3    Period Weeks    Status On-going    Target Date 10/06/20      PT SHORT TERM GOAL #2   Title Patient will report no more than 4/10 pain at end of day for two consecutive visits to indicate improved activity tolerance    Baseline 8/10    Time 3    Period Weeks    Status Partially Met   4/10 pain this  date   Target Date 10/06/20             PT Long Term Goals - 09/15/20 1613      PT LONG TERM GOAL #1   Title Patient will be independent with advanced HEP for long term management of symptoms post D/C.    Time 6    Period Weeks    Status New    Target Date 10/27/20      PT LONG TERM GOAL #2   Title Patient will improve FOTO score to 67 to indicate improved overall function.    Baseline 60    Time 6    Period Weeks    Status New    Target Date 10/27/20      PT LONG TERM GOAL #3   Title Patient will lift 50 lbs from floor level with good form x5 repetitions to more readily perform work duties.    Time 6    Status New    Target Date 10/27/20                 Plan - 09/24/20 1723    Clinical Impression Statement Patient reporting decreased pain at end of session. Reporting significant relief with introduction to supine piriformis stretch. Verbal cues for eccentric control when performing articulated bridge exercise. Educating patient on DN aftercare at end of session with patient verbalizing understanding. Would benefit from continued skilled intervention to address impairments for improved activity tolerance and to more readily complete work duties.    Examination-Activity Limitations Bend;Lift;Sit;Carry    Examination-Participation Restrictions Occupation;Community Activity    Rehab Potential Excellent    PT Frequency 1x / week    PT Duration 6 weeks    PT Treatment/Interventions ADLs/Self Care Home Management;Cryotherapy;Electrical Stimulation;Iontophoresis 4mg /ml Dexamethasone;Moist Heat;Traction;Functional mobility training;Therapeutic activities;Therapeutic exercise;Neuromuscular re-education;Patient/family education;Manual techniques;Dry needling;Taping;Spinal Manipulations;Joint Manipulations    PT Next Visit Plan continue DN as needed; progress core and glute strengthening adding functional component as appropriate    PT Home Exercise Plan Access Code: [email or web address]     Consulted and Agree with Plan of Care Patient           Patient will benefit from skilled therapeutic intervention in order to improve the following deficits and impairments:  Decreased activity tolerance,Decreased strength,Increased muscle spasms,Improper body mechanics,Postural  dysfunction,Pain  Visit Diagnosis: Chronic right-sided low back pain without sciatica  Cramp and spasm     Problem List Patient Active Problem List   Diagnosis Date Noted  . Hyperlipidemia 03/14/2020  . Controlled substance agreement signed 02/08/2019  . GAD (generalized anxiety disorder) 06/29/2018  . MDD (major depressive disorder), single episode, moderate ([place]) 06/29/2018  . Insomnia due to anxiety and fear 06/29/2018  . GERD (gastroesophageal reflux disease) 08/19/2016  . Allergic rhinitis 08/19/2016  . S/P ORIF (open reduction internal fixation) fracture 09/13/2015  . HTN (hypertension) 10/17/2012   Everardo All PT, DPT  09/24/20 5:33 PM    [facility] [address], [place], [place], [address] Phone: [phone]   Fax:  [phone]  Name: ZAC TORTI MRN: [id number] Date of Birth: 01-27-1986

## 2020-10-02 ENCOUNTER — Other Ambulatory Visit: Payer: Self-pay | Admitting: Family Medicine

## 2020-10-02 ENCOUNTER — Ambulatory Visit: Payer: Medicaid Other | Admitting: Physical Therapy

## 2020-10-02 DIAGNOSIS — F409 Phobic anxiety disorder, unspecified: Secondary | ICD-10-CM

## 2020-10-07 ENCOUNTER — Ambulatory Visit: Payer: Medicaid Other | Admitting: Physical Therapy

## 2020-10-07 ENCOUNTER — Other Ambulatory Visit: Payer: Self-pay

## 2020-10-07 DIAGNOSIS — M545 Low back pain, unspecified: Secondary | ICD-10-CM | POA: Diagnosis not present

## 2020-10-07 DIAGNOSIS — R252 Cramp and spasm: Secondary | ICD-10-CM

## 2020-10-07 DIAGNOSIS — G8929 Other chronic pain: Secondary | ICD-10-CM

## 2020-10-07 NOTE — Therapy (Signed)
[facility] [address] [place], [place], [address] Phone: [phone]   Fax:  [phone]  Physical Therapy Treatment  Patient Details  Name: Jacob Benson MRN: [id number] Date of Birth: 10-29-1985 Referring Provider (PT): Kasandra Knudsen, MD   Encounter Date: 10/07/2020   PT End of Session - 10/07/20 1714    Visit Number 3    Date for PT Re-Evaluation 10/31/20    Authorization Type Medicaid    PT Start Time 1414    PT Stop Time 1453    PT Time Calculation (min) 39 min    Activity Tolerance Patient tolerated treatment well;No increased pain    Behavior During Therapy WFL for tasks assessed/performed           Past Medical History:  Diagnosis Date  . Calcium kidney stones 2007  . Hyperlipidemia   . Hypertension   . Olecranon fracture    left  . Shingles 2006    Past Surgical History:  Procedure Laterality Date  . FRACTURE SURGERY    . I & D EXTREMITY Left 09/13/2015   Procedure: IRRIGATION AND DEBRIDEMENT EXTREMITY;  Surgeon: Tarry Kos, MD;  [facility];  Service: Orthopedics;  Laterality: Left;  . ORIF ELBOW FRACTURE Left 10/30/2015   Procedure: OPEN REDUCTION INTERNAL FIXATION (ORIF) LEFT OLECRANON FRACTURE;  Surgeon: Tarry Kos, MD;  [facility];  Service: Orthopedics;  Laterality: Left;  . ORIF HUMERUS FRACTURE Left 09/13/2015   Procedure: OPEN REDUCTION INTERNAL FIXATION (ORIF) ELBOW/OLECRANON AND I AND D;  Surgeon: Tarry Kos, MD;  [facility];  Service: Orthopedics;  Laterality: Left;  . TONSILLECTOMY    . VASECTOMY      There were no vitals filed for this visit.   Subjective Assessment - 10/07/20 1713    Subjective Patient reports approximately 4 days of pain relief following last session    Limitations Sitting;Standing    How long can you sit comfortably? 5-10 minutes    How long can you stand comfortably? 20-30 minutes    Currently in Pain? No/denies                              [facility] Adult PT Treatment/Exercise - 10/07/20 0001      Lumbar Exercises: Stretches   Single Knee to Chest Stretch Right;Left;2 reps;20 seconds    Double Knee to Chest Stretch 2 reps;20 seconds    Pelvic Tilt 10 reps    Pelvic Tilt Limitations verbal cues for decreased glute clinch and isolation of pelvic movement    Piriformis Stretch Right;Left;2 reps;20 seconds      Manual Therapy   Manual Therapy Joint mobilization;Soft tissue mobilization    Joint Mobilization PAs Grade 1-4 T11-L5    Soft tissue mobilization STM Rt lumbar paraspinals, erector spinae, QL            Trigger Point Dry Needling - 10/07/20 0001    Consent Given? Yes    Education Handout Provided Previously provided    Muscles Treated Back/Hip Erector spinae;Lumbar multifidi    Electrical Stimulation Performed with Dry Needling Yes    Erector spinae Response Palpable increased muscle length    Lumbar multifidi Response Palpable increased muscle length                  PT Short Term Goals - 10/07/20 1714      PT SHORT TERM GOAL #1  Title Patient will be independent with HEP for continued progression at home.    Time 3    Period Weeks    Status Achieved    Target Date 10/06/20      PT SHORT TERM GOAL #2   Title Patient will report no more than 4/10 pain at end of day for two consecutive visits to indicate improved activity tolerance    Baseline 8/10    Time 3    Period Weeks    Status Achieved   No pain reported this date   Target Date 10/06/20             PT Long Term Goals - 09/15/20 1613      PT LONG TERM GOAL #1   Title Patient will be independent with advanced HEP for long term management of symptoms post D/C.    Time 6    Period Weeks    Status New    Target Date 10/27/20      PT LONG TERM GOAL #2   Title Patient will improve FOTO score to 67 to indicate improved overall function.    Baseline 60    Time 6    Period Weeks    Status New     Target Date 10/27/20      PT LONG TERM GOAL #3   Title Patient will lift 50 lbs from floor level with good form x5 repetitions to more readily perform work duties.    Time 6    Status New    Target Date 10/27/20                 Plan - 10/07/20 1713    Clinical Impression Statement Therapist noting improved lumbar segmental mobility with PA mobilizations this date. Patient reports significantly decreased pain overall with work duties and ambulation. Verbal cues required when performing pelvic tilt exercise indicating continued proprioceptive impairments with regards to activation of deep lumbar stabilizers. Would benefit from continued skilled intervention for improved activity tolerance and to more readily perform work duties.    Examination-Activity Limitations Bend;Lift;Sit;Carry    Examination-Participation Restrictions Occupation;Community Activity    Rehab Potential Excellent    PT Frequency 1x / week    PT Duration 6 weeks    PT Treatment/Interventions ADLs/Self Care Home Management;Cryotherapy;Electrical Stimulation;Iontophoresis 4mg /ml Dexamethasone;Moist Heat;Traction;Functional mobility training;Therapeutic activities;Therapeutic exercise;Neuromuscular re-education;Patient/family education;Manual techniques;Dry needling;Taping;Spinal Manipulations;Joint Manipulations    PT Next Visit Plan DN as needed; add functional training to patient tolerance; core strengthening    PT Home Exercise Plan Access Code:           Patient will benefit from skilled therapeutic intervention in order to improve the following deficits and impairments:  Decreased activity tolerance,Decreased strength,Increased muscle spasms,Improper body mechanics,Postural dysfunction,Pain  Visit Diagnosis: Chronic right-sided low back pain without sciatica  Cramp and spasm     Problem List Patient Active Problem List   Diagnosis Date Noted  . Hyperlipidemia 03/14/2020  . Controlled  substance agreement signed 02/08/2019  . GAD (generalized anxiety disorder) 06/29/2018  . MDD (major depressive disorder), single episode, moderate ([facility]) 06/29/2018  . Insomnia due to anxiety and fear 06/29/2018  . GERD (gastroesophageal reflux disease) 08/19/2016  . Allergic rhinitis 08/19/2016  . S/P ORIF (open reduction internal fixation) fracture 09/13/2015  . HTN (hypertension) 10/17/2012    12/17/2012 PT, DPT  10/07/20 5:16 PM   [facility] [address] [place], [place], [place] Phone: [phone]  Fax:  [phone]  Name: DRAYSON DORKO MRN: [id number] Date of Birth: 12-23-1985

## 2020-10-14 ENCOUNTER — Ambulatory Visit: Payer: Medicaid Other | Admitting: Physical Therapy

## 2020-10-14 ENCOUNTER — Other Ambulatory Visit: Payer: Self-pay

## 2020-10-14 DIAGNOSIS — G8929 Other chronic pain: Secondary | ICD-10-CM

## 2020-10-14 DIAGNOSIS — M545 Low back pain, unspecified: Secondary | ICD-10-CM | POA: Diagnosis not present

## 2020-10-14 DIAGNOSIS — R252 Cramp and spasm: Secondary | ICD-10-CM

## 2020-10-14 NOTE — Therapy (Addendum)
[facility] [address], [place], [place], [address] Phone: [phone]   Fax:  [phone]  Physical Therapy Treatment  Patient Details  Name: Jacob Benson MRN: [id number] Date of Birth: 09-16-1985 Referring Provider (PT): Jacalyn Lefevre, MD   Encounter Date: 10/14/2020   PT End of Session - 10/14/20 1715    Visit Number 4    Date for PT Re-Evaluation 10/31/20    Authorization Type Medicaid    PT Start Time 0300    PT Stop Time 9233   Patient requesting to leave early due to another appointment   PT Time Calculation (min) 30 min    Activity Tolerance Patient tolerated treatment well;No increased pain    Behavior During Therapy WFL for tasks assessed/performed           Past Medical History:  Diagnosis Date  . Calcium kidney stones 2007  . Hyperlipidemia   . Hypertension   . Olecranon fracture    left  . Shingles 2006    Past Surgical History:  Procedure Laterality Date  . FRACTURE SURGERY    . I & D EXTREMITY Left 09/13/2015   Procedure: IRRIGATION AND DEBRIDEMENT EXTREMITY;  Surgeon: Leandrew Koyanagi, MD;  Location: [place];  Service: Orthopedics;  Laterality: Left;  . ORIF ELBOW FRACTURE Left 10/30/2015   Procedure: OPEN REDUCTION INTERNAL FIXATION (ORIF) LEFT OLECRANON FRACTURE;  Surgeon: Leandrew Koyanagi, MD;  Location: [facility];  Service: Orthopedics;  Laterality: Left;  . ORIF HUMERUS FRACTURE Left 09/13/2015   Procedure: OPEN REDUCTION INTERNAL FIXATION (ORIF) ELBOW/OLECRANON AND I AND D;  Surgeon: Leandrew Koyanagi, MD;  Location: [facility];  Service: Orthopedics;  Laterality: Left;  . TONSILLECTOMY    . VASECTOMY      There were no vitals filed for this visit.   Subjective Assessment - 10/14/20 1711    Subjective Patient reports soreness but continues to notice improvement.    Limitations Sitting;Standing    How long can you sit comfortably? 10-15 minutes    How long can you stand comfortably? 20-30 minutes     Currently in Pain? No/denies    Pain Onset More than a month ago                             [facility] Adult PT Treatment/Exercise - 10/14/20 0001      Lumbar Exercises: Stretches   Hip Flexor Stretch Right;Left;2 reps;20 seconds    Hip Flexor Stretch Limitations 1/2 kneeling    Piriformis Stretch Right;Left;2 reps;20 seconds    Other Lumbar Stretch Exercise childs pose; 3 x 10s hold      Lumbar Exercises: Supine   Bridge with [employer] 20 reps    Bridge with [employer] Limitations articulated bridge      Lumbar Exercises: Quadruped   [place] 10 reps      Manual Therapy   Soft tissue mobilization STM and skilled palpation of tissues before, during, and after dry needling            Trigger Point Dry Needling - 10/14/20 0001    Consent Given? Yes    Education Handout Provided Previously provided    Muscles Treated Back/Hip Erector spinae;Lumbar multifidi    Erector spinae Response Twitch response elicited    Lumbar multifidi Response Twitch response elicited                  PT  Short Term Goals - 10/14/20 1714      PT SHORT TERM GOAL #1   Title Patient will be independent with HEP for continued progression at home.    Time 3    Period Weeks    Status Achieved    Target Date 10/06/20      PT SHORT TERM GOAL #2   Title Patient will report no more than 4/10 pain at end of day for two consecutive visits to indicate improved activity tolerance    Baseline 8/10    Time 3    Period Weeks    Status Achieved    Target Date 10/06/20             PT Long Term Goals - 09/15/20 1613      PT LONG TERM GOAL #1   Title Patient will be independent with advanced HEP for long term management of symptoms post D/C.    Time 6    Period Weeks    Status New    Target Date 10/27/20      PT LONG TERM GOAL #2   Title Patient will improve FOTO score to 67 to indicate improved overall function.    Baseline 60    Time 6    Period Weeks     Status New    Target Date 10/27/20      PT LONG TERM GOAL #3   Title Patient will lift 50 lbs from floor level with good form x5 repetitions to more readily perform work duties.    Time 6    Status New    Target Date 10/27/20                 Plan - 10/14/20 1712    Clinical Impression Statement Therapist noting increased tenderness of lumbar multifidi at levels L3-5. Twitch response elicited with dry needling to these areas. Verbal and visual cues provided when performing 1/2 kneel hip flexor stretch. Would benefit from continued skilled intervention but could continue current progression at decreased frequency.    Examination-Activity Limitations Bend;Lift;Sit;Carry    Examination-Participation Restrictions Occupation;Community Activity    Rehab Potential Excellent    PT Frequency 1x / week    PT Duration 6 weeks    PT Treatment/Interventions ADLs/Self Care Home Management;Cryotherapy;Electrical Stimulation;Iontophoresis 88m/ml Dexamethasone;Moist Heat;Traction;Functional mobility training;Therapeutic activities;Therapeutic exercise;Neuromuscular re-education;Patient/family education;Manual techniques;Dry needling;Taping;Spinal Manipulations;Joint Manipulations    PT Next Visit Plan functional training to patient tolerance; core strengthening    PT Home Exercise Plan Access Code: [email or web address]   Consulted and Agree with Plan of Care Patient           Patient will benefit from skilled therapeutic intervention in order to improve the following deficits and impairments:  Decreased activity tolerance,Decreased strength,Increased muscle spasms,Improper body mechanics,Postural dysfunction,Pain  Visit Diagnosis: Cramp and spasm  Chronic right-sided low back pain without sciatica     Problem List Patient Active Problem List   Diagnosis Date Noted  . Hyperlipidemia 03/14/2020  . Controlled substance agreement signed 02/08/2019  . GAD (generalized anxiety disorder) 06/29/2018  .  MDD (major depressive disorder), [place] episode, moderate [place] 06/29/2018  . Insomnia due to anxiety and fear 06/29/2018  . GERD (gastroesophageal reflux disease) 08/19/2016  . Allergic rhinitis 08/19/2016  . S/P ORIF (open reduction internal fixation) fracture 09/13/2015  . HTN (hypertension) 10/17/2012   KEverardo AllPT, DPT  10/14/20 5:22 PM   [facility] [address] [place] [address]  Phone: [phone]   Fax:  [phone]  Name: Jacob Benson MRN: [id number] Date of Birth: 25-Dec-1985  PHYSICAL THERAPY DISCHARGE SUMMARY  Visits from Start of Care: 4  Current functional level related to goals / functional outcomes: See above   Remaining deficits: See above   Education / Equipment: See above  Plan: Patient agrees to discharge.  Patient goals were met. Patient is being discharged due to being pleased with the current functional level.  ?????        Everardo All PT, DPT  10/20/20 4:45 PM

## 2020-10-20 ENCOUNTER — Ambulatory Visit: Payer: Medicaid Other | Attending: Urology | Admitting: Physical Therapy

## 2020-10-20 ENCOUNTER — Telehealth: Payer: Self-pay | Admitting: Physical Therapy

## 2020-10-20 NOTE — Telephone Encounter (Signed)
Patient did not show for today's appointment. States that he forgot to call and is feeling much better. Would like to be discharged.

## 2020-10-28 ENCOUNTER — Encounter: Payer: Medicaid Other | Admitting: Physical Therapy

## 2020-11-19 ENCOUNTER — Other Ambulatory Visit: Payer: Self-pay | Admitting: Family Medicine

## 2020-11-19 DIAGNOSIS — F409 Phobic anxiety disorder, unspecified: Secondary | ICD-10-CM

## 2021-01-21 ENCOUNTER — Other Ambulatory Visit: Payer: Self-pay | Admitting: Family Medicine

## 2021-01-21 DIAGNOSIS — K219 Gastro-esophageal reflux disease without esophagitis: Secondary | ICD-10-CM

## 2021-02-15 ENCOUNTER — Other Ambulatory Visit: Payer: Self-pay | Admitting: Family Medicine

## 2021-02-15 DIAGNOSIS — K219 Gastro-esophageal reflux disease without esophagitis: Secondary | ICD-10-CM

## 2021-02-16 NOTE — Telephone Encounter (Signed)
[place]. NTBS 30 days given 01/21/21

## 2021-04-16 ENCOUNTER — Other Ambulatory Visit: Payer: Self-pay | Admitting: Family Medicine

## 2021-04-16 DIAGNOSIS — E785 Hyperlipidemia, unspecified: Secondary | ICD-10-CM

## 2021-05-29 ENCOUNTER — Telehealth: Payer: Self-pay | Admitting: Family Medicine

## 2021-05-29 NOTE — Telephone Encounter (Signed)
Yes that is fine to switch if he is ok with it

## 2021-07-10 ENCOUNTER — Telehealth: Payer: No Typology Code available for payment source | Admitting: Family Medicine

## 2022-04-03 ENCOUNTER — Telehealth: Payer: Self-pay | Admitting: Nurse Practitioner

## 2022-04-03 DIAGNOSIS — J029 Acute pharyngitis, unspecified: Secondary | ICD-10-CM

## 2022-04-03 MED ORDER — AMOXICILLIN 500 MG PO CAPS: 500.0000 mg | ORAL_CAPSULE | Freq: Two times a day (BID) | ORAL | 0 refills | Status: AC

## 2022-04-03 NOTE — Progress Notes (Signed)

## 2022-12-30 ENCOUNTER — Ambulatory Visit: Payer: Managed Care, Other (non HMO) | Admitting: Nurse Practitioner

## 2022-12-30 ENCOUNTER — Encounter: Payer: Self-pay | Admitting: Nurse Practitioner

## 2022-12-30 VITALS — BP 120/79 | HR 71 | Temp 98.1°F | Resp 20 | Ht 70.0 in | Wt 179.0 lb

## 2022-12-30 DIAGNOSIS — K1120 Sialoadenitis, unspecified: Secondary | ICD-10-CM | POA: Diagnosis not present

## 2022-12-30 MED ORDER — CLINDAMYCIN HCL 300 MG PO CAPS: 300.0000 mg | ORAL_CAPSULE | Freq: Four times a day (QID) | ORAL | 0 refills | Status: DC

## 2022-12-30 NOTE — Patient Instructions (Signed)
Salivary Gland Infection  A salivary gland infection is an infection in one or more of the glands that make saliva. There are six major salivary glands. Each gland has a tube (duct) that carries saliva into the mouth. Saliva keeps the mouth moist and breaks down the food that a person eats. It also helps prevent tooth decay. You have two salivary glands just in front of your ears (parotid). The ducts for these glands open up inside your cheeks, near your back teeth. You also have two glands under your tongue (sublingual) and two glands under your jaw (submandibular). The ducts for these glands open under your tongue. Any salivary gland can get infected. Most infections occur in the parotid glands or submandibular glands. What are the causes? This condition may be caused by bacteria or viruses. The bacteria that cause salivary gland infections are usually the same bacteria that normally live in your mouth. A stone can form in a salivary gland and block the flow of saliva. Bacteria may then start to grow behind the blockage and cause infection. Bacterial infections usually cause pain and swelling on one side of the face. Submandibular gland swelling occurs under the jaw. Parotid swelling occurs in front of the ear. Bacterial infections are more common in adults. The most common cause of viral salivary gland infections is the mumps virus. However, vaccination has made mumps rare. This infection causes swelling in both parotid glands. Viral infections are more common in children. What increases the risk? You are more likely to develop a bacterial infection if: You do not take good care of your mouth and teeth (have poor oral hygiene). You smoke. You do not drink enough water. You have a disease that causes dry mouth and dry eyes (Mikulicz syndrome or Sjgren syndrome). A viral infection is more likely to occur in children who do not get the measles, mumps, and rubella (MMR) vaccine. What are the  signs or symptoms? The main sign of a salivary gland infection is sialadenitis, which is swelling in a salivary gland. You may have swelling in front of your ear, under your jaw, or under your tongue. Swelling may get worse when you eat and decrease after you eat. Other signs and symptoms include: Pain. Tenderness. Redness. Dry mouth. Bad taste in your mouth. Trouble chewing and swallowing. Fever. How is this diagnosed? This condition may be diagnosed based on: Your signs and symptoms. A physical exam. During the exam, your health care provider will look and feel inside your mouth to see whether a stone is blocking a salivary gland duct. Tests, such as: An X-ray to check for a stone. An ultrasound, CT scan, or MRI to look for an infected gland (abscess) and to rule out other causes of swelling. A culture and sensitivity test. In this test, a sample of pus is taken from the salivary gland by using a swab or a needle (aspiration). The sample is tested in a lab to determine the type of bacteria involved and which antibiotic medicines will work against it. You may need to see an ear, nose, and throat specialist (ENT or otolaryngologist) for diagnosis and treatment. How is this treated? Viral salivary gland infections usually clear up without treatment. Bacterial infections are usually treated with antibiotic medicine. Severe infections that cause trouble swallowing may be treated with an IV antibiotic in the hospital. Other treatments may include: Probing and widening the salivary duct to let a stone pass. In some cases, a thin, flexible scope (endoscope) may be   put into the duct to find a stone and remove it. Breaking up a stone using sound waves. Draining an abscess with a needle. Surgery to: Remove a stone. Drain pus from an abscess. Remove a gland that has a bad or long-term infection. Follow these instructions at home: Medicines Take over-the-counter and prescription medicines only as  told by your health care provider. If you were prescribed an antibiotic medicine, take it as told by your health care provider. Do not stop taking the antibiotic even if you start to feel better. To relieve discomfort Follow these instructions every few hours: Suck on a lemon candy or a vitamin C lozenge to prompt the flow of saliva. Put a warm, wet cloth (warmcompress) over the gland. Gently massage the gland. Gargle with a mixture of salt and water 3-4 times a day or as needed. To make salt water, completely dissolve -1 tsp (3-6 g) of salt in 1 cup (237 mL) of warm water. General instructions  Practice good oral hygiene by brushing and flossing your teeth after meals and before you go to bed. Drink enough fluid to keep your urine pale yellow. Do not use any products that contain nicotine or tobacco. These products include cigarettes, chewing tobacco, and vaping devices, such as e-cigarettes. If you need help quitting, ask your health care provider. Keep all follow-up visits. This is important. Contact a health care provider if: You have pain and swelling in your face, jaw, or mouth after eating. You keep having swelling in any of these places: In front of your ear. Under your jaw. Inside your mouth. Get help right away if: You have pain and swelling in your face, jaw, or mouth that suddenly gets worse. Your pain and swelling make it hard to swallow, talk, or breathe. These symptoms may be an emergency. Get help right away. Call 911. Do not wait to see if the symptoms will go away. Do not drive yourself to the hospital. Summary A salivary gland infection is an infection in one or more of the glands that make saliva. Any salivary gland can get infected. This condition may be caused by bacteria or viruses. Salivary gland infections caused by a virus usually clear up without treatment. Bacterial infections are usually treated with antibiotic medicine. This information is not intended to  replace advice given to you by your health care provider. Make sure you discuss any questions you have with your health care provider. Document Revised: 04/29/2021 Document Reviewed: 04/29/2021 Elsevier Patient Education  2024 Elsevier Inc.  

## 2022-12-30 NOTE — Progress Notes (Signed)
Subjective:    Patient ID: Jacob Benson, male    DOB: December 01, 1985, 37 y.o.   MRN: [id number]   Chief Complaint: mouth sore HPI  Has a swollen gland left side of neck and has swelling under tongue on left side. Says he has white stuff coming out of left salivary gland. He dips several times.  Patient Active Problem List   Diagnosis Date Noted   Hyperlipidemia 03/14/2020   Controlled substance agreement signed 02/08/2019   GAD (generalized anxiety disorder) 06/29/2018   MDD (major depressive disorder), single episode, moderate (HCC) 06/29/2018   Insomnia due to anxiety and fear 06/29/2018   GERD (gastroesophageal reflux disease) 08/19/2016   Allergic rhinitis 08/19/2016   S/P ORIF (open reduction internal fixation) fracture 09/13/2015   HTN (hypertension) 10/17/2012       Review of Systems  Constitutional:  Negative for diaphoresis.  Eyes:  Negative for pain.  Respiratory:  Negative for shortness of breath.   Cardiovascular:  Negative for chest pain, palpitations and leg swelling.  Gastrointestinal:  Negative for abdominal pain.  Endocrine: Negative for polydipsia.  Skin:  Negative for rash.  Neurological:  Negative for dizziness, weakness and headaches.  Hematological:  Does not bruise/bleed easily.  All other systems reviewed and are negative.      Objective:   Physical Exam Constitutional:      Appearance: Normal appearance.  HENT:     Mouth/Throat:     Mouth: Mucous membranes are moist.     Comments: Left salivary gland under tongue edematous and tender Cardiovascular:     Rate and Rhythm: Normal rate and regular rhythm.     Heart sounds: Normal heart sounds.  Pulmonary:     Effort: Pulmonary effort is normal.     Breath sounds: Normal breath sounds.  Lymphadenopathy:     Cervical: Cervical adenopathy (left anterior cervical lymphadenopathy) present.  Skin:    General: Skin is warm.  Neurological:     General: No focal deficit present.     Mental  Status: He is alert and oriented to person, place, and time.  Psychiatric:        Mood and Affect: Mood normal.        Behavior: Behavior normal.    BP 120/79   Pulse 71   Temp 98.1 F (36.7 C) (Temporal)   Resp 20   Ht 5\' 10"  (1.778 m)   Wt 179 lb (81.2 kg)   SpO2 97%   BMI 25.68 kg/m         Assessment & Plan:   Jacob Benson in today with chief complaint of No chief complaint on file.   1. Salivary gland infection STOP DIPPING Eat sour candy to flush  out salivary gland If not improving by Monday let me know and will do ENT referral  Meds ordered this encounter  Medications   clindamycin (CLEOCIN) 300 MG capsule    Sig: Take 1 capsule (300 mg total) by mouth 4 (four) times daily.    Dispense:  40 capsule    Refill:  0    Order Specific Question:   Supervising Provider    Answer:   Arville Care A [[id number]]       The above assessment and management plan was discussed with the patient. The patient verbalized understanding of and has agreed to the management plan. Patient is aware to call the clinic if symptoms persist or worsen. Patient is aware when to return to the clinic for a  follow-up visit. Patient educated on when it is appropriate to go to the emergency department.   Mary-Margaret Daphine Deutscher, FNP

## 2023-03-11 ENCOUNTER — Encounter: Payer: Self-pay | Admitting: Family Medicine

## 2023-03-11 ENCOUNTER — Ambulatory Visit (INDEPENDENT_AMBULATORY_CARE_PROVIDER_SITE_OTHER): Payer: Managed Care, Other (non HMO) | Admitting: Family Medicine

## 2023-03-11 ENCOUNTER — Telehealth: Payer: Self-pay | Admitting: Family Medicine

## 2023-03-11 VITALS — BP 134/81 | HR 67 | Temp 97.4°F | Ht 70.0 in | Wt 175.8 lb

## 2023-03-11 DIAGNOSIS — B084 Enteroviral vesicular stomatitis with exanthem: Secondary | ICD-10-CM | POA: Diagnosis not present

## 2023-03-11 DIAGNOSIS — J029 Acute pharyngitis, unspecified: Secondary | ICD-10-CM

## 2023-03-11 DIAGNOSIS — H66001 Acute suppurative otitis media without spontaneous rupture of ear drum, right ear: Secondary | ICD-10-CM

## 2023-03-11 LAB — RAPID STREP SCREEN (MED CTR MEBANE ONLY): Strep Gp A Ag, IA W/Reflex: NEGATIVE

## 2023-03-11 LAB — CULTURE, GROUP A STREP

## 2023-03-11 MED ORDER — LIDOCAINE VISCOUS HCL 2 % MT SOLN: 15.0000 mL | Freq: Four times a day (QID) | OROMUCOSAL | 0 refills | Status: DC | PRN

## 2023-03-11 MED ORDER — AMOXICILLIN 875 MG PO TABS: 875.0000 mg | ORAL_TABLET | Freq: Two times a day (BID) | ORAL | 0 refills | Status: AC

## 2023-03-11 NOTE — Telephone Encounter (Signed)
Did he not know who he wanted to switch to? Will have to get approval from the other provider as well.

## 2023-03-11 NOTE — Telephone Encounter (Signed)
Patient would like to switch from [facility]

## 2023-03-11 NOTE — Progress Notes (Signed)
Subjective:  Patient ID: Jacob Benson, male    DOB: January 22, 1986, 37 y.o.   MRN: [id number]  Patient Care Team: [facility], Elige Radon, MD as PCP - General (Family Medicine) Zethan Alfieri, Doralee Albino, FNP as Nurse Practitioner (Family Medicine)   Chief Complaint:  Sore Throat (X 6 days - with white patches ) and Ear Pain (Right x 6 days )   HPI: Jacob Benson is a 37 y.o. male presenting on 03/11/2023 for Sore Throat (X 6 days - with white patches ) and Ear Pain (Right x 6 days )   Discussed the use of AI scribe software for clinical note transcription with the patient, who gave verbal consent to proceed.  History of Present Illness   The patient presented with a chief complaint of a sore throat and ear pain, which had been ongoing for approximately six days. He reported a low-grade fever and the presence of spots on the back of his throat. The patient had been self-medicating with over-the-counter day and night cold medicine. He noted that his symptoms were similar to a viral infection that his children had experienced two weeks prior.  The patient reported that his right ear had been hurting for about four days. He also noted a decrease in his food and fluid intake due to the throat discomfort. Despite his symptoms, the patient had continued to work until the day before the consultation, when he felt he could no longer manage his symptoms. The patient had not been on antibiotics for a long time and had not been sick for approximately a year.          Relevant past medical, surgical, family, and social history reviewed and updated as indicated.  Allergies and medications reviewed and updated. Data reviewed: Chart in Epic.   Past Medical History:  Diagnosis Date   Calcium kidney stones 2007   Hyperlipidemia    Hypertension    Olecranon fracture    left   Shingles 2006    Past Surgical History:  Procedure Laterality Date   FRACTURE SURGERY     I & D EXTREMITY Left 09/13/2015    Procedure: IRRIGATION AND DEBRIDEMENT EXTREMITY;  Surgeon: Tarry Kos, MD;  Location: [facility] OR;  Service: Orthopedics;  Laterality: Left;   ORIF ELBOW FRACTURE Left 10/30/2015   Procedure: OPEN REDUCTION INTERNAL FIXATION (ORIF) LEFT OLECRANON FRACTURE;  Surgeon: Tarry Kos, MD;  Location: [facility] OR;  Service: Orthopedics;  Laterality: Left;   ORIF HUMERUS FRACTURE Left 09/13/2015   Procedure: OPEN REDUCTION INTERNAL FIXATION (ORIF) ELBOW/OLECRANON AND I AND D;  Surgeon: Tarry Kos, MD;  Location: [facility] OR;  Service: Orthopedics;  Laterality: Left;   TONSILLECTOMY     VASECTOMY      Social History   Socioeconomic History   Marital status: Married    Spouse name: Chanda   Number of children: 2   Years of education: Not on file   Highest education level: Not on file  Occupational History   Not on file  Tobacco Use   Smoking status: Former    Current packs/day: 0.00    Types: Cigarettes    Quit date: 05/23/2013    Years since quitting: 9.8   Smokeless tobacco: Former    Types: Snuff  Substance and Sexual Activity   Alcohol use: No    Alcohol/week: 0.0 standard drinks of alcohol   Drug use: No   Sexual activity: Yes    Birth control/protection: Surgical  Other  Topics Concern   Not on file  Social History Narrative   Not on file   Social Determinants of Health   Financial Resource Strain: Not on file  Food Insecurity: Not on file  Transportation Needs: Not on file  Physical Activity: Unknown (03/11/2023)   Exercise Vital Sign    Days of Exercise per Week: Patient declined    Minutes of Exercise per Session: Not on file  Stress: Not on file  Social Connections: Unknown (01/29/2022)   Received from [facility], [facility]   Social Network    Social Network: Not on file  Intimate Partner Violence: Unknown (01/29/2022)   Received from [facility], [facility]   HITS    Physically Hurt: Not on file    Insult or Talk Down To: Not on file    Threaten Physical Harm: Not on  file    Scream or Curse: Not on file    Outpatient Encounter Medications as of 03/11/2023  Medication Sig   albuterol (VENTOLIN HFA) 108 (90 Base) MCG/ACT inhaler Inhale 2 puffs into the lungs every 6 (six) hours as needed.   amoxicillin (AMOXIL) 875 MG tablet Take 1 tablet (875 mg total) by mouth 2 (two) times daily for 10 days.   esomeprazole (NEXIUM) 40 MG capsule Take 1 capsule (40 mg total) by mouth daily. (NEEDS TO BE SEEN BEFORE NEXT REFILL)   hydrochlorothiazide (MICROZIDE) 12.5 MG capsule Take 1 capsule (12.5 mg total) by mouth daily.   lidocaine (XYLOCAINE) 2 % solution Use as directed 15 mLs in the mouth or throat every 6 (six) hours as needed for mouth pain (throat pain).   montelukast (SINGULAIR) 10 MG tablet Take 1 tablet (10 mg total) by mouth at bedtime.   zolpidem (AMBIEN CR) 12.5 MG CR tablet Take 1 tablet (12.5 mg total) by mouth at bedtime as needed for sleep.   amLODipine (NORVASC) 5 MG tablet TAKE 1 TABLET BY MOUTH DAILY   QUEtiapine (SEROQUEL) 50 MG tablet Take 1 tablet (50 mg total) by mouth at bedtime.   [DISCONTINUED] clindamycin (CLEOCIN) 300 MG capsule Take 1 capsule (300 mg total) by mouth 4 (four) times daily.   No facility-administered encounter medications on file as of 03/11/2023.    Allergies  Allergen Reactions   Hydrocodone Other (See Comments)   Guaifenesin & Derivatives Palpitations   Oxycodone Nausea And Vomiting    Pertinent ROS per HPI, otherwise unremarkable      Objective:  BP 134/81   Pulse 67   Temp (!) 97.4 F (36.3 C) (Temporal)   Ht 5\' 10"  (1.778 m)   Wt 175 lb 12.8 oz (79.7 kg)   SpO2 98%   BMI 25.22 kg/m    Wt Readings from Last 3 Encounters:  03/11/23 175 lb 12.8 oz (79.7 kg)  12/30/22 179 lb (81.2 kg)  03/14/20 189 lb (85.7 kg)    Physical Exam Vitals and nursing note reviewed.  Constitutional:      General: He is not in acute distress.    Appearance: Normal appearance. He is well-developed and normal weight. He  is not ill-appearing, toxic-appearing or diaphoretic.  HENT:     Head: Normocephalic and atraumatic.     Right Ear: Tympanic membrane is erythematous.     Left Ear: Tympanic membrane and ear canal normal.     Nose: No congestion or rhinorrhea.     Mouth/Throat:     Mouth: Mucous membranes are moist. Oral lesions (blisters to posterior oropharynx) present.  Pharynx: Posterior oropharyngeal erythema present.  Eyes:     Conjunctiva/sclera: Conjunctivae normal.     Pupils: Pupils are equal, round, and reactive to light.  Cardiovascular:     Rate and Rhythm: Normal rate and regular rhythm.     Heart sounds: Normal heart sounds.  Pulmonary:     Effort: Pulmonary effort is normal.     Breath sounds: Normal breath sounds.  Musculoskeletal:     Cervical back: Neck supple.  Lymphadenopathy:     Cervical: Cervical adenopathy present.  Skin:    General: Skin is warm and dry.     Capillary Refill: Capillary refill takes less than 2 seconds.  Neurological:     General: No focal deficit present.     Mental Status: He is alert and oriented to person, place, and time.  Psychiatric:        Mood and Affect: Mood normal.        Behavior: Behavior normal.        Thought Content: Thought content normal.        Judgment: Judgment normal.    Physical Exam   HEENT: Throat blisters. Right ear redness.        Results for orders placed or performed in visit on 05/27/20  Veritor Flu A/B Waived  Result Value Ref Range   Influenza A Negative Negative   Influenza B Negative Negative       Pertinent labs & imaging results that were available during my care of the patient were reviewed by me and considered in my medical decision making.  Assessment & Plan:  Nechemia was seen today for sore throat and ear pain.  Diagnoses and all orders for this visit:  Sore throat -     Culture, Group A Strep -     Rapid Strep Screen ([facility] ONLY) -     lidocaine (XYLOCAINE) 2 % solution; Use as  directed 15 mLs in the mouth or throat every 6 (six) hours as needed for mouth pain (throat pain).  Non-recurrent acute suppurative otitis media of right ear without spontaneous rupture of tympanic membrane -     amoxicillin (AMOXIL) 875 MG tablet; Take 1 tablet (875 mg total) by mouth 2 (two) times daily for 10 days.  Hand, foot and mouth disease -     lidocaine (XYLOCAINE) 2 % solution; Use as directed 15 mLs in the mouth or throat every 6 (six) hours as needed for mouth pain (throat pain).     Assessment and Plan    Pharyngitis Likely viral, possibly hand, foot, mouth disease given the presence of blisters in the throat. Strep test negative. -Advise symptomatic management.  Otitis Media Right ear infection noted on examination, with patient reporting ear pain for approximately four days. -Prescribe Amoxicillin 500mg  twice daily for 10 days.  Pain Management Patient reports throat pain and difficulty eating and drinking. -Prescribe viscous Lidocaine for pain relief, to be used up to four times a day. Instruct patient to wait 20-30 minutes after use before eating.        Continue all other maintenance medications.  Follow up plan: Return if symptoms worsen or fail to improve.   Continue healthy lifestyle choices, including diet (rich in fruits, vegetables, and lean proteins, and low in salt and simple carbohydrates) and exercise (at least 30 minutes of moderate physical activity daily).   The above assessment and management plan was discussed with the patient. The patient verbalized understanding of and has agreed to  the management plan. Patient is aware to call the clinic if they develop any new symptoms or if symptoms persist or worsen. Patient is aware when to return to the clinic for a follow-up visit. Patient educated on when it is appropriate to go to the emergency department.   Kari Baars, FNP-C [facility] [phone]

## 2023-03-14 NOTE — Telephone Encounter (Signed)
I have seen him in the past, so this is ok.

## 2023-03-14 NOTE — Telephone Encounter (Signed)
I guess that is fine, realistically he has stopped coming to see me for a regular appointment in almost 3 years so I guess that is fine if he wants to change if she is okay with it

## 2023-03-14 NOTE — Telephone Encounter (Signed)
Switch to [employer]

## 2023-03-15 LAB — CULTURE, GROUP A STREP: Strep A Culture: NEGATIVE

## 2023-03-15 NOTE — Telephone Encounter (Signed)
Please call patient and make aware that [facility] will accept him as her pt. If an appt is needed please schedule. Thank you.

## 2023-03-21 ENCOUNTER — Telehealth: Payer: Self-pay | Admitting: Family Medicine

## 2023-03-21 ENCOUNTER — Other Ambulatory Visit: Payer: Self-pay | Admitting: Family Medicine

## 2023-03-21 DIAGNOSIS — H66001 Acute suppurative otitis media without spontaneous rupture of ear drum, right ear: Secondary | ICD-10-CM

## 2023-03-21 MED ORDER — AMOXICILLIN-POT CLAVULANATE 875-125 MG PO TABS: 1.0000 | ORAL_TABLET | Freq: Two times a day (BID) | ORAL | 0 refills | Status: DC

## 2023-03-21 NOTE — Telephone Encounter (Signed)
  Incoming Patient Call  03/21/2023  What symptoms do you have? Ears still hurting and infected and nasal and chest congestion and ST. Finished ABX and needs something else called in.  How long have you been sick? Three weeks  Have you been seen for this problem? YES with [facility] on 03-11-23.  If your provider decides to give you a prescription, which pharmacy would you like for it to be sent to? [facility].  Patient informed that this information will be sent to the clinical staff for review and that they should receive a follow up call.

## 2023-03-21 NOTE — Telephone Encounter (Signed)
Wants to ask [facility] suggestion. Will wait until she is in the office tomorrow. Informed that he may need an appt since he is not any better.

## 2023-03-21 NOTE — Telephone Encounter (Signed)
Will send in augmentin for broader coverage.

## 2023-06-29 ENCOUNTER — Ambulatory Visit: Payer: Managed Care, Other (non HMO) | Admitting: Family Medicine

## 2023-06-30 ENCOUNTER — Ambulatory Visit (INDEPENDENT_AMBULATORY_CARE_PROVIDER_SITE_OTHER): Payer: Managed Care, Other (non HMO) | Admitting: Family Medicine

## 2023-06-30 ENCOUNTER — Encounter: Payer: Self-pay | Admitting: Family Medicine

## 2023-06-30 VITALS — BP 132/78 | HR 73 | Temp 98.1°F | Ht 70.0 in | Wt 177.2 lb

## 2023-06-30 DIAGNOSIS — J101 Influenza due to other identified influenza virus with other respiratory manifestations: Secondary | ICD-10-CM | POA: Diagnosis not present

## 2023-06-30 DIAGNOSIS — I10 Essential (primary) hypertension: Secondary | ICD-10-CM | POA: Diagnosis not present

## 2023-06-30 DIAGNOSIS — R051 Acute cough: Secondary | ICD-10-CM

## 2023-06-30 DIAGNOSIS — K219 Gastro-esophageal reflux disease without esophagitis: Secondary | ICD-10-CM

## 2023-06-30 LAB — VERITOR FLU A/B WAIVED
Influenza A: POSITIVE — AB
Influenza B: NEGATIVE

## 2023-06-30 MED ORDER — AMLODIPINE BESYLATE 5 MG PO TABS: ORAL_TABLET | Freq: Every day | ORAL | 3 refills | Status: DC

## 2023-06-30 MED ORDER — ESOMEPRAZOLE MAGNESIUM 40 MG PO CPDR: 40.0000 mg | DELAYED_RELEASE_CAPSULE | Freq: Every day | ORAL | 0 refills | Status: DC

## 2023-06-30 MED ORDER — PROMETHAZINE-DM 6.25-15 MG/5ML PO SYRP: 5.0000 mL | ORAL_SOLUTION | Freq: Four times a day (QID) | ORAL | 0 refills | Status: DC | PRN

## 2023-06-30 MED ORDER — HYDROCHLOROTHIAZIDE 12.5 MG PO CAPS: 12.5000 mg | ORAL_CAPSULE | Freq: Every day | ORAL | 3 refills | Status: DC

## 2023-06-30 NOTE — Progress Notes (Signed)
Subjective:  Patient ID: Jacob Benson, male    DOB: 1985/08/19, 38 y.o.   MRN: [id number]  Patient Care Team: Sonny Masters, FNP as PCP - General (Family Medicine) Jillann Charette, Doralee Albino, FNP as Nurse Practitioner (Family Medicine)   Chief Complaint:  Nasal Congestion, Sore Throat, Generalized Body Aches, and Cough (X 3 days )   HPI: Jacob Benson is a 38 y.o. male presenting on 06/30/2023 for Nasal Congestion, Sore Throat, Generalized Body Aches, and Cough (X 3 days )   Discussed the use of AI scribe software for clinical note transcription with the patient, who gave verbal consent to proceed.  History of Present Illness   Jacob Benson is a 38 year old male who presents with congestion, coughing, body aches, and fatigue.  He has been experiencing congestion, coughing, body aches, and fatigue. Symptoms began with a cough on Monday night, followed by a general feeling of being unwell on Tuesday, and worsened by Wednesday. He had a single episode of fever with a recorded temperature of 101.70F, occurring at night. Ibuprofen has been used regularly to manage body aches.  A household member was sick on Saturday with similar symptoms but improved by Monday. He has not experienced any other significant symptoms and denies any serious redness or other complications.  He is seeking prescription refills for his blood pressure medications, hydrochlorothiazide and amlodipine, due to an upcoming job change that will result in a loss of insurance for six months. He last saw his previous doctor approximately six months ago, during which blood work was conducted.          Relevant past medical, surgical, family, and social history reviewed and updated as indicated.  Allergies and medications reviewed and updated. Data reviewed: Chart in Epic.   Past Medical History:  Diagnosis Date   Calcium kidney stones 2007   Hyperlipidemia    Hypertension    Olecranon fracture    left   Shingles  2006    Past Surgical History:  Procedure Laterality Date   FRACTURE SURGERY     I & D EXTREMITY Left 09/13/2015   Procedure: IRRIGATION AND DEBRIDEMENT EXTREMITY;  Surgeon: Tarry Kos, MD;  Location: [facility] OR;  Service: Orthopedics;  Laterality: Left;   ORIF ELBOW FRACTURE Left 10/30/2015   Procedure: OPEN REDUCTION INTERNAL FIXATION (ORIF) LEFT OLECRANON FRACTURE;  Surgeon: Tarry Kos, MD;  Location: [facility] OR;  Service: Orthopedics;  Laterality: Left;   ORIF HUMERUS FRACTURE Left 09/13/2015   Procedure: OPEN REDUCTION INTERNAL FIXATION (ORIF) ELBOW/OLECRANON AND I AND D;  Surgeon: Tarry Kos, MD;  Location: [facility] OR;  Service: Orthopedics;  Laterality: Left;   TONSILLECTOMY     VASECTOMY      Social History   Socioeconomic History   Marital status: Married    Spouse name: Doyne Keel   Number of children: 2   Years of education: Not on file   Highest education level: 12th grade  Occupational History   Not on file  Tobacco Use   Smoking status: Former    Current packs/day: 0.00    Types: Cigarettes    Quit date: 05/23/2013    Years since quitting: 10.1   Smokeless tobacco: Former    Types: Snuff  Substance and Sexual Activity   Alcohol use: No    Alcohol/week: 0.0 standard drinks of alcohol   Drug use: No   Sexual activity: Yes    Birth control/protection: Surgical  Other Topics Concern  Not on file  Social History Narrative   Not on file   Social Drivers of Health   Financial Resource Strain: Not on file  Food Insecurity: Not on file  Transportation Needs: Not on file  Physical Activity: Unknown (03/11/2023)   Exercise Vital Sign    Days of Exercise per Week: Patient declined    Minutes of Exercise per Session: Not on file  Stress: Not on file  Social Connections: Unknown (01/29/2022)   Received from [facility]   Social Network    Social Network: Not on file  Intimate Partner Violence: Unknown (01/29/2022)   Received from [facility]    HITS    Physically Hurt: Not on file    Insult or Talk Down To: Not on file    Threaten Physical Harm: Not on file    Scream or Curse: Not on file    Outpatient Encounter Medications as of 06/30/2023  Medication Sig   albuterol (VENTOLIN HFA) 108 (90 Base) MCG/ACT inhaler Inhale 2 puffs into the lungs every 6 (six) hours as needed.   LORazepam (ATIVAN) 1 MG tablet Take 1 mg by mouth every 8 (eight) hours.   montelukast (SINGULAIR) 10 MG tablet Take 1 tablet (10 mg total) by mouth at bedtime.   promethazine-dextromethorphan (PROMETHAZINE-DM) 6.25-15 MG/5ML syrup Take 5 mLs by mouth 4 (four) times daily as needed for cough.   zolpidem (AMBIEN CR) 12.5 MG CR tablet Take 1 tablet (12.5 mg total) by mouth at bedtime as needed for sleep.   [DISCONTINUED] esomeprazole (NEXIUM) 40 MG capsule Take 1 capsule (40 mg total) by mouth daily. (NEEDS TO BE SEEN BEFORE NEXT REFILL)   [DISCONTINUED] hydrochlorothiazide (MICROZIDE) 12.5 MG capsule Take 1 capsule (12.5 mg total) by mouth daily.   amLODipine (NORVASC) 5 MG tablet TAKE 1 TABLET BY MOUTH DAILY   esomeprazole (NEXIUM) 40 MG capsule Take 1 capsule (40 mg total) by mouth daily. (NEEDS TO BE SEEN BEFORE NEXT REFILL)   hydrochlorothiazide (MICROZIDE) 12.5 MG capsule Take 1 capsule (12.5 mg total) by mouth daily.   QUEtiapine (SEROQUEL) 50 MG tablet Take 1 tablet (50 mg total) by mouth at bedtime.   [DISCONTINUED] amLODipine (NORVASC) 5 MG tablet TAKE 1 TABLET BY MOUTH DAILY   [DISCONTINUED] amoxicillin-clavulanate (AUGMENTIN) 875-125 MG tablet Take 1 tablet by mouth 2 (two) times daily.   [DISCONTINUED] esomeprazole (NEXIUM) 40 MG capsule Take 1 capsule (40 mg total) by mouth daily. (NEEDS TO BE SEEN BEFORE NEXT REFILL)   [DISCONTINUED] lidocaine (XYLOCAINE) 2 % solution Use as directed 15 mLs in the mouth or throat every 6 (six) hours as needed for mouth pain (throat pain).   No facility-administered encounter medications on file as of 06/30/2023.     Allergies  Allergen Reactions   Hydrocodone Other (See Comments)   Guaifenesin & Derivatives Palpitations   Oxycodone Nausea And Vomiting    Pertinent ROS per HPI, otherwise unremarkable      Objective:  BP 132/78   Pulse 73   Temp 98.1 F (36.7 C)   Ht 5\' 10"  (1.778 m)   Wt 177 lb 3.2 oz (80.4 kg)   SpO2 95%   BMI 25.43 kg/m    Wt Readings from Last 3 Encounters:  06/30/23 177 lb 3.2 oz (80.4 kg)  03/11/23 175 lb 12.8 oz (79.7 kg)  12/30/22 179 lb (81.2 kg)    Physical Exam Vitals and nursing note reviewed.  Constitutional:      General: He is  not in acute distress.    Appearance: Normal appearance. He is well-developed, well-groomed and normal weight. He is not ill-appearing, toxic-appearing or diaphoretic.  HENT:     Head: Normocephalic and atraumatic.     Right Ear: A middle ear effusion is present. Tympanic membrane is not erythematous.     Left Ear: A middle ear effusion is present. Tympanic membrane is not erythematous.     Nose: Nose normal.     Mouth/Throat:     Lips: Pink.     Mouth: Mucous membranes are moist.     Pharynx: Posterior oropharyngeal erythema and postnasal drip present. No pharyngeal swelling, oropharyngeal exudate or uvula swelling.  Eyes:     Conjunctiva/sclera: Conjunctivae normal.     Pupils: Pupils are equal, round, and reactive to light.  Cardiovascular:     Rate and Rhythm: Normal rate and regular rhythm.     Heart sounds: Normal heart sounds.  Pulmonary:     Effort: Pulmonary effort is normal.     Breath sounds: Normal breath sounds.  Musculoskeletal:     Cervical back: Neck supple.     Right lower leg: No edema.     Left lower leg: No edema.  Skin:    General: Skin is warm and dry.     Capillary Refill: Capillary refill takes less than 2 seconds.  Neurological:     General: No focal deficit present.     Mental Status: He is alert and oriented to person, place, and time.  Psychiatric:        Mood and Affect: Mood  normal.        Behavior: Behavior normal. Behavior is cooperative.        Thought Content: Thought content normal.        Judgment: Judgment normal.     Results for orders placed or performed in visit on 03/11/23  Rapid Strep Screen ([facility] ONLY)   Collection Time: 03/11/23 11:51 AM   Specimen: Other   Other  Result Value Ref Range   Strep Gp A Ag, IA W/Reflex Negative Negative  Culture, Group A Strep   Collection Time: 03/11/23 11:51 AM   Other  Result Value Ref Range   Strep A Culture CANCELED   Culture, Group A Strep   Collection Time: 03/11/23  2:20 PM   Specimen: Throat   TH  Result Value Ref Range   Strep A Culture Negative        Pertinent labs & imaging results that were available during my care of the patient were reviewed by me and considered in my medical decision making.  Assessment & Plan:  Bessie was seen today for nasal congestion, sore throat, generalized body aches and cough.  Diagnoses and all orders for this visit:  Acute cough -     Veritor Flu A/B Waived -     COVID-19, Flu A+B and RSV -     promethazine-dextromethorphan (PROMETHAZINE-DM) 6.25-15 MG/5ML syrup; Take 5 mLs by mouth 4 (four) times daily as needed for cough.  Essential hypertension -     amLODipine (NORVASC) 5 MG tablet; TAKE 1 TABLET BY MOUTH DAILY -     hydrochlorothiazide (MICROZIDE) 12.5 MG capsule; Take 1 capsule (12.5 mg total) by mouth daily.  Gastroesophageal reflux disease without esophagitis -     Discontinue: esomeprazole (NEXIUM) 40 MG capsule; Take 1 capsule (40 mg total) by mouth daily. (NEEDS TO BE SEEN BEFORE NEXT REFILL) -     esomeprazole (NEXIUM)  40 MG capsule; Take 1 capsule (40 mg total) by mouth daily. (NEEDS TO BE SEEN BEFORE NEXT REFILL)  Influenza A -     promethazine-dextromethorphan (PROMETHAZINE-DM) 6.25-15 MG/5ML syrup; Take 5 mLs by mouth 4 (four) times daily as needed for cough.     Assessment and Plan    Influenza A Acute onset of  congestion, coughing, body aches, and fatigue since Monday night. Fever of 101.105F noted once. Positive for Influenza A. Symptoms present for more than two days, making antiviral treatment with Tamiflu ineffective. Discussed risks of sedation with promethazine DM for cough relief. - Recommend Tylenol and Motrin for symptom relief - Encourage increased fluid intake - Advise rest - Prescribe promethazine DM for cough with sedation precautions  Hypertension Chronic condition managed with hydrochlorothiazide and amlodipine. Last follow-up six months ago with Dr. Yehuda Mao. Patient requests a 90-day refill due to upcoming loss of insurance coverage. Discussed importance of maintaining blood pressure control and risks of uncontrolled hypertension. - Refill hydrochlorothiazide - Refill amlodipine - Advise patient to establish care with current provider within 30 days  Gastroesophageal Reflux Disease (GERD) Chronic condition managed with Nexium. Patient requests a refill due to upcoming loss of insurance coverage. Discussed importance of continued management to prevent symptoms and complications. - Refill Nexium  Follow-up - Schedule follow-up appointment within 30 days.          Continue all other maintenance medications.  Follow up plan: Return if symptoms worsen or fail to improve.   Continue healthy lifestyle choices, including diet (rich in fruits, vegetables, and lean proteins, and low in salt and simple carbohydrates) and exercise (at least 30 minutes of moderate physical activity daily).    The above assessment and management plan was discussed with the patient. The patient verbalized understanding of and has agreed to the management plan. Patient is aware to call the clinic if they develop any new symptoms or if symptoms persist or worsen. Patient is aware when to return to the clinic for a follow-up visit. Patient educated on when it is appropriate to go to the emergency department.    Kari Baars, FNP-C [facility] [phone]

## 2023-07-01 LAB — COVID-19, FLU A+B AND RSV
Influenza A, NAA: DETECTED — AB
Influenza B, NAA: NOT DETECTED
RSV, NAA: NOT DETECTED
SARS-CoV-2, NAA: NOT DETECTED

## 2023-07-03 ENCOUNTER — Telehealth: Payer: Managed Care, Other (non HMO) | Admitting: Nurse Practitioner

## 2023-07-03 DIAGNOSIS — J40 Bronchitis, not specified as acute or chronic: Secondary | ICD-10-CM

## 2023-07-03 MED ORDER — PREDNISONE 20 MG PO TABS: 20.0000 mg | ORAL_TABLET | Freq: Every day | ORAL | 0 refills | Status: AC

## 2023-07-03 MED ORDER — AZITHROMYCIN 250 MG PO TABS: ORAL_TABLET | ORAL | 0 refills | Status: AC

## 2023-07-03 NOTE — Progress Notes (Signed)
 I have spent 5 minutes in review of e-visit questionnaire, review and updating patient chart, medical decision making and response to patient.   Claiborne Rigg, NP

## 2023-07-03 NOTE — Progress Notes (Signed)
E-Visit for Cough   We are sorry that you are not feeling well.  Here is how we plan to help!  Based on your presentation I believe you most likely have A cough due to bacteria.  When patients have a fever and a productive cough with a change in color or increased sputum production, we are concerned about bacterial bronchitis.  If left untreated it can progress to pneumonia.  If your symptoms do not improve with your treatment plan it is important that you contact your provider.   I have prescribed Azithromyin 250 mg: two tablets now and then one tablet daily for 4 additonal days    In addition you may use A non-prescription cough medication called Robitussin DAC. Take 2 teaspoons every 8 hours or Delsym: take 2 teaspoons every 12 hours.  Prednisone 20 mg daily for 5 days.   From your responses in the eVisit questionnaire you describe inflammation in the upper respiratory tract which is causing a significant cough.  This is commonly called Bronchitis and has four common causes:   Allergies Viral Infections Acid Reflux Bacterial Infection Allergies, viruses and acid reflux are treated by controlling symptoms or eliminating the cause. An example might be a cough caused by taking certain blood pressure medications. You stop the cough by changing the medication. Another example might be a cough caused by acid reflux. Controlling the reflux helps control the cough.  USE OF BRONCHODILATOR ("RESCUE") INHALERS: There is a risk from using your bronchodilator too frequently.  The risk is that over-reliance on a medication which only relaxes the muscles surrounding the breathing tubes can reduce the effectiveness of medications prescribed to reduce swelling and congestion of the tubes themselves.  Although you feel brief relief from the bronchodilator inhaler, your asthma may actually be worsening with the tubes becoming more swollen and filled with mucus.  This can delay other crucial treatments, such as  oral steroid medications. If you need to use a bronchodilator inhaler daily, several times per day, you should discuss this with your provider.  There are probably better treatments that could be used to keep your asthma under control.     HOME CARE Only take medications as instructed by your medical team. Complete the entire course of an antibiotic. Drink plenty of fluids and get plenty of rest. Avoid close contacts especially the very young and the elderly Cover your mouth if you cough or cough into your sleeve. Always remember to wash your hands A steam or ultrasonic humidifier can help congestion.   GET HELP RIGHT AWAY IF: You develop worsening fever. You become short of breath You cough up blood. Your symptoms persist after you have completed your treatment plan MAKE SURE YOU  Understand these instructions. Will watch your condition. Will get help right away if you are not doing well or get worse.    Thank you for choosing an e-visit.  Your e-visit answers were reviewed by a board certified advanced clinical practitioner to complete your personal care plan. Depending upon the condition, your plan could have included both over the counter or prescription medications.  Please review your pharmacy choice. Make sure the pharmacy is open so you can pick up prescription now. If there is a problem, you may contact your provider through [employer] and have the prescription routed to another pharmacy.  Your safety is important to [place]. If you have drug allergies check your prescription carefully.   For the next 24 hours you can use MyChart  to ask questions about today's visit, request a non-urgent call back, or ask for a work or school excuse. You will get an email in the next two days asking about your experience. I hope that your e-visit has been valuable and will speed your recovery.

## 2023-07-19 ENCOUNTER — Encounter: Payer: Self-pay | Admitting: Family Medicine

## 2023-07-19 ENCOUNTER — Ambulatory Visit (INDEPENDENT_AMBULATORY_CARE_PROVIDER_SITE_OTHER): Payer: Self-pay | Admitting: Family Medicine

## 2023-07-19 VITALS — BP 141/89 | HR 83 | Temp 98.0°F | Ht 70.0 in | Wt 180.2 lb

## 2023-07-19 DIAGNOSIS — I10 Essential (primary) hypertension: Secondary | ICD-10-CM | POA: Diagnosis not present

## 2023-07-19 DIAGNOSIS — Z79899 Other long term (current) drug therapy: Secondary | ICD-10-CM | POA: Diagnosis not present

## 2023-07-19 DIAGNOSIS — F321 Major depressive disorder, single episode, moderate: Secondary | ICD-10-CM

## 2023-07-19 DIAGNOSIS — F409 Phobic anxiety disorder, unspecified: Secondary | ICD-10-CM

## 2023-07-19 DIAGNOSIS — K219 Gastro-esophageal reflux disease without esophagitis: Secondary | ICD-10-CM

## 2023-07-19 DIAGNOSIS — T887XXA Unspecified adverse effect of drug or medicament, initial encounter: Secondary | ICD-10-CM

## 2023-07-19 DIAGNOSIS — F5105 Insomnia due to other mental disorder: Secondary | ICD-10-CM

## 2023-07-19 DIAGNOSIS — T887XXD Unspecified adverse effect of drug or medicament, subsequent encounter: Secondary | ICD-10-CM

## 2023-07-19 DIAGNOSIS — F411 Generalized anxiety disorder: Secondary | ICD-10-CM

## 2023-07-19 DIAGNOSIS — E782 Mixed hyperlipidemia: Secondary | ICD-10-CM

## 2023-07-19 MED ORDER — FLUOXETINE HCL 20 MG PO CAPS: 60.0000 mg | ORAL_CAPSULE | Freq: Every day | ORAL | 3 refills | Status: DC

## 2023-07-19 MED ORDER — LOSARTAN POTASSIUM 50 MG PO TABS: 50.0000 mg | ORAL_TABLET | Freq: Every day | ORAL | 1 refills | Status: DC

## 2023-07-19 NOTE — Patient Instructions (Signed)
 If your symptoms worsen or you have thoughts of suicide/homicide, PLEASE SEEK IMMEDIATE MEDICAL ATTENTION.  You may always call the National Suicide Hotline.  This is available 24 hours a day, 7 days a week.  Their number is: 5810725763  Taking the medicine as directed and not missing any doses is one of the best things you can do to treat your depression.  Here are some things to keep in mind:  Side effects (stomach upset, some increased anxiety) may happen before you notice a benefit.  These side effects typically go away over time. Changes to your dose of medicine or a change in medication all together is sometimes necessary Most people need to be on medication at least 12 months Many people will notice an improvement within two weeks but the full effect of the medication can take up to 4-6 weeks Stopping the medication when you start feeling better often results in a return of symptoms Never discontinue your medication without contacting a health care professional first.  Some medications require gradual discontinuation/ taper and can make you sick if you stop them abruptly.  If your symptoms worsen or you have thoughts of suicide/homicide, PLEASE SEEK IMMEDIATE MEDICAL ATTENTION.  You may always call:  National Suicide Hotline: 440-761-4568 [place] Crisis Line: (424)588-3357 Crisis Recovery in [place]: 331-882-2643  These are available 24 hours a day, 7 days a week.

## 2023-07-19 NOTE — Progress Notes (Signed)
 Subjective:  Patient ID: Jacob Benson, male    DOB: 10-29-1985, 38 y.o.   MRN: [id number]  Patient Care Team: Sonny Masters, FNP as PCP - General (Family Medicine) Pamila Mendibles, Doralee Albino, FNP as Nurse Practitioner (Family Medicine)   Chief Complaint:  Establish Care (Previous Dettinger patient ) and Anxiety   HPI: Jacob Benson is a 38 y.o. male presenting on 07/19/2023 for Establish Care (Previous Dettinger patient ) and Anxiety   Discussed the use of AI scribe software for clinical note transcription with the patient, who gave verbal consent to proceed.  History of Present Illness   Jacob Benson is a 38 year old male who presents with medication management and anxiety symptoms.  He was previously under the care of Dr. Louanne Skye and Dr. Milinda Cave but switched due to insurance issues. His last visit with Dr. Milinda Cave was about six months ago.  He experiences anxiety attacks that are not daily but increase with higher stress levels. He uses Ativan for these episodes, starting at a half dose and increasing to 1 mg over the past year. In high-stress situations, he sometimes takes up to two tablets. His job involves [employer] work and frequent interactions with new people, which he finds challenging due to social anxiety.  He has tried hydroxyzine in the past, which was ineffective, and was previously on fluoxetine, which helped reduce the frequency of anxiety attacks. After switching to Vraylar, he experienced severe side effects, including dehydration, inability to eat, diarrhea, shaking, and insomnia, leading to hospitalization. He discontinued Vraylar and returned to fluoxetine, which he found beneficial, but his previous doctor was reluctant to continue it.  He currently takes Seroquel 50 mg at bedtime, Singulair for allergies, Ambien for sleep, and Ativan as needed for anxiety. Without Seroquel and Ambien, he struggles to sleep, and his anxiety worsens with lack of sleep. He has not  seen a psychiatrist before and feels discomfort discussing his issues with new people.  He denies depression or suicidal ideation but reports significant anxiety, particularly in social situations. He experiences insomnia and notes that his anxiety is worse when he does not sleep well.  He has a family history of anxiety and related issues, including his uncle, cousin, grandmother, mother, and father.         07/19/2023   12:11 PM 12/30/2022    4:22 PM 08/09/2019    2:00 PM 04/04/2019    4:39 PM  GAD 7 : Generalized Anxiety Score  Nervous, Anxious, on Edge 3 0 1 3  Control/stop worrying 3 0 1 3  Worry too much - different things 3 0 1 3  Trouble relaxing 3 0 1 2  Restless 3 0 1 3  Easily annoyed or irritable 3 0 1 3  Afraid - awful might happen 1 0 1 2  Total GAD 7 Score 19 0 7 19  Anxiety Difficulty Somewhat difficult Not difficult at all  Somewhat difficult       07/19/2023   12:10 PM 12/30/2022    4:22 PM 03/14/2020    3:50 PM 03/14/2020    3:46 PM 02/19/2020    4:01 PM  Depression screen PHQ 2/9  Decreased Interest 2 0 0 0 0  Down, Depressed, Hopeless 0 0 0 0 0  PHQ - 2 Score 2 0 0 0 0  Altered sleeping 3 0     Tired, decreased energy 3 0     Change in appetite 2 0  Feeling bad or failure about yourself  0 0     Trouble concentrating 3 0     Moving slowly or fidgety/restless 0 0     Suicidal thoughts 0 0     PHQ-9 Score 13 0     Difficult doing work/chores Somewhat difficult Not difficult at all          Relevant past medical, surgical, family, and social history reviewed and updated as indicated.  Allergies and medications reviewed and updated. Data reviewed: Chart in Epic.   Past Medical History:  Diagnosis Date   Calcium kidney stones 2007   Hyperlipidemia    Hypertension    Olecranon fracture    left   Shingles 2006    Past Surgical History:  Procedure Laterality Date   FRACTURE SURGERY     I & D EXTREMITY Left 09/13/2015   Procedure: IRRIGATION  AND DEBRIDEMENT EXTREMITY;  Surgeon: Tarry Kos, MD;  Location: [facility] OR;  Service: Orthopedics;  Laterality: Left;   ORIF ELBOW FRACTURE Left 10/30/2015   Procedure: OPEN REDUCTION INTERNAL FIXATION (ORIF) LEFT OLECRANON FRACTURE;  Surgeon: Tarry Kos, MD;  Location: [facility] OR;  Service: Orthopedics;  Laterality: Left;   ORIF HUMERUS FRACTURE Left 09/13/2015   Procedure: OPEN REDUCTION INTERNAL FIXATION (ORIF) ELBOW/OLECRANON AND I AND D;  Surgeon: Tarry Kos, MD;  Location: [facility] OR;  Service: Orthopedics;  Laterality: Left;   TONSILLECTOMY     VASECTOMY      Social History   Socioeconomic History   Marital status: Married    Spouse name: Chanda   Number of children: 2   Years of education: Not on file   Highest education level: 12th grade  Occupational History   Not on file  Tobacco Use   Smoking status: Former    Current packs/day: 0.00    Types: Cigarettes    Quit date: 05/23/2013    Years since quitting: 10.1   Smokeless tobacco: Former    Types: Snuff  Substance and Sexual Activity   Alcohol use: No    Alcohol/week: 0.0 standard drinks of alcohol   Drug use: No   Sexual activity: Yes    Birth control/protection: Surgical  Other Topics Concern   Not on file  Social History Narrative   Not on file   Social Drivers of Health   Financial Resource Strain: Not on file  Food Insecurity: Not on file  Transportation Needs: Not on file  Physical Activity: Unknown (03/11/2023)   Exercise Vital Sign    Days of Exercise per Week: Patient declined    Minutes of Exercise per Session: Not on file  Stress: Not on file  Social Connections: Unknown (01/29/2022)   Received from [facility], [facility]   Social Network    Social Network: Not on file  Intimate Partner Violence: Unknown (01/29/2022)   Received from [facility], [facility]   HITS    Physically Hurt: Not on file    Insult or Talk Down To: Not on file    Threaten Physical Harm: Not on file    Scream or Curse:  Not on file    Outpatient Encounter Medications as of 07/19/2023  Medication Sig   albuterol (VENTOLIN HFA) 108 (90 Base) MCG/ACT inhaler Inhale 2 puffs into the lungs every 6 (six) hours as needed.   esomeprazole (NEXIUM) 40 MG capsule Take 1 capsule (40 mg total) by mouth daily. (NEEDS TO BE SEEN BEFORE NEXT REFILL)   FLUoxetine (PROZAC) 20  MG capsule Take 3 capsules (60 mg total) by mouth daily. Take 20 mg daily for two weeks, then 40 mg daily for two weeks, then 60 mg daily thereafter   LORazepam (ATIVAN) 1 MG tablet Take 1 mg by mouth every 8 (eight) hours.   losartan (COZAAR) 50 MG tablet Take 1 tablet (50 mg total) by mouth daily.   montelukast (SINGULAIR) 10 MG tablet Take 1 tablet (10 mg total) by mouth at bedtime.   zolpidem (AMBIEN CR) 12.5 MG CR tablet Take 1 tablet (12.5 mg total) by mouth at bedtime as needed for sleep.   [DISCONTINUED] amLODipine (NORVASC) 5 MG tablet TAKE 1 TABLET BY MOUTH DAILY   [DISCONTINUED] hydrochlorothiazide (MICROZIDE) 12.5 MG capsule Take 1 capsule (12.5 mg total) by mouth daily.   [DISCONTINUED] promethazine-dextromethorphan (PROMETHAZINE-DM) 6.25-15 MG/5ML syrup Take 5 mLs by mouth 4 (four) times daily as needed for cough.   QUEtiapine (SEROQUEL) 50 MG tablet Take 1 tablet (50 mg total) by mouth at bedtime.   No facility-administered encounter medications on file as of 07/19/2023.    Allergies  Allergen Reactions   Hydrocodone Other (See Comments)   Guaifenesin & Derivatives Palpitations   Oxycodone Nausea And Vomiting    Pertinent ROS per HPI, otherwise unremarkable      Objective:  BP (!) 141/89   Pulse 83   Temp 98 F (36.7 C)   Ht 5\' 10"  (1.778 m)   Wt 180 lb 3.2 oz (81.7 kg)   SpO2 98%   BMI 25.86 kg/m    Wt Readings from Last 3 Encounters:  07/19/23 180 lb 3.2 oz (81.7 kg)  06/30/23 177 lb 3.2 oz (80.4 kg)  03/11/23 175 lb 12.8 oz (79.7 kg)    Physical Exam Vitals and nursing note reviewed.  Constitutional:      General:  He is not in acute distress.    Appearance: Normal appearance. He is normal weight. He is not ill-appearing, toxic-appearing or diaphoretic.  HENT:     Head: Normocephalic and atraumatic.     Nose: Nose normal.     Mouth/Throat:     Mouth: Mucous membranes are moist.  Eyes:     Conjunctiva/sclera: Conjunctivae normal.     Pupils: Pupils are equal, round, and reactive to light.  Cardiovascular:     Rate and Rhythm: Normal rate.  Pulmonary:     Effort: Pulmonary effort is normal.  Musculoskeletal:     Cervical back: Neck supple.     Right lower leg: No edema.     Left lower leg: No edema.  Skin:    General: Skin is warm and dry.     Capillary Refill: Capillary refill takes less than 2 seconds.  Neurological:     General: No focal deficit present.     Mental Status: He is alert and oriented to person, place, and time.  Psychiatric:        Attention and Perception: Attention and perception normal.        Mood and Affect: Mood is anxious.        Speech: Speech normal.        Behavior: Behavior normal. Behavior is cooperative.        Thought Content: Thought content normal.        Judgment: Judgment normal.      Results for orders placed or performed in visit on 06/30/23  COVID-19, Flu A+B and RSV   Collection Time: 06/30/23  8:44 AM   Specimen: Nasopharyngeal(NP) swabs in  vial transport medium  Result Value Ref Range   SARS-CoV-2, NAA Not Detected Not Detected   Influenza A, NAA Detected (A) Not Detected   Influenza B, NAA Not Detected Not Detected   RSV, NAA Not Detected Not Detected   Test Information: Comment   Veritor Flu A/B Waived   Collection Time: 06/30/23  8:44 AM  Result Value Ref Range   Influenza A Positive (A) Negative   Influenza B Negative Negative       Pertinent labs & imaging results that were available during my care of the patient were reviewed by me and considered in my medical decision making.  Assessment & Plan:  Jacob Benson was seen today for  establish care and anxiety.  Diagnoses and all orders for this visit:  MDD (major depressive disorder), single episode, moderate (HCC) -     Ambulatory referral to Psychiatry -     CMP14+EGFR -     CBC with Differential/Platelet -     Thyroid Panel With TSH -     VITAMIN D 25 Hydroxy (Vit-D Deficiency, Fractures) -     FLUoxetine (PROZAC) 20 MG capsule; Take 3 capsules (60 mg total) by mouth daily. Take 20 mg daily for two weeks, then 40 mg daily for two weeks, then 60 mg daily thereafter  GAD (generalized anxiety disorder) -     Ambulatory referral to Psychiatry -     CMP14+EGFR -     CBC with Differential/Platelet -     Thyroid Panel With TSH -     VITAMIN D 25 Hydroxy (Vit-D Deficiency, Fractures) -     FLUoxetine (PROZAC) 20 MG capsule; Take 3 capsules (60 mg total) by mouth daily. Take 20 mg daily for two weeks, then 40 mg daily for two weeks, then 60 mg daily thereafter  Insomnia due to anxiety and fear -     Ambulatory referral to Psychiatry -     CMP14+EGFR -     CBC with Differential/Platelet -     Thyroid Panel With TSH -     VITAMIN D 25 Hydroxy (Vit-D Deficiency, Fractures) -     Ambulatory referral to Sleep Studies -     ToxASSURE Select 13 (MW), Urine  Controlled substance agreement signed -     ToxASSURE Select 13 (MW), Urine  Primary hypertension -     CMP14+EGFR -     CBC with Differential/Platelet -     Lipid panel -     Thyroid Panel With TSH -     losartan (COZAAR) 50 MG tablet; Take 1 tablet (50 mg total) by mouth daily.  Mixed hyperlipidemia -     CMP14+EGFR -     Lipid panel -     Thyroid Panel With TSH  Gastroesophageal reflux disease without esophagitis -     CBC with Differential/Platelet  Medication side effects Unable to tolerate Vraylar    Assessment and Plan    Anxiety Disorder Experiences anxiety attacks, particularly under high stress. Previous treatments included hydroxyzine, fluoxetine, and Vraylar, with significant adverse  effects from [employer]. Currently on Ativan for acute anxiety relief but has been taking more than prescribed, raising concerns about potential benzodiazepine dependence. Open to trying fluoxetine again, which was previously beneficial. Benzodiazepines are not suitable for long-term use due to lack of efficacy and potential for dependence. Emphasized the importance of identifying underlying triggers and considering SSRIs like fluoxetine for long-term management. - Refer to psychiatry for evaluation and management. - Initiate fluoxetine at  20 mg daily for 2 weeks, then increase to 40 mg daily for 2 weeks, and finally to 60 mg daily. - Advise gradual reduction of Ativan use to prevent withdrawal symptoms. - Discuss potential benefits of identifying anxiety triggers and long-term management strategies.  Insomnia Reports difficulty sleeping, which may contribute to anxiety. Currently taking Ambien and Seroquel at night, with variable effectiveness. Open to a sleep study to evaluate for potential sleep apnea, which could exacerbate anxiety and insomnia. - Continue Ambien and Seroquel at night. - Consider a sleep study to evaluate for sleep apnea.  Hypertension Managed with hydrochlorothiazide and amlodipine since 2013. Current blood pressure is slightly elevated, possibly due to dietary choices and anxiety. Plans to transition to losartan to simplify the regimen and potentially improve control. Discussed potential side effects of amlodipine, such as leg swelling, and the rationale for switching to losartan. - Initiate losartan 50 mg daily. - Taper amlodipine by taking it every other day for a week, then discontinue. - Discontinue hydrochlorothiazide.  General Health Maintenance Plans to conduct blood work to rule out underlying causes of increased anxiety and depression, such as thyroid dysfunction. - Order blood work to evaluate for underlying causes of anxiety and depression.  Follow-up Advised to  follow up in 6 to 8 weeks to assess the effectiveness of the new medication regimen and overall management plan. - Schedule follow-up appointment in 6 to 8 weeks. - Arrange for blood work prior to the follow-up appointment.        Total time spent with patient today was 45 minutes.    Continue all other maintenance medications.  Follow up plan: Return in about 6 weeks (around 08/30/2023), or if symptoms worsen or fail to improve, for HTN, GAD, Depression.   Continue healthy lifestyle choices, including diet (rich in fruits, vegetables, and lean proteins, and low in salt and simple carbohydrates) and exercise (at least 30 minutes of moderate physical activity daily).  Educational handout given for depression, hotline numbers.   The above assessment and management plan was discussed with the patient. The patient verbalized understanding of and has agreed to the management plan. Patient is aware to call the clinic if they develop any new symptoms or if symptoms persist or worsen. Patient is aware when to return to the clinic for a follow-up visit. Patient educated on when it is appropriate to go to the emergency department.   Kari Baars, FNP-C [facility] [phone]

## 2023-07-20 ENCOUNTER — Encounter: Payer: Self-pay | Admitting: Family Medicine

## 2023-07-20 LAB — CMP14+EGFR
ALT: 23 IU/L (ref 0–44)
AST: 20 IU/L (ref 0–40)
Albumin: 4.5 g/dL (ref 4.1–5.1)
Alkaline Phosphatase: 72 IU/L (ref 44–121)
BUN/Creatinine Ratio: 13 (ref 9–20)
BUN: 16 mg/dL (ref 6–20)
Bilirubin Total: 0.3 mg/dL (ref 0.0–1.2)
CO2: 27 mmol/L (ref 20–29)
Calcium: 9.5 mg/dL (ref 8.7–10.2)
Chloride: 99 mmol/L (ref 96–106)
Creatinine, Ser: 1.26 mg/dL (ref 0.76–1.27)
Globulin, Total: 2.3 g/dL (ref 1.5–4.5)
Glucose: 91 mg/dL (ref 70–99)
Potassium: 4.1 mmol/L (ref 3.5–5.2)
Sodium: 140 mmol/L (ref 134–144)
Total Protein: 6.8 g/dL (ref 6.0–8.5)
eGFR: 75 mL/min/{1.73_m2} (ref >59)

## 2023-07-20 LAB — VITAMIN D 25 HYDROXY (VIT D DEFICIENCY, FRACTURES): Vit D, 25-Hydroxy: 31.1 ng/mL (ref 30.0–100.0)

## 2023-07-20 LAB — LIPID PANEL
Chol/HDL Ratio: 4.9 ratio (ref 0.0–5.0)
Cholesterol, Total: 207 mg/dL — ABNORMAL HIGH (ref 100–199)
HDL: 42 mg/dL (ref >39)
LDL Chol Calc (NIH): 128 mg/dL — ABNORMAL HIGH (ref 0–99)
Triglycerides: 210 mg/dL — ABNORMAL HIGH (ref 0–149)
VLDL Cholesterol Cal: 37 mg/dL (ref 5–40)

## 2023-07-20 LAB — THYROID PANEL WITH TSH
Free Thyroxine Index: 1.8 (ref 1.2–4.9)
T3 Uptake Ratio: 27 % (ref 24–39)
T4, Total: 6.5 ug/dL (ref 4.5–12.0)
TSH: 2.55 u[IU]/mL (ref 0.450–4.500)

## 2023-07-20 LAB — CBC WITH DIFFERENTIAL/PLATELET
Basophils Absolute: 0.1 10*3/uL (ref 0.0–0.2)
Basos: 1 %
EOS (ABSOLUTE): 0.1 10*3/uL (ref 0.0–0.4)
Eos: 1 %
Hematocrit: 46.6 % (ref 37.5–51.0)
Hemoglobin: 15.6 g/dL (ref 13.0–17.7)
Immature Grans (Abs): 0 10*3/uL (ref 0.0–0.1)
Immature Granulocytes: 1 %
Lymphocytes Absolute: 1.9 10*3/uL (ref 0.7–3.1)
Lymphs: 23 %
MCH: 29.7 pg (ref 26.6–33.0)
MCHC: 33.5 g/dL (ref 31.5–35.7)
MCV: 89 fL (ref 79–97)
Monocytes Absolute: 0.6 10*3/uL (ref 0.1–0.9)
Monocytes: 7 %
Neutrophils Absolute: 5.8 10*3/uL (ref 1.4–7.0)
Neutrophils: 67 %
Platelets: 320 10*3/uL (ref 150–450)
RBC: 5.25 x10E6/uL (ref 4.14–5.80)
RDW: 13.6 % (ref 11.6–15.4)
WBC: 8.5 10*3/uL (ref 3.4–10.8)

## 2023-07-21 LAB — TOXASSURE SELECT 13 (MW), URINE

## 2023-08-16 ENCOUNTER — Ambulatory Visit: Admitting: Family Medicine

## 2023-08-18 ENCOUNTER — Ambulatory Visit: Admitting: Family Medicine

## 2023-08-18 ENCOUNTER — Encounter: Payer: Self-pay | Admitting: Family Medicine

## 2023-08-18 VITALS — BP 126/69 | HR 72 | Temp 97.7°F | Ht 70.0 in | Wt 175.0 lb

## 2023-08-18 DIAGNOSIS — F321 Major depressive disorder, single episode, moderate: Secondary | ICD-10-CM

## 2023-08-18 DIAGNOSIS — I10 Essential (primary) hypertension: Secondary | ICD-10-CM

## 2023-08-18 DIAGNOSIS — F5105 Insomnia due to other mental disorder: Secondary | ICD-10-CM | POA: Diagnosis not present

## 2023-08-18 DIAGNOSIS — F409 Phobic anxiety disorder, unspecified: Secondary | ICD-10-CM

## 2023-08-18 DIAGNOSIS — F411 Generalized anxiety disorder: Secondary | ICD-10-CM | POA: Diagnosis not present

## 2023-08-18 LAB — BMP8+EGFR
BUN/Creatinine Ratio: 13 (ref 9–20)
BUN: 16 mg/dL (ref 6–20)
CO2: 25 mmol/L (ref 20–29)
Calcium: 9.6 mg/dL (ref 8.7–10.2)
Chloride: 102 mmol/L (ref 96–106)
Creatinine, Ser: 1.28 mg/dL — ABNORMAL HIGH (ref 0.76–1.27)
Glucose: 56 mg/dL — ABNORMAL LOW (ref 70–99)
Potassium: 4.1 mmol/L (ref 3.5–5.2)
Sodium: 141 mmol/L (ref 134–144)
eGFR: 74 mL/min/{1.73_m2} (ref >59)

## 2023-08-18 NOTE — Progress Notes (Signed)
 Subjective:  Patient ID: Jacob Benson, male    DOB: 1985-09-12, 38 y.o.   MRN: [id number]  Patient Care Team: Sonny Masters, FNP as PCP - General (Family Medicine) Vishal Sandlin, Doralee Albino, FNP as Nurse Practitioner (Family Medicine)   Chief Complaint:  Anxiety and Depression (Follow up )   HPI: Jacob Benson is a 38 y.o. male presenting on 08/18/2023 for Anxiety and Depression (Follow up )   History of Present Illness   Jacob Benson is a 38 year old male who presents for medication management and follow-up on sleep study results.  He started a new job six weeks ago, which he describes as 'very stressful,' contributing to his anxiety. He is on fluoxetine, recently increased to 60 mg daily from 40 mg, with a 'little bit' of improvement in symptoms. He also takes lorazepam, reduced from four to two tablets daily, and is considering further tapering. He has not yet followed up with behavioral health due to losing the contact number, but plans to do so.  He has a history of hypertension and recently started losartan 50 mg daily. No side effects such as headaches, chest pain, or leg swelling are noted, but he mentions increased urination initially. His blood pressure is reportedly well-controlled with the current medication regimen.  A recent sleep study indicated mild sleep apnea. A CPAP machine was deemed unnecessary, but a mouth guard was recommended. Due to cost, he opted for an over-the-counter mouth guard from [employer]. He continues to experience disrupted sleep, waking up frequently, which he attributes to episodes of apnea when sleeping on his back. He finds it challenging to maintain a side-sleeping position during sleep.  He continues to take Seroquel and Ambien for sleep. He wants to reduce medication use, especially when refills are delayed, affecting his sleep quality.         08/18/2023    8:06 AM 07/19/2023   12:11 PM 12/30/2022    4:22 PM 08/09/2019    2:00 PM  GAD 7 :  Generalized Anxiety Score  Nervous, Anxious, on Edge 3 3 0 1  Control/stop worrying 2 3 0 1  Worry too much - different things 2 3 0 1  Trouble relaxing 3 3 0 1  Restless 2 3 0 1  Easily annoyed or irritable 3 3 0 1  Afraid - awful might happen 0 1 0 1  Total GAD 7 Score 15 19 0 7  Anxiety Difficulty Somewhat difficult Somewhat difficult Not difficult at all        08/18/2023    8:06 AM 07/19/2023   12:10 PM 12/30/2022    4:22 PM 03/14/2020    3:50 PM 03/14/2020    3:46 PM  Depression screen PHQ 2/9  Decreased Interest 1 2 0 0 0  Down, Depressed, Hopeless 0 0 0 0 0  PHQ - 2 Score 1 2 0 0 0  Altered sleeping 3 3 0    Tired, decreased energy 0 3 0    Change in appetite 1 2 0    Feeling bad or failure about yourself  1 0 0    Trouble concentrating 1 3 0    Moving slowly or fidgety/restless 2 0 0    Suicidal thoughts 0 0 0    PHQ-9 Score 9 13 0    Difficult doing work/chores Somewhat difficult Somewhat difficult Not difficult at all         Relevant past medical, surgical, family, and  social history reviewed and updated as indicated.  Allergies and medications reviewed and updated. Data reviewed: Chart in Epic.   Past Medical History:  Diagnosis Date   Calcium kidney stones 2007   Hyperlipidemia    Hypertension    Olecranon fracture    left   Shingles 2006    Past Surgical History:  Procedure Laterality Date   FRACTURE SURGERY     I & D EXTREMITY Left 09/13/2015   Procedure: IRRIGATION AND DEBRIDEMENT EXTREMITY;  Surgeon: Tarry Kos, MD;  Location: [facility] OR;  Service: Orthopedics;  Laterality: Left;   ORIF ELBOW FRACTURE Left 10/30/2015   Procedure: OPEN REDUCTION INTERNAL FIXATION (ORIF) LEFT OLECRANON FRACTURE;  Surgeon: Tarry Kos, MD;  Location: [facility] OR;  Service: Orthopedics;  Laterality: Left;   ORIF HUMERUS FRACTURE Left 09/13/2015   Procedure: OPEN REDUCTION INTERNAL FIXATION (ORIF) ELBOW/OLECRANON AND I AND D;  Surgeon: Tarry Kos, MD;  Location: [facility] OR;   Service: Orthopedics;  Laterality: Left;   TONSILLECTOMY     VASECTOMY      Social History   Socioeconomic History   Marital status: Married    Spouse name: Chanda   Number of children: 2   Years of education: Not on file   Highest education level: 12th grade  Occupational History   Not on file  Tobacco Use   Smoking status: Former    Current packs/day: 0.00    Types: Cigarettes    Quit date: 05/23/2013    Years since quitting: 10.2   Smokeless tobacco: Former    Types: Snuff  Substance and Sexual Activity   Alcohol use: No    Alcohol/week: 0.0 standard drinks of alcohol   Drug use: No   Sexual activity: Yes    Birth control/protection: Surgical  Other Topics Concern   Not on file  Social History Narrative   Not on file   Social Drivers of Health   Financial Resource Strain: Not on file  Food Insecurity: Not on file  Transportation Needs: Not on file  Physical Activity: Unknown (03/11/2023)   Exercise Vital Sign    Days of Exercise per Week: Patient declined    Minutes of Exercise per Session: Not on file  Stress: Not on file  Social Connections: Unknown (01/29/2022)   Received from [facility], [facility]   Social Network    Social Network: Not on file  Intimate Partner Violence: Unknown (01/29/2022)   Received from [facility], [facility]   HITS    Physically Hurt: Not on file    Insult or Talk Down To: Not on file    Threaten Physical Harm: Not on file    Scream or Curse: Not on file    Outpatient Encounter Medications as of 08/18/2023  Medication Sig   albuterol (VENTOLIN HFA) 108 (90 Base) MCG/ACT inhaler Inhale 2 puffs into the lungs every 6 (six) hours as needed.   esomeprazole (NEXIUM) 40 MG capsule Take 1 capsule (40 mg total) by mouth daily. (NEEDS TO BE SEEN BEFORE NEXT REFILL)   FLUoxetine (PROZAC) 20 MG capsule Take 3 capsules (60 mg total) by mouth daily. Take 20 mg daily for two weeks, then 40 mg daily for two weeks, then 60 mg daily  thereafter   LORazepam (ATIVAN) 1 MG tablet Take 1 mg by mouth every 8 (eight) hours.   losartan (COZAAR) 50 MG tablet Take 1 tablet (50 mg total) by mouth daily.   montelukast (SINGULAIR) 10 MG tablet Take 1  tablet (10 mg total) by mouth at bedtime.   zolpidem (AMBIEN CR) 12.5 MG CR tablet Take 1 tablet (12.5 mg total) by mouth at bedtime as needed for sleep.   QUEtiapine (SEROQUEL) 50 MG tablet Take 1 tablet (50 mg total) by mouth at bedtime.   No facility-administered encounter medications on file as of 08/18/2023.    Allergies  Allergen Reactions   Hydrocodone Other (See Comments)   Guaifenesin & Derivatives Palpitations   Oxycodone Nausea And Vomiting    Pertinent ROS per HPI, otherwise unremarkable      Objective:  BP 126/69   Pulse 72   Temp 97.7 F (36.5 C)   Ht 5\' 10"  (1.778 m)   Wt 175 lb (79.4 kg)   SpO2 98%   BMI 25.11 kg/m    Wt Readings from Last 3 Encounters:  08/18/23 175 lb (79.4 kg)  07/19/23 180 lb 3.2 oz (81.7 kg)  06/30/23 177 lb 3.2 oz (80.4 kg)    Physical Exam Vitals and nursing note reviewed.  Constitutional:      General: He is not in acute distress.    Appearance: Normal appearance. He is normal weight. He is not ill-appearing or diaphoretic.  HENT:     Head: Normocephalic and atraumatic.     Nose: Nose normal.     Mouth/Throat:     Mouth: Mucous membranes are moist.  Eyes:     Conjunctiva/sclera: Conjunctivae normal.     Pupils: Pupils are equal, round, and reactive to light.  Cardiovascular:     Rate and Rhythm: Normal rate.  Pulmonary:     Effort: Pulmonary effort is normal.  Musculoskeletal:     Cervical back: Neck supple.  Neurological:     General: No focal deficit present.     Mental Status: He is alert and oriented to person, place, and time.  Psychiatric:        Attention and Perception: Attention and perception normal.        Mood and Affect: Mood is anxious.        Speech: Speech normal.        Behavior: Behavior  normal. Behavior is cooperative.        Thought Content: Thought content normal.        Cognition and Memory: Cognition and memory normal.        Judgment: Judgment normal.      Results for orders placed or performed in visit on 07/19/23  ToxASSURE Select 13 (MW), Urine   Collection Time: 07/19/23 12:47 PM  Result Value Ref Range   Summary FINAL   CMP14+EGFR   Collection Time: 07/19/23 12:50 PM  Result Value Ref Range   Glucose 91 70 - 99 mg/dL   BUN 16 6 - 20 mg/dL   Creatinine, Ser 1.19 0.76 - 1.27 mg/dL   eGFR 75 >14 NW/GNF/6.21   BUN/Creatinine Ratio 13 9 - 20   Sodium 140 134 - 144 mmol/L   Potassium 4.1 3.5 - 5.2 mmol/L   Chloride 99 96 - 106 mmol/L   CO2 27 20 - 29 mmol/L   Calcium 9.5 8.7 - 10.2 mg/dL   Total Protein 6.8 6.0 - 8.5 g/dL   Albumin 4.5 4.1 - 5.1 g/dL   Globulin, Total 2.3 1.5 - 4.5 g/dL   Bilirubin Total 0.3 0.0 - 1.2 mg/dL   Alkaline Phosphatase 72 44 - 121 IU/L   AST 20 0 - 40 IU/L   ALT 23 0 - 44 IU/L  CBC with Differential/Platelet   Collection Time: 07/19/23 12:50 PM  Result Value Ref Range   WBC 8.5 3.4 - 10.8 x10E3/uL   RBC 5.25 4.14 - 5.80 x10E6/uL   Hemoglobin 15.6 13.0 - 17.7 g/dL   Hematocrit 47.8 29.5 - 51.0 %   MCV 89 79 - 97 fL   MCH 29.7 26.6 - 33.0 pg   MCHC 33.5 31.5 - 35.7 g/dL   RDW 62.1 30.8 - 65.7 %   Platelets 320 150 - 450 x10E3/uL   Neutrophils 67 Not Estab. %   Lymphs 23 Not Estab. %   Monocytes 7 Not Estab. %   Eos 1 Not Estab. %   Basos 1 Not Estab. %   Neutrophils Absolute 5.8 1.4 - 7.0 x10E3/uL   Lymphocytes Absolute 1.9 0.7 - 3.1 x10E3/uL   Monocytes Absolute 0.6 0.1 - 0.9 x10E3/uL   EOS (ABSOLUTE) 0.1 0.0 - 0.4 x10E3/uL   Basophils Absolute 0.1 0.0 - 0.2 x10E3/uL   Immature Granulocytes 1 Not Estab. %   Immature Grans (Abs) 0.0 0.0 - 0.1 x10E3/uL  Lipid panel   Collection Time: 07/19/23 12:50 PM  Result Value Ref Range   Cholesterol, Total 207 (H) 100 - 199 mg/dL   Triglycerides 846 (H) 0 - 149 mg/dL    HDL 42 >96 mg/dL   VLDL Cholesterol Cal 37 5 - 40 mg/dL   LDL Chol Calc (NIH) 295 (H) 0 - 99 mg/dL   Chol/HDL Ratio 4.9 0.0 - 5.0 ratio  Thyroid Panel With TSH   Collection Time: 07/19/23 12:50 PM  Result Value Ref Range   TSH 2.550 0.450 - 4.500 uIU/mL   T4, Total 6.5 4.5 - 12.0 ug/dL   T3 Uptake Ratio 27 24 - 39 %   Free Thyroxine Index 1.8 1.2 - 4.9  VITAMIN D 25 Hydroxy (Vit-D Deficiency, Fractures)   Collection Time: 07/19/23 12:50 PM  Result Value Ref Range   Vit D, 25-Hydroxy 31.1 30.0 - 100.0 ng/mL       Pertinent labs & imaging results that were available during my care of the patient were reviewed by me and considered in my medical decision making.  Assessment & Plan:  Harrington was seen today for anxiety and depression.  Diagnoses and all orders for this visit:  MDD (major depressive disorder), single episode, moderate (HCC) GAD (generalized anxiety disorder) Insomnia due to anxiety and fear Pt to call psychiatry to schedule an appointment.   Primary hypertension -     BMP8+EGFR     Assessment and Plan    Anxiety Experiencing anxiety potentially exacerbated by a new, stressful job. Currently on fluoxetine, recently increased to 60 mg, with some improvement. Lorazepam usage reduced from four to two tablets per day. Symptoms may be related to untreated ADHD rather than anxiety, as suggested by colleagues and behavioral health. - Continue fluoxetine 60 mg daily - Reduce lorazepam to half a tablet two or three times a day - Contact behavioral health for further evaluation and management - Consider evaluation for ADHD by behavioral health  Insomnia Taking Seroquel and Ambien for sleep. Desires to reduce medication dependency, especially when refills are delayed. Behavioral health may assist in addressing underlying issues and reducing medication reliance. - Continue current sleep medications - Discuss with behavioral health the possibility of reducing sleep  medication dependency  Mild Sleep Apnea Diagnosed with mild sleep apnea, not requiring CPAP. Advised to use a mouth guard, but due to cost, opted for an over-the-counter version. Apnea episodes  occur primarily when sleeping on his back. Behavioral health recommended to address sleep issues. - Use a pulse oximeter to monitor oxygen levels during sleep - Contact behavioral health for sleep-related issues  Hypertension On losartan 50 mg daily for hypertension. Reports no side effects such as headaches, chest pain, or leg swelling. Plans to monitor kidney function due to the initiation of losartan, especially since increased urination was noted initially. - Check kidney function with a BMP          Continue all other maintenance medications.  Follow up plan: Return if symptoms worsen or fail to improve.   Continue healthy lifestyle choices, including diet (rich in fruits, vegetables, and lean proteins, and low in salt and simple carbohydrates) and exercise (at least 30 minutes of moderate physical activity daily).    The above assessment and management plan was discussed with the patient. The patient verbalized understanding of and has agreed to the management plan. Patient is aware to call the clinic if they develop any new symptoms or if symptoms persist or worsen. Patient is aware when to return to the clinic for a follow-up visit. Patient educated on when it is appropriate to go to the emergency department.   Kari Baars, FNP-C [facility] [phone]

## 2023-08-18 NOTE — Patient Instructions (Addendum)
[facility] [place] [address] - [place] [address] [phone] 

## 2023-08-19 ENCOUNTER — Other Ambulatory Visit: Payer: Self-pay

## 2023-08-19 DIAGNOSIS — I10 Essential (primary) hypertension: Secondary | ICD-10-CM

## 2023-08-19 DIAGNOSIS — N289 Disorder of kidney and ureter, unspecified: Secondary | ICD-10-CM

## 2023-08-26 ENCOUNTER — Other Ambulatory Visit: Payer: Self-pay | Admitting: Family Medicine

## 2023-08-26 ENCOUNTER — Encounter: Payer: Self-pay | Admitting: Family Medicine

## 2023-08-26 DIAGNOSIS — F411 Generalized anxiety disorder: Secondary | ICD-10-CM

## 2023-08-26 MED ORDER — LORAZEPAM 1 MG PO TABS: 0.5000 mg | ORAL_TABLET | Freq: Three times a day (TID) | ORAL | 0 refills | Status: DC | PRN

## 2023-08-26 NOTE — Progress Notes (Signed)
 Pt has not been able to get in to see psychiatry. Will give one refill of lorazepam to bridge him until he sees psychiatry.

## 2023-09-01 ENCOUNTER — Other Ambulatory Visit

## 2023-09-01 DIAGNOSIS — N289 Disorder of kidney and ureter, unspecified: Secondary | ICD-10-CM

## 2023-09-02 LAB — BASIC METABOLIC PANEL WITH GFR
BUN/Creatinine Ratio: 15 (ref 9–20)
BUN: 20 mg/dL (ref 6–20)
CO2: 24 mmol/L (ref 20–29)
Calcium: 9.5 mg/dL (ref 8.7–10.2)
Chloride: 99 mmol/L (ref 96–106)
Creatinine, Ser: 1.37 mg/dL — ABNORMAL HIGH (ref 0.76–1.27)
Glucose: 81 mg/dL (ref 70–99)
Potassium: 4.5 mmol/L (ref 3.5–5.2)
Sodium: 139 mmol/L (ref 134–144)
eGFR: 68 mL/min/{1.73_m2} (ref >59)

## 2023-09-26 NOTE — Telephone Encounter (Signed)
 Jacob Benson had ordered an ultrasound renal artery duplex and I don't see it has been scheduled-this is what pt is referring to -can you check on this?

## 2023-09-28 ENCOUNTER — Telehealth: Payer: Self-pay | Admitting: Family Medicine

## 2023-09-28 NOTE — Telephone Encounter (Unsigned)
 Copied from CRM [id number]. Topic: General - Other >> Sep 28, 2023  2:27 PM Felizardo Hotter wrote: Reason for CRM: Jacob Benson with Radiology called for [facility]. Pt needs to be scheduled with Vascular Lab. Please call pt at [phone].

## 2023-09-29 NOTE — Telephone Encounter (Signed)
 I talked with the Vascualr Department and they state they only schedule these exams if the Patient has hypertension or if the MD is concerned for renal stenosis. If either of these is what your looking for the order will need to be changed to VAS Renal Artery with the DX changed to one of the above. If neither applies to Patient then an US  Renal needs to be ordered.

## 2023-10-02 NOTE — Addendum Note (Signed)
 Addended by: Galvin Jules on: 10/02/2023 09:34 PM   Modules accepted: Orders

## 2023-10-04 ENCOUNTER — Ambulatory Visit (HOSPITAL_COMMUNITY): Admission: RE | Admit: 2023-10-04 | Source: Ambulatory Visit

## 2023-10-04 NOTE — Telephone Encounter (Signed)
 Pt called stating that he is upset that no one notified him that his appt was cancelled, he arrived at [facility] and was turned away. Please advise, new appt needs to be scheduled with updated orders. Please notify patient once appt is set.   Best contact: [phone]

## 2023-10-05 NOTE — Telephone Encounter (Signed)
 New Order is correct for Scheduling :)  I reached out to Dorothy at the Vascular Lab in [place] she states she will reach out to the Patient and get him scheduled :)  Thanks for your help :)

## 2023-10-13 ENCOUNTER — Telehealth (HOSPITAL_COMMUNITY): Payer: Self-pay

## 2023-10-13 NOTE — Telephone Encounter (Signed)
 10/17/23 appt confirmed by pt

## 2023-10-17 ENCOUNTER — Ambulatory Visit (HOSPITAL_COMMUNITY): Admitting: Registered Nurse

## 2023-10-17 ENCOUNTER — Encounter (HOSPITAL_COMMUNITY): Payer: Self-pay | Admitting: Registered Nurse

## 2023-10-17 VITALS — BP 119/75 | HR 72 | Ht 70.0 in | Wt 176.6 lb

## 2023-10-17 DIAGNOSIS — G4700 Insomnia, unspecified: Secondary | ICD-10-CM

## 2023-10-17 DIAGNOSIS — F410 Panic disorder [episodic paroxysmal anxiety] without agoraphobia: Secondary | ICD-10-CM

## 2023-10-17 DIAGNOSIS — F411 Generalized anxiety disorder: Secondary | ICD-10-CM

## 2023-10-17 DIAGNOSIS — F332 Major depressive disorder, recurrent severe without psychotic features: Secondary | ICD-10-CM

## 2023-10-17 MED ORDER — QUETIAPINE FUMARATE ER 50 MG PO TB24: 100.0000 mg | ORAL_TABLET | Freq: Every day | ORAL | 1 refills | Status: DC

## 2023-10-17 MED ORDER — FLUOXETINE HCL 10 MG PO CAPS: 10.0000 mg | ORAL_CAPSULE | Freq: Every day | ORAL | 0 refills | Status: DC

## 2023-10-17 MED ORDER — FLUOXETINE HCL 20 MG PO CAPS: 60.0000 mg | ORAL_CAPSULE | Freq: Every day | ORAL | 0 refills | Status: DC

## 2023-10-17 MED ORDER — LORAZEPAM 0.5 MG PO TABS: 0.5000 mg | ORAL_TABLET | Freq: Two times a day (BID) | ORAL | 0 refills | Status: DC | PRN

## 2023-10-17 MED ORDER — BUSPIRONE HCL 5 MG PO TABS: 5.0000 mg | ORAL_TABLET | Freq: Two times a day (BID) | ORAL | 0 refills | Status: DC

## 2023-10-17 MED ORDER — SERTRALINE HCL 25 MG PO TABS: ORAL_TABLET | ORAL | 1 refills | Status: DC

## 2023-10-17 NOTE — Progress Notes (Signed)
 Psychiatric Initial Adult Assessment   Patient Identification: Jacob Benson MRN:  [id number] Date of Evaluation:  10/17/2023 Referral Source: Primary care provider Chief Complaint:   Chief Complaint  Patient presents with   Establish Care    Medication management   Visit Diagnosis:    ICD-10-CM   1. Insomnia, unspecified type  G47.00 QUEtiapine  (SEROQUEL  XR) 50 MG TB24 24 hr tablet    busPIRone (BUSPAR) 5 MG tablet    LORazepam  (ATIVAN ) 0.5 MG tablet    2. Moderately severe recurrent major depression (HCC)  F33.2 QUEtiapine  (SEROQUEL  XR) 50 MG TB24 24 hr tablet    FLUoxetine  (PROZAC ) 20 MG capsule    sertraline  (ZOLOFT ) 25 MG tablet    FLUoxetine  (PROZAC ) 10 MG capsule    3. Generalized anxiety disorder with panic attacks  F41.1 QUEtiapine  (SEROQUEL  XR) 50 MG TB24 24 hr tablet   F41.0 FLUoxetine  (PROZAC ) 20 MG capsule    sertraline  (ZOLOFT ) 25 MG tablet    FLUoxetine  (PROZAC ) 10 MG capsule      History of Present Illness:  [name] 38 y.o. male presents to office today to establish care for medication management.  He is seen face to face by this provider, and chart reviewed on 10/17/23.  His psychiatric history is significant for general anxiety disorder, panic attacks, and major depressive disorder.  His mental health is currently managed with Seroquel  50 mg nightly, Prozac  60 mg daily, Ambien  12.5 mg nightly, and Ativan  0.5 mg every 8 hours as needed.  He reports his current medication regimen is not controlling his anxiety or sleep.  Reports he asked his primary care provider to increase Ativan  related to it being the only thing that helps with panic attacks.  He reports the only other psychotropic medication he has tried is Wellsite geologist but had a very bad reaction to it.  "It almost killed me.  It made my anxiety attacks worse, could not E, became suicidal.  I mean it was bad."  He also reports that he is not sleeping well and the only way he did get any sleep I was able to  fall asleep is when he took the Ativan  along with the Seroquel  and Ambien .  "Without the Ativan  I may have to lay in the bed 1 to 2 hours before I can fall asleep.  Is like my brain does not turn off.  It is nothing specific just random thoughts but the more try to turn it off the worse he gets."  He reports that there are really no current stressors going on in his life that causes anxiety or panic attacks.  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 He denies a prior history of suicide attempt, self injures behavior, psychiatric hospitalization, and outpatient psychiatric services.  He also denies illicit drug use.  Reports he drinks alcohol occasionally, and dips tobacco.  He reports he is eating with no problem. Today he denies suicidal/self-harm/homicidal ideation, psychosis, paranoia, and abnormal movement. AIMS, PHQ 2/9, C-SSRS, GAD 7, and AIMS screenings conducted during today's visit, see scores below.  Recommended the following: Start Prozac  taper:  Week 1:  Take 30 mg daily for 7 days.  Week 2:  Take 20 mg daily for 7 days.   Week 3:  Take 10 mg daily for 7 days then stop/discontinue.  Start Zoloft   12.5 mg daily for 7 days then increase to 25 mg daily.  Start BuSpar 5 mg daily.  Increase Seroquel  XR 100 mg nightly.  Informed would give Ativan  0.5 mg twice daily as needed for only a short period of time no greater than 30 days.  He is Informed of side effect/efficacy profile on Zoloft  and BuSpar.  He is also informed that usually takes a couple of weeks before notable improvements are seen.  He voices understanding with information being given to him today and is agreeable to recommendations.     Associated Signs/Symptoms: Depression Symptoms:  depressed mood, insomnia, anxiety, panic attacks, (Hypo) Manic Symptoms:  Irritable Mood, Anxiety  Symptoms:  Excessive Worry, Panic Symptoms, Psychotic Symptoms:  Denies PTSD Symptoms: NA  Past Psychiatric History: Anxiety, depression, and insomnia  Previous Psychotropic Medications: Yes   Substance Abuse History in the last 12 months:  No.  Consequences of Substance Abuse: NA  Past Medical History:  Past Medical History:  Diagnosis Date   Anxiety    Calcium kidney stones 2007   Depression    Hyperlipidemia    Hypertension    Insomnia    Olecranon fracture    left   Shingles 2006    Past Surgical History:  Procedure Laterality Date   FRACTURE SURGERY     I & D EXTREMITY Left 09/13/2015   Procedure: IRRIGATION AND DEBRIDEMENT EXTREMITY;  Surgeon: Wes Hamman, MD;  Location: [facility] OR;  Service: Orthopedics;  Laterality: Left;   ORIF ELBOW FRACTURE Left 10/30/2015   Procedure: OPEN REDUCTION INTERNAL FIXATION (ORIF) LEFT OLECRANON FRACTURE;  Surgeon: Wes Hamman, MD;  Location: [facility] OR;  Service: Orthopedics;  Laterality: Left;   ORIF HUMERUS FRACTURE Left 09/13/2015   Procedure: OPEN REDUCTION INTERNAL FIXATION (ORIF) ELBOW/OLECRANON AND I AND D;  Surgeon: Wes Hamman, MD;  Location: [facility] OR;  Service: Orthopedics;  Laterality: Left;   TONSILLECTOMY     VASECTOMY      Family Psychiatric History: See below and family history  Family History:  Family History  Problem Relation Age of Onset   Anxiety disorder Mother    Hypertension Mother    Anxiety disorder Father    Hypertension Father    Anxiety disorder Sister    Hypertension Sister    Anxiety disorder Maternal Grandfather    Anxiety disorder Maternal Grandmother    Heart disease Maternal Grandmother    CAD Maternal Grandmother        bypass surgery   Anxiety disorder Paternal Grandfather    Anxiety disorder Paternal Grandmother     Social History:   Social History   Socioeconomic History   Marital status: Married    Spouse name: Chanda   Number of children: 2   Years of education: 12   Highest  education level: 12th grade  Occupational History   Not on file  Tobacco Use   Smoking status: Former    Current packs/day: 0.00    Types: Cigarettes    Quit date: 05/23/2013    Years since quitting: 10.4   Smokeless tobacco: Current    Types: Snuff  Vaping Use   Vaping status: Never Used  Substance and Sexual Activity   Alcohol use: No    Alcohol/week: 0.0 standard drinks of alcohol   Drug use: No   Sexual activity: Yes    Birth control/protection: Surgical  Other Topics Concern   Not on file  Social History Narrative   Not on file   Social  Drivers of Corporate investment banker Strain: Not on file  Food Insecurity: Not on file  Transportation Needs: Not on file  Physical Activity: Unknown (03/11/2023)   Exercise Vital Sign    Days of Exercise per Week: Patient declined    Minutes of Exercise per Session: Not on file  Stress: Not on file  Social Connections: Unknown (01/29/2022)   Received from [facility]   Social Network    Social Network: Not on file    Allergies:   Allergies  Allergen Reactions   Hydrocodone  Other (See Comments)   Guaifenesin & Derivatives Palpitations   Oxycodone  Nausea And Vomiting    Metabolic Disorder Labs: Most recent labs reviewed No results found for: "HGBA1C", "MPG" No results found for: "PROLACTIN" Lab Results  Component Value Date   CHOL 207 (H) 07/19/2023   TRIG 210 (H) 07/19/2023   HDL 42 07/19/2023   CHOLHDL 4.9 07/19/2023   LDLCALC 128 (H) 07/19/2023   LDLCALC 162 (H) 03/13/2020   Lab Results  Component Value Date   TSH 2.550 07/19/2023     Current Medications: Current Outpatient Medications  Medication Sig Dispense Refill   albuterol  (VENTOLIN  HFA) 108 (90 Base) MCG/ACT inhaler Inhale 2 puffs into the lungs every 6 (six) hours as needed. 18 g 0   busPIRone (BUSPAR) 5 MG tablet Take 1 tablet (5 mg total) by mouth 2 (two) times daily. 60 tablet 0   esomeprazole  (NEXIUM ) 40 MG capsule Take 1 capsule  (40 mg total) by mouth daily. (NEEDS TO BE SEEN BEFORE NEXT REFILL) 90 capsule 0   FLUoxetine  (PROZAC ) 10 MG capsule Take 1 capsule (10 mg total) by mouth daily. 10 capsule 0   LORazepam  (ATIVAN ) 0.5 MG tablet Take 1 tablet (0.5 mg total) by mouth 2 (two) times daily as needed for anxiety. 30 tablet 0   losartan  (COZAAR ) 50 MG tablet Take 1 tablet (50 mg total) by mouth daily. 90 tablet 1   montelukast  (SINGULAIR ) 10 MG tablet Take 1 tablet (10 mg total) by mouth at bedtime. 90 tablet 3   QUEtiapine  (SEROQUEL  XR) 50 MG TB24 24 hr tablet Take 2 tablets (100 mg total) by mouth at bedtime. 60 tablet 1   sertraline  (ZOLOFT ) 25 MG tablet Take 12.5 mg (1/2 tablet) daily for 7 days then increase to 25 mg (one tablet) daily 30 tablet 1   zolpidem  (AMBIEN  CR) 12.5 MG CR tablet Take 1 tablet (12.5 mg total) by mouth at bedtime as needed for sleep. 30 tablet 5   FLUoxetine  (PROZAC ) 20 MG capsule Take 3 capsules (60 mg total) by mouth daily. Week 1:  Take 30 mg daily for 7 days.  Week 2:  Take 20 mg daily for 7 days.   Week 3:  Take 10 mg daily for 7 days then stop/discontinue 20 capsule 0   No current facility-administered medications for this visit.    Musculoskeletal: Strength & Muscle Tone: within normal limits Gait & Station: normal Patient leans: N/A  Psychiatric Specialty Exam: Review of Systems  Constitutional:        No other complaints voiced  Psychiatric/Behavioral:  Positive for dysphoric mood and sleep disturbance. Negative for agitation, hallucinations, self-injury and suicidal ideas. The patient is nervous/anxious.   All other systems reviewed and are negative.   Blood pressure 119/75, pulse 72, height 5\' 10"  (1.778 m), weight 176 lb 9.6 oz (80.1 kg), SpO2 98%.Body mass index is 25.34 kg/m.  General Appearance: Casual  Eye Contact:  Good  Speech:  Clear and Coherent and Normal Rate  Volume:  Normal  Mood:  Anxious  Affect:  Congruent  Thought Process:  Coherent, Goal Directed, and  Descriptions of Associations: Intact  Orientation:  Full (Time, Place, and Person)  Thought Content:  Logical  Suicidal Thoughts:  No  Homicidal Thoughts:  No  Memory:  Immediate;   Good Recent;   Good Remote;   Good  Judgement:  Intact  Insight:  Present  Psychomotor Activity:  Normal  Concentration:  Concentration: Good and Attention Span: Good  Recall:  Good  Fund of Knowledge:Good  Language: Good  Akathisia:  No  Handed:  Right  AIMS (if indicated):  done  Assets:  Communication Skills Desire for Improvement Financial Resources/Insurance Housing Intimacy Leisure Time Physical Health Resilience Social Support Transportation  ADL's:  Intact  Cognition: WNL  Sleep:  Poor   Screenings: AUDIT    Flowsheet Row Office Visit from 10/17/2023 in [facility] [place]  Alcohol Use Disorder Identification Test Final Score (AUDIT) 3      GAD-7    Flowsheet Row Office Visit from 10/17/2023 in [facility] Office Visit from 08/18/2023 in [facility] Office Visit from 07/19/2023 in [facility] Office Visit from 12/30/2022 in [facility] Office Visit from 08/09/2019 in [facility]  Total GAD-7 Score 20 15 19  0 7      PHQ2-9    Flowsheet Row Office Visit from 10/17/2023 in [facility] [place] Office Visit from 08/18/2023 in [facility] Office Visit from 07/19/2023 in [facility] Office Visit from 12/30/2022 in [facility] Office Visit from 03/14/2020 in [facility]  PHQ-2 Total Score 1 1 2  0 0  PHQ-9 Total Score 13 9 13  0 --      Flowsheet Row Office Visit from 10/17/2023 in [facility] [place]  C-SSRS RISK CATEGORY No Risk       Assessment and Plan:  Assessment: Patient seen and examined as noted above. Summary: Today NAITIK HERMANN appears to be doing fairly well.  He reports current medication regimen is not effectively managing his mental health.  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  He denies suicidal/self-harm/homicidal ideation, psychosis, paranoia, and abnormal movements   During visit he is dressed appropriate for age and weather.  He is seated comfortably in chair with no noted distress.  He is alert/oriented x 4, calm/cooperative and mood is congruent with affect.  He spoke in a clear tone at moderate volume, and normal pace, with good eye contact.  His thought process is coherent, relevant, and there is no indication that he is currently responding to internal/external stimuli or experiencing delusional thought content.    1. Insomnia, unspecified type (Primary) - QUEtiapine  (SEROQUEL  XR) 50 MG TB24 24 hr tablet; Take 2 tablets (100 mg total) by mouth at bedtime.  Dispense: 60 tablet; Refill: 1 - busPIRone (BUSPAR) 5 MG tablet; Take 1 tablet (5 mg total) by mouth 2 (two) times daily.  Dispense: 60 tablet; Refill: 0 - LORazepam  (ATIVAN ) 0.5 MG tablet; Take 1 tablet (0.5  mg total) by mouth 2 (two) times daily as needed for anxiety.  Dispense: 30 tablet; Refill: 0  2. Moderately severe recurrent major depression (HCC) - QUEtiapine  (SEROQUEL  XR) 50 MG TB24 24 hr tablet; Take 2 tablets (100 mg total) by mouth at bedtime.  Dispense: 60 tablet; Refill: 1 - FLUoxetine  (PROZAC ) 20 MG capsule; Take 3 capsules (60 mg total) by mouth daily. Week 1:  Take 30 mg daily for 7 days.  Week 2:  Take 20 mg daily for 7 days.   Week 3:  Take 10 mg daily for 7 days then stop/discontinue  Dispense: 20 capsule;  Refill: 0 - sertraline  (ZOLOFT ) 25 MG tablet; Take 12.5 mg (1/2 tablet) daily for 7 days then increase to 25 mg (one tablet) daily  Dispense: 30 tablet; Refill: 1 - FLUoxetine  (PROZAC ) 10 MG capsule; Take 1 capsule (10 mg total) by mouth daily.  Dispense: 10 capsule; Refill: 0  3. Generalized anxiety disorder with panic attacks - QUEtiapine  (SEROQUEL  XR) 50 MG TB24 24 hr tablet; Take 2 tablets (100 mg total) by mouth at bedtime.  Dispense: 60 tablet; Refill: 1 - FLUoxetine  (PROZAC ) 20 MG capsule; Take 3 capsules (60 mg total) by mouth daily. Week 1:  Take 30 mg daily for 7 days.  Week 2:  Take 20 mg daily for 7 days.   Week 3:  Take 10 mg daily for 7 days then stop/discontinue  Dispense: 20 capsule; Refill: 0 - sertraline  (ZOLOFT ) 25 MG tablet; Take 12.5 mg (1/2 tablet) daily for 7 days then increase to 25 mg (one tablet) daily  Dispense: 30 tablet; Refill: 1 - FLUoxetine  (PROZAC ) 10 MG capsule; Take 1 capsule (10 mg total) by mouth daily.  Dispense: 10 capsule; Refill: 0    Plan: Medications: Meds ordered this encounter  Medications   QUEtiapine  (SEROQUEL  XR) 50 MG TB24 24 hr tablet    Sig: Take 2 tablets (100 mg total) by mouth at bedtime.    Dispense:  60 tablet    Refill:  1    Supervising Provider:   ARFEEN, SYED T [[id number]]   FLUoxetine  (PROZAC ) 20 MG capsule    Sig: Take 3 capsules (60 mg total) by mouth daily. Week 1:  Take 30 mg daily for 7 days.  Week 2:  Take 20 mg daily for 7 days.   Week 3:  Take 10 mg daily for 7 days then stop/discontinue    Dispense:  20 capsule    Refill:  0    Supervising Provider:   ARFEEN, SYED T [[id number]]   sertraline  (ZOLOFT ) 25 MG tablet    Sig: Take 12.5 mg (1/2 tablet) daily for 7 days then increase to 25 mg (one tablet) daily    Dispense:  30 tablet    Refill:  1    Supervising Provider:   ARFEEN, SYED T [[id number]]   FLUoxetine  (PROZAC ) 10 MG capsule    Sig: Take 1 capsule (10 mg total) by mouth daily.    Dispense:  10 capsule    Refill:  0     Supervising Provider:   Carlos Chesterfield, SYED T [[id number]]   busPIRone (BUSPAR) 5 MG tablet    Sig: Take 1 tablet (5 mg total) by mouth 2 (two) times daily.    Dispense:  60 tablet    Refill:  0    Supervising Provider:   ARFEEN, SYED T [[id number]]   LORazepam  (ATIVAN ) 0.5 MG tablet    Sig: Take 1 tablet (0.5 mg total) by  mouth 2 (two) times daily as needed for anxiety.    Dispense:  30 tablet    Refill:  0    Supervising Provider:   Eduard Grad T [[id number]]    Labs:  Not indicated at this time; most recent labs reviewed  Other:  Referral to counseling/therapy.   SENON NIXON is instructed to call 911, [phone], mobile crisis, or present to the nearest emergency room should he experience any suicidal/homicidal ideation, auditory/visual/hallucinations, or detrimental worsening of his mental health condition.   Krishon also advised to reduce tobacco use and to consider quitting MATTHIEU LOFTUS has participated in the development of this treatment plan and verbalized his agreement with plan as listed.  Follow Up: Return in 2 weeks for medication management Call in the interim for any side-effects, decompensation, questions, or problems  Collaboration of Care: Medication Management AEB medication assessment, adjustment, and started BuSpar and Prozac  and Referral or follow-up with counselor/therapist AEB referral to counseling/therapy  Patient/Guardian was advised Release of Information must be obtained prior to any record release in order to collaborate their care with an outside provider. Patient/Guardian was advised if they have not already done so to contact the registration department to sign all necessary forms in order for us  to release information regarding their care.   Consent: Patient/Guardian gives verbal consent for treatment and assignment of benefits for services provided during this visit. Patient/Guardian expressed understanding and agreed to proceed.   Amel Gianino, NP 6/2/20259:23 AM

## 2023-10-17 NOTE — Patient Instructions (Addendum)
 You are currently taking 60 mg daily:  Week 1:  Take 30 mg daily for 7 days.  Week 2:  Take 20 mg daily for 7 days.   Week 3:  Take 10 mg daily for 7 days then stop/discontinue.   You can start Zoloft  during taper of the Prozac   Zoloft :  Take 12.5 mg (1/2 tablet) daily for 7 days then increase to 25 mg (one tablet) daily  Antidepressant discontinuation syndrome:  One of the symptoms that can occur are brain zaps which are described as electrical shock sensations or electrical current zapping brain, which can be very uncomfortable and frighting to someone.    Antidepressant withdrawal: During withdrawal from antidepressant medications, "brain zaps" are considered common symptoms to experience. It is believed that the severity and length of brain zaps may be related to whether a person discontinues "cold [place]" as opposed to tapering off of their medication.  How to stop Brain Zaps There are no known medical treatments to stop the brain zaps. Most often individuals may have to them with the understanding that in time they will eventually subside. Below are some recommendations that may help deal with the zaps.  Slower tapper:  If quit medication cold [place] may need to start taking again, and then conduct a slower/gradual taper off.  Often zaps are caused when an individual quits medication too quickly and/or from to high of a dose.    Go back on medication:  Some individuals choose to go back on medications.  You can then decide if you wish to taper off or switch to a different medication.  You provider may also choose to add an adjunct to current medications.    Prozac :  has a longer half-life.  Another way to minimize brain zaps is to transition to another drug that has a longer half-life.  Your provider could start Prozac  during the taper off of your current antidepressant, and then later stop.  Withdrawing from the Prozac  should reduce chances of zaps.   Vitamins and Supplements:  Various  supplements have been reported to reduce the severity of the brain zaps.  However, it has not been verified that these supplements actually work to stop/reduce the zaps. Vitamins and Omega-3 fatty acids:  Individuals have reported that there is a significant improvement with Omega-3 fatty acids in the form of "fish oil" Vitamin B12 It has been suggested that getting vitamins help to minimize the zaps.   A combination of Vitamin B12 and Omega-3 fatty acid has been reported to decrease the severity and frequency of zaps in some individuals.  ADHD Online Testing         Address:  [address]?[place], [place] [address] Phone: [phone] Fax: [phone] Email:  [email or web address]  ADHD Care in [place], [place] [facility], we are dedicated to providing comprehensive ADHD treatment and management for patients of all ages. Our range of services is designed to address the unique needs of each individual, ensuring personalized and effective care.  ADHD Evaluation and Diagnosis Accurate diagnosis is the first step towards effective treatment. Our thorough evaluation process includes:   QbTest to objectively measure ADHD symptoms  Standardized ADHD rating scales  Behavioral assessments  Medical history review  Individualized Treatment Plans We believe in personalized care. Based on your evaluation, we develop a tailored treatment plan that may include:   Medication management  Lifestyle and dietary recommendations  Parent and family education  Social skills training  Organizational  and time management strategies    Call 911, [phone], mobile crisis, or present to the nearest emergency room should you experience any suicidal/homicidal ideation, auditory/visual/hallucinations, or detrimental worsening of your mental health.  Mobile Crisis Response Teams Listed by counties in vicinity of [facility] providers [facility] [phone] [facility] [phone] [facility] [phone] [facility] [phone] [place]                * [facility] [phone]                * [facility] [phone]  [facility] [phone] [facility] [employer]  [phone] * [facility] [phone]

## 2023-10-17 NOTE — Addendum Note (Signed)
 Addended by: Syncere Kaminski B on: 10/17/2023 12:06 PM   Modules accepted: Orders

## 2023-10-24 ENCOUNTER — Encounter

## 2023-11-02 ENCOUNTER — Other Ambulatory Visit: Payer: Self-pay | Admitting: Family Medicine

## 2023-11-02 DIAGNOSIS — F5105 Insomnia due to other mental disorder: Secondary | ICD-10-CM

## 2023-11-02 NOTE — Telephone Encounter (Signed)
 Copied from CRM [id number]. Topic: Clinical - Medication Refill >> Nov 02, 2023  9:46 AM Bearl Botts E wrote: Medication:   zolpidem  (AMBIEN  CR) 12.5 MG CR tablet  Has the patient contacted their pharmacy? Yes (Agent: If no, request that the patient contact the pharmacy for the refill. If patient does not wish to contact the pharmacy document the reason why and proceed with request.) (Agent: If yes, when and what did the pharmacy advise?)  This is the patient's preferred pharmacy:  [facility] [place], [place] - [address] [place] [address] Phone: [phone] Fax: [phone]  Is this the correct pharmacy for this prescription? Yes If no, delete pharmacy and type the correct one.   Has the prescription been filled recently? Yes  Is the patient out of the medication? Yes  Has the patient been seen for an appointment in the last year OR does the patient have an upcoming appointment? Yes  Can we respond through MyChart? Yes  Agent: Please be advised that Rx refills may take up to 3 business days. We ask that you follow-up with your pharmacy.

## 2023-11-02 NOTE — Telephone Encounter (Signed)
 Explained to pt that the requested medication is a controlled med that can only be filled during a visit and that I could make him an appt w/ his PCP on 11/17/23, He stated that he just saw her, I said that I understand that but since this is a controlled medication it can only be filled during a visit and he said not to worry about it.

## 2023-11-03 ENCOUNTER — Telehealth (HOSPITAL_COMMUNITY): Payer: Self-pay

## 2023-11-03 NOTE — Telephone Encounter (Signed)
 Spoke with pt advised of Shuvon's message he verbalized understanding

## 2023-11-03 NOTE — Telephone Encounter (Signed)
 Pt called in wanting a refill on his zolpidem  (AMBIEN  CR) 12.5 MG CR tablet  sent to [facility] pharmacy pt scheduled 11/07/23. Please advise

## 2023-11-07 ENCOUNTER — Encounter (HOSPITAL_COMMUNITY): Payer: Self-pay | Admitting: Registered Nurse

## 2023-11-07 ENCOUNTER — Telehealth (INDEPENDENT_AMBULATORY_CARE_PROVIDER_SITE_OTHER): Admitting: Registered Nurse

## 2023-11-07 DIAGNOSIS — F332 Major depressive disorder, recurrent severe without psychotic features: Secondary | ICD-10-CM | POA: Diagnosis not present

## 2023-11-07 DIAGNOSIS — F410 Panic disorder [episodic paroxysmal anxiety] without agoraphobia: Secondary | ICD-10-CM

## 2023-11-07 DIAGNOSIS — F411 Generalized anxiety disorder: Secondary | ICD-10-CM | POA: Diagnosis not present

## 2023-11-07 DIAGNOSIS — G4700 Insomnia, unspecified: Secondary | ICD-10-CM

## 2023-11-07 MED ORDER — ZOLPIDEM TARTRATE ER 12.5 MG PO TBCR: 12.5000 mg | EXTENDED_RELEASE_TABLET | Freq: Every evening | ORAL | 3 refills | Status: DC | PRN

## 2023-11-07 MED ORDER — BUSPIRONE HCL 5 MG PO TABS
5.0000 mg | ORAL_TABLET | Freq: Two times a day (BID) | ORAL | 3 refills | Status: DC
Start: 2023-11-07 — End: 2024-03-22

## 2023-11-07 MED ORDER — QUETIAPINE FUMARATE ER 50 MG PO TB24: 100.0000 mg | ORAL_TABLET | Freq: Every day | ORAL | 3 refills | Status: DC

## 2023-11-07 MED ORDER — LORAZEPAM 0.5 MG PO TABS: ORAL_TABLET | ORAL | 0 refills | Status: DC

## 2023-11-07 MED ORDER — SERTRALINE HCL 25 MG PO TABS: 25.0000 mg | ORAL_TABLET | Freq: Every day | ORAL | 3 refills | Status: DC

## 2023-11-07 NOTE — Progress Notes (Signed)
 [facility] MD/PA/NP OP Progress Note  11/07/2023 9:26 PM Jacob Benson  MRN:  [id number]  Virtual Visit via Video Note  I connected with [name] on 11/07/23 at  4:00 PM EDT by a video enabled telemedicine application and verified that I am speaking with the correct person using two identifiers.  Location: Patient: Work Provider: [facility], [place]   I discussed the limitations of evaluation and management by telemedicine and the availability of in person appointments. The patient expressed understanding and agreed to proceed.  I discussed the assessment and treatment plan with the patient. The patient was provided an opportunity to ask questions and all were answered. The patient agreed with the plan and demonstrated an understanding of the instructions.   The patient was advised to call back or seek an in-person evaluation if the symptoms worsen or if the condition fails to improve as anticipated.  I provided 40 minutes of non-face-to-face time during this encounter.   Luisa Ruder, NP   Chief Complaint:  Chief Complaint  Patient presents with   Follow-up    Medication management   HPI: Jacob Benson 38 y.o. male presents today for medication management follow up.  He is seen via virtual video visit by this provider, and chart reviewed on 11/07/23.  His psychiatric history is significant for .general anxiety disorder, panic attacks, major depressive disorder, insomnia.  His mental health is currently managed with Seroquel  200 mg nightly, Ativan  0.5 mg twice daily as needed, Ambien  12.5 mg nightly as needed, and BuSpar  5 mg twice daily.  He reports current medication are effectively managing his mental health without any adverse reactions.  He reports his PCP was prescribing Ambien  but e was informed that psychiatry would prescribe all controlled medications.  He reports that while taking Ambien  and Seroquel  he was sleeping without any difficulty.  Reports ran out of  Ambien  last week and had trouble sleeping since ran out of Ambien .  Discussed discontinuing Ativan  and information for taper would be added to after visit summary.  He reports there has been improvement in panic attack, anxiety, depression.  Reports he is eating without any difficulty.  Today he denies suicidal/self-harm/homicidal ideation, psychosis, paranoia, and abnormal movements  Recommended the following: Continue Seroquel  200 mg nightly, BuSpar  5 mg twice daily, Ambien  12.5 mg nightly as needed.  He voices understanding/ agreement with information/recommendations being given to him today.    Visit Diagnosis:    ICD-10-CM   1. Moderately severe recurrent major depression (HCC)  F33.2 sertraline  (ZOLOFT ) 25 MG tablet    QUEtiapine  (SEROQUEL  XR) 50 MG TB24 24 hr tablet    2. Generalized anxiety disorder with panic attacks  F41.1 sertraline  (ZOLOFT ) 25 MG tablet   F41.0 busPIRone  (BUSPAR ) 5 MG tablet    QUEtiapine  (SEROQUEL  XR) 50 MG TB24 24 hr tablet    3. Insomnia, unspecified type  G47.00 busPIRone  (BUSPAR ) 5 MG tablet    LORazepam  (ATIVAN ) 0.5 MG tablet    QUEtiapine  (SEROQUEL  XR) 50 MG TB24 24 hr tablet    zolpidem  (AMBIEN  CR) 12.5 MG CR tablet      Past Psychiatric History: general anxiety disorder, panic attacks, and major depressive disorder   Past Medical History:  Past Medical History:  Diagnosis Date   Anxiety    Calcium kidney stones 2007   Depression    Hyperlipidemia    Hypertension    Insomnia    Olecranon fracture    left   Shingles 2006  Past Surgical History:  Procedure Laterality Date   FRACTURE SURGERY     I & D EXTREMITY Left 09/13/2015   Procedure: IRRIGATION AND DEBRIDEMENT EXTREMITY;  Surgeon: Kay CHRISTELLA Cummins, MD;  Location: [facility] OR;  Service: Orthopedics;  Laterality: Left;   ORIF ELBOW FRACTURE Left 10/30/2015   Procedure: OPEN REDUCTION INTERNAL FIXATION (ORIF) LEFT OLECRANON FRACTURE;  Surgeon: Kay CHRISTELLA Cummins, MD;  Location: [facility] OR;  Service:  Orthopedics;  Laterality: Left;   ORIF HUMERUS FRACTURE Left 09/13/2015   Procedure: OPEN REDUCTION INTERNAL FIXATION (ORIF) ELBOW/OLECRANON AND I AND D;  Surgeon: Kay CHRISTELLA Cummins, MD;  Location: [facility] OR;  Service: Orthopedics;  Laterality: Left;   TONSILLECTOMY     VASECTOMY      Family Psychiatric History: See below and family history  Family History:  Family History  Problem Relation Age of Onset   Anxiety disorder Mother    Hypertension Mother    Anxiety disorder Father    Hypertension Father    Anxiety disorder Sister    Hypertension Sister    Anxiety disorder Maternal Grandfather    Anxiety disorder Maternal Grandmother    Heart disease Maternal Grandmother    CAD Maternal Grandmother        bypass surgery   Anxiety disorder Paternal Grandfather    Anxiety disorder Paternal Grandmother     Social History:  Social History   Socioeconomic History   Marital status: Married    Spouse name: Chanda   Number of children: 2   Years of education: 12   Highest education level: 12th grade  Occupational History   Not on file  Tobacco Use   Smoking status: Former    Current packs/day: 0.00    Types: Cigarettes    Quit date: 05/23/2013    Years since quitting: 10.4   Smokeless tobacco: Current    Types: Snuff  Vaping Use   Vaping status: Never Used  Substance and Sexual Activity   Alcohol use: No    Alcohol/week: 0.0 standard drinks of alcohol   Drug use: No   Sexual activity: Yes    Birth control/protection: Surgical  Other Topics Concern   Not on file  Social History Narrative   Not on file   Social Drivers of Health   Financial Resource Strain: Not on file  Food Insecurity: Not on file  Transportation Needs: Not on file  Physical Activity: Unknown (03/11/2023)   Exercise Vital Sign    Days of Exercise per Week: Patient declined    Minutes of Exercise per Session: Not on file  Stress: Not on file  Social Connections: Unknown (01/29/2022)   Received from [employer]   Social Network    Social Network: Not on file    Allergies:  Allergies  Allergen Reactions   Hydrocodone  Other (See Comments)   Guaifenesin & Derivatives Palpitations   Oxycodone  Nausea And Vomiting    Metabolic Disorder Labs: Reviewed No results found for: HGBA1C, MPG No results found for: PROLACTIN Lab Results  Component Value Date   CHOL 207 (H) 07/19/2023   TRIG 210 (H) 07/19/2023   HDL 42 07/19/2023   CHOLHDL 4.9 07/19/2023   LDLCALC 128 (H) 07/19/2023   LDLCALC 162 (H) 03/13/2020   Lab Results  Component Value Date   TSH 2.550 07/19/2023   TSH 2.470 06/15/2018    Current Medications: Current Outpatient Medications  Medication Sig Dispense Refill   albuterol  (VENTOLIN  HFA) 108 (90 Base) MCG/ACT inhaler Inhale 2 puffs into  the lungs every 6 (six) hours as needed. 18 g 0   busPIRone  (BUSPAR ) 5 MG tablet Take 1 tablet (5 mg total) by mouth 2 (two) times daily. 60 tablet 3   esomeprazole  (NEXIUM ) 40 MG capsule Take 1 capsule (40 mg total) by mouth daily. (NEEDS TO BE SEEN BEFORE NEXT REFILL) 90 capsule 0   LORazepam  (ATIVAN ) 0.5 MG tablet Ativan  Taper:  Week 1: Take 0.25 (1/2 tablet) daily as needed for one week.  Week 2:  Take 0.25 mg (1/2 tablet) every other day stop/discontinue 15 tablet 0   losartan  (COZAAR ) 50 MG tablet Take 1 tablet (50 mg total) by mouth daily. 90 tablet 1   montelukast  (SINGULAIR ) 10 MG tablet Take 1 tablet (10 mg total) by mouth at bedtime. 90 tablet 3   QUEtiapine  (SEROQUEL  XR) 50 MG TB24 24 hr tablet Take 2 tablets (100 mg total) by mouth at bedtime. 60 tablet 3   sertraline  (ZOLOFT ) 25 MG tablet Take 1 tablet (25 mg total) by mouth daily. Take 12.5 mg (1/2 tablet) daily for 7 days then increase to 25 mg (one tablet) daily 30 tablet 3   zolpidem  (AMBIEN  CR) 12.5 MG CR tablet Take 1 tablet (12.5 mg total) by mouth at bedtime as needed for sleep. 30 tablet 3   No current facility-administered medications for this visit.     Musculoskeletal: Strength & Muscle Tone: Unable to assess via virtual visit Gait & Station: Unable to assess via virtual visit Patient leans: N/A  Psychiatric Specialty Exam: Review of Systems  Constitutional:        No other complaints voiced at this time  Psychiatric/Behavioral:  Positive for dysphoric mood (Stable) and sleep disturbance. Negative for agitation, hallucinations, self-injury and suicidal ideas. The patient is nervous/anxious (Stable).   All other systems reviewed and are negative.   There were no vitals taken for this visit.There is no height or weight on file to calculate BMI.  General Appearance: Casual  Eye Contact:  Good  Speech:  Clear and Coherent and Normal Rate  Volume:  Normal  Mood:  Euthymic  Affect:  Congruent  Thought Process:  Coherent, Goal Directed, and Descriptions of Associations: Intact  Orientation:  Full (Time, Place, and Person)  Thought Content: Logical   Suicidal Thoughts:  No  Homicidal Thoughts:  No  Memory:  Immediate;   Good Recent;   Good Remote;   Good  Judgement:  Intact  Insight:  Good  Psychomotor Activity:  Normal  Concentration:  Concentration: Good and Attention Span: Good  Recall:  Good  Fund of Knowledge: Good  Language: Good  Akathisia:  No  Handed:  Right  AIMS (if indicated): not done  Assets:  Communication Skills Desire for Improvement Financial Resources/Insurance Housing Intimacy Physical Health Social Support Transportation  ADL's:  Intact  Cognition: WNL  Sleep:  Good With medication   Screenings: AUDIT    Flowsheet Row Office Visit from 10/17/2023 in [facility] [place]  Alcohol Use Disorder Identification Test Final Score (AUDIT) 3   GAD-7    Flowsheet Row Office Visit from 10/17/2023 in [facility] [place] Office Visit from 08/18/2023 in [facility] Office Visit from 07/19/2023 in [facility] Office Visit from 12/30/2022 in [facility] Office Visit from 08/09/2019 in [facility]  Total GAD-7 Score 20 15 19  0 7   PHQ2-9  Flowsheet Row Office Visit from 10/17/2023 in [facility] [place] Office Visit from 08/18/2023 in [facility] Office Visit from 07/19/2023 in [facility] Office Visit from 12/30/2022 in [facility] Office Visit from 03/14/2020 in [facility]  PHQ-2 Total Score 1 1 2  0 0  PHQ-9 Total Score 13 9 13  0 --   Flowsheet Row Office Visit from 10/17/2023 in [facility] [place]  C-SSRS RISK CATEGORY No Risk     Assessment and Plan:  Assessment: Patient seen and examined as noted above. Summary: Today Jacob Benson appears to be doing well.  He reports current medication regimen is effectively managing his mental health without any adverse reaction.  Reports ran out of Ambien  1 week ago related to PCP informed him that psychiatry would be refilling if needed.  Reported while taking Seroquel  and Ambien  slept without any difficulty but for the last week has been having trouble sleeping/waking during the night.  Reports he is eating without any difficulty He denies suicidal/self-harm/homicidal ideations, psychosis, paranoia, abnormal movements. During visit he is dressed appropriate for age and weather.  He is seated in view of camera with no noted distress.  He is alert/oriented x 4, calm/cooperative and mood is congruent with affect.  He spoke in a clear tone at moderate volume, and normal pace, with good eye contact.  His thought process is coherent, relevant, and there is no indication that he is currently responding to internal/external stimuli or experiencing delusional  thought content.    1. Moderately severe recurrent major depression (HCC) - sertraline  (ZOLOFT ) 25 MG tablet; Take 1 tablet (25 mg total) by mouth daily. Take 12.5 mg (1/2 tablet) daily for 7 days then increase to 25 mg (one tablet) daily  Dispense: 30 tablet; Refill: 3 - QUEtiapine  (SEROQUEL  XR) 50 MG TB24 24 hr tablet; Take 2 tablets (100 mg total) by mouth at bedtime.  Dispense: 60 tablet; Refill: 3  2. Generalized anxiety disorder with panic attacks - sertraline  (ZOLOFT ) 25 MG tablet; Take 1 tablet (25 mg total) by mouth daily. Take 12.5 mg (1/2 tablet) daily for 7 days then increase to 25 mg (one tablet) daily  Dispense: 30 tablet; Refill: 3 - busPIRone  (BUSPAR ) 5 MG tablet; Take 1 tablet (5 mg total) by mouth 2 (two) times daily.  Dispense: 60 tablet; Refill: 3 - QUEtiapine  (SEROQUEL  XR) 50 MG TB24 24 hr tablet; Take 2 tablets (100 mg total) by mouth at bedtime.  Dispense: 60 tablet; Refill: 3  3. Insomnia, unspecified type - busPIRone  (BUSPAR ) 5 MG tablet; Take 1 tablet (5 mg total) by mouth 2 (two) times daily.  Dispense: 60 tablet; Refill: 3 - LORazepam  (ATIVAN ) 0.5 MG tablet; Ativan  Taper:  Week 1: Take 0.25 (1/2 tablet) daily as needed for one week.  Week 2:  Take 0.25 mg (1/2 tablet) every other day stop/discontinue  Dispense: 15 tablet; Refill: 0 - QUEtiapine  (SEROQUEL  XR) 50 MG TB24 24 hr tablet; Take 2 tablets (100 mg total) by mouth at bedtime.  Dispense: 60 tablet; Refill: 3 - zolpidem  (AMBIEN  CR) 12.5 MG CR tablet; Take 1 tablet (12.5 mg total) by mouth at bedtime as needed for sleep.  Dispense: 30 tablet; Refill: 3   Plan: Medications: Meds ordered this encounter  Medications   sertraline  (ZOLOFT ) 25 MG tablet    Sig: Take 1 tablet (25 mg total) by  mouth daily. Take 12.5 mg (1/2 tablet) daily for 7 days then increase to 25 mg (one tablet) daily    Dispense:  30 tablet    Refill:  3    Supervising Provider:   ARFEEN, SYED T [[id number]]   busPIRone  (BUSPAR ) 5 MG tablet    Sig:  Take 1 tablet (5 mg total) by mouth 2 (two) times daily.    Dispense:  60 tablet    Refill:  3    Supervising Provider:   ARFEEN, SYED T [[id number]]   LORazepam  (ATIVAN ) 0.5 MG tablet    Sig: Ativan  Taper:  Week 1: Take 0.25 (1/2 tablet) daily as needed for one week.  Week 2:  Take 0.25 mg (1/2 tablet) every other day stop/discontinue    Dispense:  15 tablet    Refill:  0    Supervising Provider:   CURRY PATERSON T [[id number]]   QUEtiapine  (SEROQUEL  XR) 50 MG TB24 24 hr tablet    Sig: Take 2 tablets (100 mg total) by mouth at bedtime.    Dispense:  60 tablet    Refill:  3    Supervising Provider:   ARFEEN, SYED T [[id number]]   zolpidem  (AMBIEN  CR) 12.5 MG CR tablet    Sig: Take 1 tablet (12.5 mg total) by mouth at bedtime as needed for sleep.    Dispense:  30 tablet    Refill:  3    Supervising Provider:   CURRY PATERSON T [[id number]]    Labs:  Not indicated at this time Lab Orders  No laboratory test(s) ordered today    Other:  Keep scheduled appointment with Winton Rubinstein, LCSW on 01/17/2024 at 9 AM for counseling/therapy.   SEATON HOFMANN is instructed to call [phone], mobile crisis, or present to the nearest emergency room should he experience any suicidal/homicidal ideation, auditory/visual/hallucinations, or detrimental worsening of his mental health condition.   [name] participated in the development of this treatment plan and verbalized his understanding/agreement with plan as listed.  Follow Up: Return in 3 months Call in the interim for any side-effects, decompensation, questions, or problems  Collaboration of Care: Collaboration of Care: Medication Management AEB medication assessment, adjustment, refills  Patient/Guardian was advised Release of Information must be obtained prior to any record release in order to collaborate their care with an outside provider. Patient/Guardian was advised if they have not already done so to contact the registration department to sign all necessary  forms in order for us  to release information regarding their care.   Consent: Patient/Guardian gives verbal consent for treatment and assignment of benefits for services provided during this visit. Patient/Guardian expressed understanding and agreed to proceed.    Malachi Kinzler, NP 11/07/2023, 9:26 PM

## 2023-11-07 NOTE — Patient Instructions (Signed)

## 2023-11-10 ENCOUNTER — Ambulatory Visit (HOSPITAL_COMMUNITY)
Admission: RE | Admit: 2023-11-10 | Discharge: 2023-11-10 | Disposition: A | Discharge status: Home / Self Care | Source: Ambulatory Visit | Attending: Family Medicine | Admitting: Family Medicine

## 2023-11-10 DIAGNOSIS — N289 Disorder of kidney and ureter, unspecified: Secondary | ICD-10-CM

## 2023-11-10 DIAGNOSIS — I10 Essential (primary) hypertension: Secondary | ICD-10-CM

## 2023-11-11 ENCOUNTER — Ambulatory Visit: Payer: Self-pay | Admitting: Family Medicine

## 2024-01-03 ENCOUNTER — Other Ambulatory Visit: Payer: Self-pay | Admitting: Family Medicine

## 2024-01-03 DIAGNOSIS — I10 Essential (primary) hypertension: Secondary | ICD-10-CM

## 2024-01-17 ENCOUNTER — Ambulatory Visit (HOSPITAL_COMMUNITY): Admitting: Psychiatry

## 2024-01-17 ENCOUNTER — Other Ambulatory Visit: Payer: Self-pay | Admitting: Family Medicine

## 2024-01-17 DIAGNOSIS — I10 Essential (primary) hypertension: Secondary | ICD-10-CM

## 2024-02-01 ENCOUNTER — Other Ambulatory Visit: Payer: Self-pay | Admitting: Family Medicine

## 2024-02-01 DIAGNOSIS — J301 Allergic rhinitis due to pollen: Secondary | ICD-10-CM

## 2024-02-15 ENCOUNTER — Ambulatory Visit: Admitting: Family Medicine

## 2024-02-15 VITALS — BP 117/74 | HR 75 | Temp 97.4°F | Ht 70.0 in | Wt 195.8 lb

## 2024-02-15 DIAGNOSIS — F411 Generalized anxiety disorder: Secondary | ICD-10-CM

## 2024-02-15 DIAGNOSIS — F5105 Insomnia due to other mental disorder: Secondary | ICD-10-CM

## 2024-02-15 DIAGNOSIS — K219 Gastro-esophageal reflux disease without esophagitis: Secondary | ICD-10-CM

## 2024-02-15 DIAGNOSIS — J301 Allergic rhinitis due to pollen: Secondary | ICD-10-CM

## 2024-02-15 DIAGNOSIS — I10 Essential (primary) hypertension: Secondary | ICD-10-CM

## 2024-02-15 DIAGNOSIS — E782 Mixed hyperlipidemia: Secondary | ICD-10-CM

## 2024-02-15 DIAGNOSIS — F321 Major depressive disorder, single episode, moderate: Secondary | ICD-10-CM | POA: Diagnosis not present

## 2024-02-15 DIAGNOSIS — F409 Phobic anxiety disorder, unspecified: Secondary | ICD-10-CM

## 2024-02-15 LAB — LIPID PANEL

## 2024-02-15 MED ORDER — MONTELUKAST SODIUM 10 MG PO TABS: 10.0000 mg | ORAL_TABLET | Freq: Every day | ORAL | 1 refills | Status: AC

## 2024-02-15 MED ORDER — ESOMEPRAZOLE MAGNESIUM 40 MG PO CPDR: 40.0000 mg | DELAYED_RELEASE_CAPSULE | Freq: Every day | ORAL | 1 refills | Status: AC

## 2024-02-15 MED ORDER — LOSARTAN POTASSIUM 50 MG PO TABS: 50.0000 mg | ORAL_TABLET | Freq: Every day | ORAL | 0 refills | Status: AC

## 2024-02-15 NOTE — Progress Notes (Signed)
 Subjective:  Patient ID: Jacob Benson, male    DOB: 1986/04/23, 38 y.o.   MRN: [id number]  Patient Care Team: Jacob Rock CHRISTELLA, FNP as PCP - General (Family Medicine) Jacob Benson, Rock CHRISTELLA, FNP as Nurse Practitioner (Family Medicine)   Chief Complaint:  Medical Management of Chronic Issues (6 month check ip)   HPI: Jacob Benson is a 38 y.o. male presenting on 02/15/2024 for Medical Management of Chronic Issues (6 month check ip)   Jacob Benson is a 38 year old male with anxiety and hypertension who presents for a follow-up visit.  He has recently adjusted his medication regimen for anxiety, now taking sertraline , extended-release Seroquel , and BuSpar . He reports feeling better overall, with improved sleep due to the extended-release Seroquel , although he still experiences occasional anxiety. He continues to use Ambien  and notes that while he still wakes up a couple of times at night, he can return to sleep more easily.  He reports that his reflux is good and he continues to take losartan  for hypertension. No headaches, chest pain, leg swelling, or shortness of breath are reported. He also takes Singulair  at night without issues and reports no postnasal drip, cough, or significant wheezing, though he occasionally experiences allergy-related wheezing.  He mentions a previous concern with kidney function due to elevated creatinine levels, which led to a specialist referral. He reports that the specialist performed an ultrasound and told him that he did not see anything concerning.  He has gained weight, attributing it to decreased activity at work and eating before sleep. He has started going back to the gym, attending three times a week with a former Systems analyst and doing additional workouts on his own. He has reduced soda consumption, primarily drinking water, Gatorade, and juice, but has recently had a few sodas. He notes a craving for sweets after reducing soda intake.  No  changes in hearing or vision, but he acknowledges the need for a vision check due to his history of high blood pressure. He wears glasses occasionally, primarily at night.           02/15/2024    8:29 AM 10/17/2023    8:06 AM 08/18/2023    8:06 AM 07/19/2023   12:10 PM 12/30/2022    4:22 PM  Depression screen PHQ 2/9  Decreased Interest 1  1 2  0  Down, Depressed, Hopeless 0  0 0 0  PHQ - 2 Score 1  1 2  0  Altered sleeping 2  3 3  0  Tired, decreased energy 2  0 3 0  Change in appetite 1  1 2  0  Feeling bad or failure about yourself  0  1 0 0  Trouble concentrating 2  1 3  0  Moving slowly or fidgety/restless 1  2 0 0  Suicidal thoughts 0  0 0 0  PHQ-9 Score 9  9 13  0  Difficult doing work/chores Somewhat difficult  Somewhat difficult Somewhat difficult Not difficult at all     Information is confidential and restricted. Go to Review Flowsheets to unlock data.      02/15/2024    8:30 AM 10/17/2023    8:06 AM 08/18/2023    8:06 AM 07/19/2023   12:11 PM  GAD 7 : Generalized Anxiety Score  Nervous, Anxious, on Edge 2  3 3   Control/stop worrying 1  2 3   Worry too much - different things 2  2 3   Trouble relaxing 2  3 3  Restless 1  2 3   Easily annoyed or irritable 2  3 3   Afraid - awful might happen 1  0 1  Total GAD 7 Score 11  15 19   Anxiety Difficulty Somewhat difficult  Somewhat difficult Somewhat difficult     Information is confidential and restricted. Go to Review Flowsheets to unlock data.       Relevant past medical, surgical, family, and social history reviewed and updated as indicated.  Allergies and medications reviewed and updated. Data reviewed: Chart in Epic.   Past Medical History:  Diagnosis Date   Anxiety    Calcium kidney stones 2007   Depression    Hyperlipidemia    Hypertension    Insomnia    Olecranon fracture    left   Shingles 2006    Past Surgical History:  Procedure Laterality Date   FRACTURE SURGERY     I & D EXTREMITY Left 09/13/2015    Procedure: IRRIGATION AND DEBRIDEMENT EXTREMITY;  Surgeon: Kay Benson Cummins, MD;  Location: [facility] OR;  Service: Orthopedics;  Laterality: Left;   ORIF ELBOW FRACTURE Left 10/30/2015   Procedure: OPEN REDUCTION INTERNAL FIXATION (ORIF) LEFT OLECRANON FRACTURE;  Surgeon: Kay Benson Cummins, MD;  Location: [facility] OR;  Service: Orthopedics;  Laterality: Left;   ORIF HUMERUS FRACTURE Left 09/13/2015   Procedure: OPEN REDUCTION INTERNAL FIXATION (ORIF) ELBOW/OLECRANON AND I AND D;  Surgeon: Kay Benson Cummins, MD;  Location: [facility] OR;  Service: Orthopedics;  Laterality: Left;   TONSILLECTOMY     VASECTOMY      Social History   Socioeconomic History   Marital status: Married    Spouse name: Jacob Benson   Number of children: 2   Years of education: 12   Highest education level: 12th grade  Occupational History   Not on file  Tobacco Use   Smoking status: Former    Current packs/day: 0.00    Types: Cigarettes    Quit date: 05/23/2013    Years since quitting: 10.7   Smokeless tobacco: Current    Types: Snuff  Vaping Use   Vaping status: Never Used  Substance and Sexual Activity   Alcohol use: No    Alcohol/week: 0.0 standard drinks of alcohol   Drug use: No   Sexual activity: Yes    Birth control/protection: Surgical  Other Topics Concern   Not on file  Social History Narrative   Not on file   Social Drivers of Health   Financial Resource Strain: Low Risk  (02/15/2024)   Overall Financial Resource Strain (CARDIA)    Difficulty of Paying Living Expenses: Not very hard  Food Insecurity: No Food Insecurity (02/15/2024)   Hunger Vital Sign    Worried About Running Out of Food in the Last Year: Never true    Ran Out of Food in the Last Year: Never true  Transportation Needs: No Transportation Needs (02/15/2024)   PRAPARE - Administrator, Civil Service (Medical): No    Lack of Transportation (Non-Medical): No  Physical Activity: Sufficiently Active (02/15/2024)   Exercise Vital Sign    Days of Exercise  per Week: 4 days    Minutes of Exercise per Session: 60 min  Stress: Stress Concern Present (02/15/2024)   [employer] of Occupational Health - Occupational Stress Questionnaire    Feeling of Stress: Very much  Social Connections: Moderately Integrated (02/15/2024)   Social Connection and Isolation Panel    Frequency of Communication with Friends and Family: More than three times  a week    Frequency of Social Gatherings with Friends and Family: Once a week    Attends Religious Services: Patient declined    Database administrator or Organizations: Yes    Attends Engineer, structural: More than 4 times per year    Marital Status: Married  Catering manager Violence: Unknown (01/29/2022)   Received from [facility]   HITS    Physically Hurt: Not on file    Insult or Talk Down To: Not on file    Threaten Physical Harm: Not on file    Scream or Curse: Not on file    Outpatient Encounter Medications as of 02/15/2024  Medication Sig   busPIRone  (BUSPAR ) 5 MG tablet Take 1 tablet (5 mg total) by mouth 2 (two) times daily.   QUEtiapine  (SEROQUEL  XR) 50 MG TB24 24 hr tablet Take 2 tablets (100 mg total) by mouth at bedtime.   sertraline  (ZOLOFT ) 25 MG tablet Take 1 tablet (25 mg total) by mouth daily. Take 12.5 mg (1/2 tablet) daily for 7 days then increase to 25 mg (one tablet) daily   zolpidem  (AMBIEN  CR) 12.5 MG CR tablet Take 1 tablet (12.5 mg total) by mouth at bedtime as needed for sleep.   [DISCONTINUED] losartan  (COZAAR ) 50 MG tablet TAKE ONE TABLET BY MOUTH DAILY   [DISCONTINUED] montelukast  (SINGULAIR ) 10 MG tablet TAKE ONE TABLET ONCE DAILY   esomeprazole  (NEXIUM ) 40 MG capsule Take 1 capsule (40 mg total) by mouth daily.   losartan  (COZAAR ) 50 MG tablet Take 1 tablet (50 mg total) by mouth daily.   montelukast  (SINGULAIR ) 10 MG tablet Take 1 tablet (10 mg total) by mouth daily.   [DISCONTINUED] albuterol  (VENTOLIN  HFA) 108 (90 Base) MCG/ACT inhaler Inhale 2 puffs into  the lungs every 6 (six) hours as needed.   [DISCONTINUED] esomeprazole  (NEXIUM ) 40 MG capsule Take 1 capsule (40 mg total) by mouth daily. (NEEDS TO BE SEEN BEFORE NEXT REFILL)   [DISCONTINUED] LORazepam  (ATIVAN ) 0.5 MG tablet Ativan  Taper:  Week 1: Take 0.25 (1/2 tablet) daily as needed for one week.  Week 2:  Take 0.25 mg (1/2 tablet) every other day stop/discontinue   No facility-administered encounter medications on file as of 02/15/2024.    Allergies  Allergen Reactions   Hydrocodone  Other (See Comments)   Guaifenesin & Derivatives Palpitations   Oxycodone  Nausea And Vomiting    Pertinent ROS per HPI, otherwise unremarkable      Objective:  BP 117/74   Pulse 75   Temp (!) 97.4 F (36.3 C)   Ht 5' 10 (1.778 m)   Wt 195 lb 12.8 oz (88.8 kg)   SpO2 100%   BMI 28.09 kg/m    Wt Readings from Last 3 Encounters:  02/15/24 195 lb 12.8 oz (88.8 kg)  08/18/23 175 lb (79.4 kg)  07/19/23 180 lb 3.2 oz (81.7 kg)    Physical Exam Vitals and nursing note reviewed.  Constitutional:      General: He is not in acute distress.    Appearance: Normal appearance. He is well-developed, well-groomed and overweight. He is not ill-appearing, toxic-appearing or diaphoretic.  HENT:     Head: Normocephalic and atraumatic.     Nose: Nose normal.     Mouth/Throat:     Mouth: Mucous membranes are moist.  Eyes:     Conjunctiva/sclera: Conjunctivae normal.     Pupils: Pupils are equal, round, and reactive to light.  Cardiovascular:     Rate and Rhythm:  Normal rate and regular rhythm.     Heart sounds: Normal heart sounds.  Pulmonary:     Effort: Pulmonary effort is normal.     Breath sounds: Normal breath sounds.  Musculoskeletal:     Cervical back: Neck supple.     Right lower leg: No edema.     Left lower leg: No edema.  Skin:    General: Skin is warm and dry.     Capillary Refill: Capillary refill takes less than 2 seconds.  Neurological:     General: No focal deficit present.      Mental Status: He is alert and oriented to person, place, and time.  Psychiatric:        Mood and Affect: Mood normal.        Behavior: Behavior normal. Behavior is cooperative.        Thought Content: Thought content normal.        Judgment: Judgment normal.      Results for orders placed or performed in visit on 09/01/23  Basic Metabolic Panel   Collection Time: 09/01/23  8:14 AM  Result Value Ref Range   Glucose 81 70 - 99 mg/dL   BUN 20 6 - 20 mg/dL   Creatinine, Ser 8.62 (H) 0.76 - 1.27 mg/dL   eGFR 68 >40 fO/fpw/8.26   BUN/Creatinine Ratio 15 9 - 20   Sodium 139 134 - 144 mmol/L   Potassium 4.5 3.5 - 5.2 mmol/L   Chloride 99 96 - 106 mmol/L   CO2 24 20 - 29 mmol/L   Calcium 9.5 8.7 - 10.2 mg/dL       Pertinent labs & imaging results that were available during my care of the patient were reviewed by me and considered in my medical decision making.  Assessment & Plan:  Jenesis was seen today for medical management of chronic issues.  Diagnoses and all orders for this visit:  MDD (major depressive disorder), single episode, moderate (HCC) -     CMP14+EGFR -     CBC with Differential/Platelet -     TSH -     T4, Free  GAD (generalized anxiety disorder) -     CMP14+EGFR -     CBC with Differential/Platelet -     TSH -     T4, Free  Gastroesophageal reflux disease without esophagitis -     esomeprazole  (NEXIUM ) 40 MG capsule; Take 1 capsule (40 mg total) by mouth daily. -     CMP14+EGFR -     CBC with Differential/Platelet  Primary hypertension -     losartan  (COZAAR ) 50 MG tablet; Take 1 tablet (50 mg total) by mouth daily. -     CMP14+EGFR -     CBC with Differential/Platelet -     Lipid panel -     TSH -     T4, Free  Mixed hyperlipidemia -     CMP14+EGFR -     Lipid panel  Seasonal allergic rhinitis due to pollen -     montelukast  (SINGULAIR ) 10 MG tablet; Take 1 tablet (10 mg total) by mouth daily.  Insomnia due to anxiety and fear -      CMP14+EGFR -     CBC with Differential/Platelet -     TSH -     T4, Free      Anxiety disorder Anxiety disorder is being managed with sertraline , extended-release Seroquel , and BuSpar . He reports improvement, though there is potential to increase BuSpar  dosage if anxiety  persists. Sleep has improved with extended-release Seroquel , and he continues to use Ambien . - Continue sertraline  and extended-release Seroquel . - Consider increasing BuSpar  dosage if anxiety persists. - Continue Ambien  for sleep.  Essential hypertension Essential hypertension is being managed with losartan . He reports no symptoms such as headaches, chest pain, leg swelling, or shortness of breath. - Continue losartan .  Impaired renal function, under evaluation Previous elevated creatinine levels were evaluated by a specialist, and no abnormalities were found. Kidney function will be re-evaluated with updated labs. - Order updated labs to assess kidney function.  Gastroesophageal reflux disease Gastroesophageal reflux disease is currently well-controlled with no reported issues.  Obesity, BMI 25-29.9 (overweight) BMI is currently 28, categorized as overweight. He has started going back to the gym and is working on reducing soda intake. Discussed the importance of moderation in dietary habits to prevent overindulgence. - Encourage regular physical activity and gym attendance. - Advise moderation in soda and sugar intake.  Allergic rhinitis Allergic rhinitis is being managed with Singulair  at night. He reports occasional wheezing, likely allergy-related, but no postnasal drip or cough. - Continue Singulair  at night.          Continue all other maintenance medications.  Follow up plan: Return in 6 months (on 08/15/2024), or if symptoms worsen or fail to improve, for Annual Physical.   Continue healthy lifestyle choices, including diet (rich in fruits, vegetables, and lean proteins, and low in salt and simple  carbohydrates) and exercise (at least 30 minutes of moderate physical activity daily).  Educational handout given for DASH diet, HTN  The above assessment and management plan was discussed with the patient. The patient verbalized understanding of and has agreed to the management plan. Patient is aware to call the clinic if they develop any new symptoms or if symptoms persist or worsen. Patient is aware when to return to the clinic for a follow-up visit. Patient educated on when it is appropriate to go to the emergency department.   Rosaline Bruns, FNP-C [facility] [phone]

## 2024-02-15 NOTE — Patient Instructions (Signed)
 Goal BP:  Less than 130/80  Take your medications faithfully as prescribed. Maintain a healthy weight. Get at least 150 minutes of aerobic exercise per week. Minimize salt intake, less than 2000 mg per day. Minimize alcohol intake.  DASH Eating Plan DASH stands for Dietary Approaches to Stop Hypertension. The DASH eating plan is a healthy eating plan that has been shown to reduce high blood pressure (hypertension). Additional health benefits may include reducing the risk of type 2 diabetes mellitus, heart disease, and stroke. The DASH eating plan may also help with weight loss.  WHAT DO I NEED TO KNOW ABOUT THE DASH EATING PLAN? For the DASH eating plan, you will follow these general guidelines: Choose foods with a percent daily value for sodium of less than 5% (as listed on the food label). Use salt-free seasonings or herbs instead of table salt or sea salt. Check with your health care provider or pharmacist before using salt substitutes. Eat lower-sodium products, often labeled as lower sodium or no salt added. Eat fresh foods. Eat more vegetables, fruits, and low-fat dairy products. Choose whole grains. Look for the word whole as the first word in the ingredient list. Choose fish and skinless chicken or [place] more often than red meat. Limit fish, poultry, and meat to 6 oz (170 g) each day. Limit sweets, desserts, sugars, and sugary drinks. Choose heart-healthy fats. Limit cheese to 1 oz (28 g) per day. Eat more home-cooked food and less restaurant, buffet, and fast food. Limit fried foods. Cook foods using methods other than frying. Limit canned vegetables. If you do use them, rinse them well to decrease the sodium. When eating at a restaurant, ask that your food be prepared with less salt, or no salt if possible.  WHAT FOODS CAN I EAT? Seek help from a dietitian for individual calorie needs.  Grains Whole grain or whole wheat bread. Hoshino rice. Whole grain or whole  wheat pasta. Quinoa, bulgur, and whole grain cereals. Low-sodium cereals. Corn or whole wheat flour tortillas. Whole grain cornbread. Whole grain crackers. Low-sodium crackers.  Vegetables Fresh or frozen vegetables (raw, steamed, roasted, or grilled). Low-sodium or reduced-sodium tomato and vegetable juices. Low-sodium or reduced-sodium tomato sauce and paste. Low-sodium or reduced-sodium canned vegetables.   Fruits All fresh, canned (in natural juice), or frozen fruits.  Meat and Other Protein Products Ground beef (85% or leaner), grass-fed beef, or beef trimmed of fat. Skinless chicken or [place]. Ground chicken or [place]. Pork trimmed of fat. All fish and seafood. Eggs. Dried beans, peas, or lentils. Unsalted nuts and seeds. Unsalted canned beans.  Dairy Low-fat dairy products, such as skim or 1% milk, 2% or reduced-fat cheeses, low-fat ricotta or cottage cheese, or plain low-fat yogurt. Low-sodium or reduced-sodium cheeses.  Fats and Oils Tub margarines without trans fats. Light or reduced-fat mayonnaise and salad dressings (reduced sodium). Avocado. Safflower, olive, or canola oils. Natural peanut or almond butter.  Other Unsalted popcorn and pretzels. The items listed above may not be a complete list of recommended foods or beverages. Contact your dietitian for more options.  WHAT FOODS ARE NOT RECOMMENDED?  Grains White bread. White pasta. White rice. Refined cornbread. Bagels and croissants. Crackers that contain trans fat.  Vegetables Creamed or fried vegetables. Vegetables in a cheese sauce. Regular canned vegetables. Regular canned tomato sauce and paste. Regular tomato and vegetable juices.  Fruits Dried fruits. Canned fruit in light or heavy syrup. Fruit juice.  Meat and Other Protein Products Fatty cuts of meat. Ribs,  chicken wings, bacon, sausage, bologna, salami, chitterlings, fatback, hot dogs, bratwurst, and packaged luncheon meats. Salted nuts and seeds. Canned  beans with salt.  Dairy Whole or 2% milk, cream, half-and-half, and cream cheese. Whole-fat or sweetened yogurt. Full-fat cheeses or blue cheese. Nondairy creamers and whipped toppings. Processed cheese, cheese spreads, or cheese curds.  Condiments Onion and garlic salt, seasoned salt, table salt, and sea salt. Canned and packaged gravies. Worcestershire sauce. Tartar sauce. Barbecue sauce. Teriyaki sauce. Soy sauce, including reduced sodium. Steak sauce. Fish sauce. Oyster sauce. Cocktail sauce. Horseradish. Ketchup and mustard. Meat flavorings and tenderizers. Bouillon cubes. Hot sauce. Tabasco sauce. Marinades. Taco seasonings. Relishes.  Fats and Oils Butter, stick margarine, lard, shortening, ghee, and bacon fat. Coconut, palm kernel, or palm oils. Regular salad dressings.  Other Pickles and olives. Salted popcorn and pretzels.  The items listed above may not be a complete list of foods and beverages to avoid. Contact your dietitian for more information.  WHERE CAN I FIND MORE INFORMATION? National Heart, Lung, and Blood Institute: [employer] Document Released: 04/22/2011 Document Revised: 09/17/2013 Document Reviewed: 03/07/2013 [facility] Patient Information 2015 [place], [place]. This information is not intended to replace advice given to you by your health care provider. Make sure you discuss any questions you have with your health care provider.   I think that you would greatly benefit from seeing a nutritionist.  If you are interested, please call Dr. Wonda at [phone] to schedule an appointment.

## 2024-02-16 ENCOUNTER — Ambulatory Visit: Payer: Self-pay | Admitting: Family Medicine

## 2024-02-16 DIAGNOSIS — R748 Abnormal levels of other serum enzymes: Secondary | ICD-10-CM

## 2024-02-16 LAB — CMP14+EGFR
ALT: 84 IU/L — AB (ref 0–44)
AST: 128 IU/L — AB (ref 0–40)
Albumin: 4.4 g/dL (ref 4.1–5.1)
Alkaline Phosphatase: 100 IU/L (ref 47–123)
BUN/Creatinine Ratio: 16 (ref 9–20)
BUN: 18 mg/dL (ref 6–20)
Bilirubin Total: 0.5 mg/dL (ref 0.0–1.2)
CO2: 22 mmol/L (ref 20–29)
Calcium: 9.3 mg/dL (ref 8.7–10.2)
Chloride: 100 mmol/L (ref 96–106)
Creatinine, Ser: 1.15 mg/dL (ref 0.76–1.27)
Globulin, Total: 2.2 g/dL (ref 1.5–4.5)
Glucose: 84 mg/dL (ref 70–99)
Potassium: 4.4 mmol/L (ref 3.5–5.2)
Sodium: 143 mmol/L (ref 134–144)
Total Protein: 6.6 g/dL (ref 6.0–8.5)
eGFR: 84 mL/min/1.73 (ref >59)

## 2024-02-16 LAB — LIPID PANEL
Cholesterol, Total: 225 mg/dL — AB (ref 100–199)
HDL: 41 mg/dL (ref >39)
LDL CALC COMMENT:: 5.5 ratio — AB (ref 0.0–5.0)
LDL Chol Calc (NIH): 147 mg/dL — AB (ref 0–99)
Triglycerides: 202 mg/dL — AB (ref 0–149)
VLDL Cholesterol Cal: 37 mg/dL (ref 5–40)

## 2024-02-16 LAB — CBC WITH DIFFERENTIAL/PLATELET
Basophils Absolute: 0.1 x10E3/uL (ref 0.0–0.2)
Basos: 1 %
EOS (ABSOLUTE): 0.2 x10E3/uL (ref 0.0–0.4)
Eos: 3 %
Hematocrit: 47.2 % (ref 37.5–51.0)
Hemoglobin: 15.4 g/dL (ref 13.0–17.7)
Immature Grans (Abs): 0 x10E3/uL (ref 0.0–0.1)
Immature Granulocytes: 0 %
Lymphocytes Absolute: 1.6 x10E3/uL (ref 0.7–3.1)
Lymphs: 22 %
MCH: 29.7 pg (ref 26.6–33.0)
MCHC: 32.6 g/dL (ref 31.5–35.7)
MCV: 91 fL (ref 79–97)
Monocytes Absolute: 0.5 x10E3/uL (ref 0.1–0.9)
Monocytes: 7 %
Neutrophils Absolute: 4.9 x10E3/uL (ref 1.4–7.0)
Neutrophils: 67 %
Platelets: 276 x10E3/uL (ref 150–450)
RBC: 5.19 x10E6/uL (ref 4.14–5.80)
RDW: 13 % (ref 11.6–15.4)
WBC: 7.2 x10E3/uL (ref 3.4–10.8)

## 2024-02-16 LAB — TSH: TSH: 3.71 u[IU]/mL (ref 0.450–4.500)

## 2024-02-16 LAB — T4, FREE: Free T4: 1.06 ng/dL (ref 0.82–1.77)

## 2024-02-22 ENCOUNTER — Other Ambulatory Visit

## 2024-02-22 DIAGNOSIS — R748 Abnormal levels of other serum enzymes: Secondary | ICD-10-CM

## 2024-02-23 ENCOUNTER — Ambulatory Visit: Payer: Self-pay | Admitting: Family Medicine

## 2024-02-23 LAB — HEPATIC FUNCTION PANEL
ALT: 57 IU/L — ABNORMAL HIGH (ref 0–44)
AST: 36 IU/L (ref 0–40)
Albumin: 4.2 g/dL (ref 4.1–5.1)
Alkaline Phosphatase: 100 IU/L (ref 47–123)
Bilirubin Total: 0.4 mg/dL (ref 0.0–1.2)
Bilirubin, Direct: 0.11 mg/dL (ref 0.00–0.40)
Total Protein: 6.3 g/dL (ref 6.0–8.5)

## 2024-03-14 ENCOUNTER — Telehealth (HOSPITAL_COMMUNITY): Payer: Self-pay | Admitting: *Deleted

## 2024-03-14 ENCOUNTER — Other Ambulatory Visit (HOSPITAL_COMMUNITY): Payer: Self-pay | Admitting: Registered Nurse

## 2024-03-14 DIAGNOSIS — G4700 Insomnia, unspecified: Secondary | ICD-10-CM

## 2024-03-14 MED ORDER — ZOLPIDEM TARTRATE ER 12.5 MG PO TBCR
12.5000 mg | EXTENDED_RELEASE_TABLET | Freq: Every evening | ORAL | 0 refills | Status: DC | PRN
Start: 2024-03-14 — End: 2024-03-22

## 2024-03-14 NOTE — Telephone Encounter (Signed)
 Patient have appointment with provider November 6th and has been out of medication. Medication is Ambien  CR. Pharmacy is [facility].

## 2024-03-22 ENCOUNTER — Encounter (HOSPITAL_COMMUNITY): Payer: Self-pay | Admitting: Registered Nurse

## 2024-03-22 ENCOUNTER — Telehealth (HOSPITAL_COMMUNITY): Admitting: Registered Nurse

## 2024-03-22 DIAGNOSIS — F411 Generalized anxiety disorder: Secondary | ICD-10-CM | POA: Diagnosis not present

## 2024-03-22 DIAGNOSIS — F332 Major depressive disorder, recurrent severe without psychotic features: Secondary | ICD-10-CM | POA: Diagnosis not present

## 2024-03-22 DIAGNOSIS — F410 Panic disorder [episodic paroxysmal anxiety] without agoraphobia: Secondary | ICD-10-CM

## 2024-03-22 DIAGNOSIS — G4700 Insomnia, unspecified: Secondary | ICD-10-CM

## 2024-03-22 MED ORDER — ZOLPIDEM TARTRATE ER 12.5 MG PO TBCR: 12.5000 mg | EXTENDED_RELEASE_TABLET | Freq: Every evening | ORAL | 0 refills | Status: DC | PRN

## 2024-03-22 MED ORDER — BUSPIRONE HCL 7.5 MG PO TABS: 7.5000 mg | ORAL_TABLET | Freq: Two times a day (BID) | ORAL | 1 refills | Status: DC

## 2024-03-22 MED ORDER — QUETIAPINE FUMARATE ER 50 MG PO TB24: 100.0000 mg | ORAL_TABLET | Freq: Every day | ORAL | 1 refills | Status: DC

## 2024-03-22 MED ORDER — SERTRALINE HCL 25 MG PO TABS: 25.0000 mg | ORAL_TABLET | Freq: Every day | ORAL | 1 refills | Status: DC

## 2024-03-22 NOTE — Progress Notes (Signed)
 [facility] MD/PA/NP OP Progress Note  03/22/2024 8:29 AM Jacob Benson  MRN:  [id number]  Virtual Visit via Video Note  I connected with Jacob Benson on 03/22/24 at  8:00 AM EST by a video enabled telemedicine application and verified that I am speaking with the correct person using two identifiers.  Location: Patient: Work Provider: Home office   I discussed the limitations of evaluation and management by telemedicine and the availability of in person appointments. The patient expressed understanding and agreed to proceed.  I discussed the assessment and treatment plan with the patient. The patient was provided an opportunity to ask questions and all were answered. The patient agreed with the plan and demonstrated an understanding of the instructions.   The patient was advised to call back or seek an in-person evaluation if the symptoms worsen or if the condition fails to improve as anticipated.  I provided 20 minutes of non-face-to-face time during this encounter.   Luisa Ruder, NP   Chief Complaint:  Chief Complaint  Patient presents with   Follow-up    Medication management   HPI: Jacob Benson 38 y.o. male presents today for medication management follow up.  He is seen via virtual video visit by this provider, and chart reviewed on 03/22/24.  His psychiatric history is significant for .general anxiety disorder, panic attacks, major depressive disorder, insomnia.  His mental health is currently managed with Seroquel  XR 100 mg daily at bedtime, Ambien  12.5 mg daily at bedtime as needed, Zoloft  25 mg daily and BuSpar  5 mg twice daily.  He reports current medications are effectively managing his mental health without any adverse reactions however reports he ran out of his Ambien  and for 3-4 nights he woke up during the night and had a hard time falling back to sleep.  He also reports that there has been some increase in anxiousness and had talked to his PCP who informed that the  BuSpar  could possibly be increased.  Informed that BuSpar  could be increased and it helped both with depression and anxiety.  Recommendations: Increase BuSpar  7.5 mg twice daily, continue Zoloft  25 mg daily, Seroquel  XR 100 mg daily at bedtime, and Ambien  12.5 mg daily at bedtime as needed. He voiced his understanding/agreement of recommendations discussed today.  Visit Diagnosis:    ICD-10-CM   1. Moderately severe recurrent major depression (HCC)  F33.2 QUEtiapine  (SEROQUEL  XR) 50 MG TB24 24 hr tablet    sertraline  (ZOLOFT ) 25 MG tablet    2. Generalized anxiety disorder with panic attacks  F41.1 busPIRone  (BUSPAR ) 7.5 MG tablet   F41.0 QUEtiapine  (SEROQUEL  XR) 50 MG TB24 24 hr tablet    sertraline  (ZOLOFT ) 25 MG tablet    3. Insomnia, unspecified type  G47.00 busPIRone  (BUSPAR ) 7.5 MG tablet    QUEtiapine  (SEROQUEL  XR) 50 MG TB24 24 hr tablet    zolpidem  (AMBIEN  CR) 12.5 MG CR tablet       Past Psychiatric History: general anxiety disorder, panic attacks, and major depressive disorder   Past Medical History:  Past Medical History:  Diagnosis Date   Anxiety    Calcium kidney stones 2007   Depression    Hyperlipidemia    Hypertension    Insomnia    Olecranon fracture    left   Shingles 2006    Past Surgical History:  Procedure Laterality Date   FRACTURE SURGERY     I & D EXTREMITY Left 09/13/2015   Procedure: IRRIGATION AND DEBRIDEMENT EXTREMITY;  Surgeon:  Kay CHRISTELLA Cummins, MD;  Location: [facility] OR;  Service: Orthopedics;  Laterality: Left;   ORIF ELBOW FRACTURE Left 10/30/2015   Procedure: OPEN REDUCTION INTERNAL FIXATION (ORIF) LEFT OLECRANON FRACTURE;  Surgeon: Kay CHRISTELLA Cummins, MD;  Location: [facility] OR;  Service: Orthopedics;  Laterality: Left;   ORIF HUMERUS FRACTURE Left 09/13/2015   Procedure: OPEN REDUCTION INTERNAL FIXATION (ORIF) ELBOW/OLECRANON AND I AND D;  Surgeon: Kay CHRISTELLA Cummins, MD;  Location: [facility] OR;  Service: Orthopedics;  Laterality: Left;   TONSILLECTOMY     VASECTOMY       Family Psychiatric History: See below and family history  Family History:  Family History  Problem Relation Age of Onset   Anxiety disorder Mother    Hypertension Mother    Anxiety disorder Father    Hypertension Father    Anxiety disorder Sister    Hypertension Sister    Anxiety disorder Maternal Grandfather    Anxiety disorder Maternal Grandmother    Heart disease Maternal Grandmother    CAD Maternal Grandmother        bypass surgery   Anxiety disorder Paternal Grandfather    Anxiety disorder Paternal Grandmother     Social History:  Social History   Socioeconomic History   Marital status: Married    Spouse name: Chanda   Number of children: 2   Years of education: 12   Highest education level: 12th grade  Occupational History   Not on file  Tobacco Use   Smoking status: Former    Current packs/day: 0.00    Types: Cigarettes    Quit date: 05/23/2013    Years since quitting: 10.8   Smokeless tobacco: Current    Types: Snuff  Vaping Use   Vaping status: Never Used  Substance and Sexual Activity   Alcohol use: No    Alcohol/week: 0.0 standard drinks of alcohol   Drug use: No   Sexual activity: Yes    Birth control/protection: Surgical  Other Topics Concern   Not on file  Social History Narrative   Not on file   Social Drivers of Health   Financial Resource Strain: Low Risk  (02/15/2024)   Overall Financial Resource Strain (CARDIA)    Difficulty of Paying Living Expenses: Not very hard  Food Insecurity: No Food Insecurity (02/15/2024)   Hunger Vital Sign    Worried About Running Out of Food in the Last Year: Never true    Ran Out of Food in the Last Year: Never true  Transportation Needs: No Transportation Needs (02/15/2024)   PRAPARE - Administrator, Civil Service (Medical): No    Lack of Transportation (Non-Medical): No  Physical Activity: Sufficiently Active (02/15/2024)   Exercise Vital Sign    Days of Exercise per Week: 4 days     Minutes of Exercise per Session: 60 min  Stress: Stress Concern Present (02/15/2024)   [employer] of Occupational Health - Occupational Stress Questionnaire    Feeling of Stress: Very much  Social Connections: Moderately Integrated (02/15/2024)   Social Connection and Isolation Panel    Frequency of Communication with Friends and Family: More than three times a week    Frequency of Social Gatherings with Friends and Family: Once a week    Attends Religious Services: Patient declined    Database Administrator or Organizations: Yes    Attends Engineer, Structural: More than 4 times per year    Marital Status: Married    Allergies:  Allergies  Allergen Reactions   Hydrocodone  Other (See Comments)   Guaifenesin & Derivatives Palpitations   Oxycodone  Nausea And Vomiting    Metabolic Disorder Labs: Reviewed No results found for: HGBA1C, MPG No results found for: PROLACTIN Lab Results  Component Value Date   CHOL 225 (H) 02/15/2024   TRIG 202 (H) 02/15/2024   HDL 41 02/15/2024   CHOLHDL 5.5 (H) 02/15/2024   LDLCALC 147 (H) 02/15/2024   LDLCALC 128 (H) 07/19/2023   Lab Results  Component Value Date   TSH 3.710 02/15/2024   TSH 2.550 07/19/2023    Current Medications: Current Outpatient Medications  Medication Sig Dispense Refill   busPIRone  (BUSPAR ) 7.5 MG tablet Take 1 tablet (7.5 mg total) by mouth 2 (two) times daily. 182 tablet 1   esomeprazole  (NEXIUM ) 40 MG capsule Take 1 capsule (40 mg total) by mouth daily. 90 capsule 1   losartan  (COZAAR ) 50 MG tablet Take 1 tablet (50 mg total) by mouth daily. 90 tablet 0   montelukast  (SINGULAIR ) 10 MG tablet Take 1 tablet (10 mg total) by mouth daily. 90 tablet 1   QUEtiapine  (SEROQUEL  XR) 50 MG TB24 24 hr tablet Take 2 tablets (100 mg total) by mouth at bedtime. 180 tablet 1   sertraline  (ZOLOFT ) 25 MG tablet Take 1 tablet (25 mg total) by mouth daily. Take 12.5 mg (1/2 tablet) daily for 7 days then increase to  25 mg (one tablet) daily 90 tablet 1   zolpidem  (AMBIEN  CR) 12.5 MG CR tablet Take 1 tablet (12.5 mg total) by mouth at bedtime as needed for sleep. 90 tablet 0   No current facility-administered medications for this visit.    Musculoskeletal: Strength & Muscle Tone: Unable to assess via virtual visit Gait & Station: Unable to assess via virtual visit Patient leans: N/A  Psychiatric Specialty Exam: Review of Systems  Constitutional:        No other complaints voiced at this time  Psychiatric/Behavioral:  Positive for agitation (Iritability), dysphoric mood and sleep disturbance. Negative for hallucinations, self-injury and suicidal ideas. The patient is nervous/anxious.   All other systems reviewed and are negative.   There were no vitals taken for this visit.There is no height or weight on file to calculate BMI.  General Appearance: Casual  Eye Contact:  Good  Speech:  Clear and Coherent and Normal Rate  Volume:  Normal  Mood:  Anxious  Affect:  Congruent  Thought Process:  Coherent, Goal Directed, and Descriptions of Associations: Intact  Orientation:  Full (Time, Place, and Person)  Thought Content: Logical   Suicidal Thoughts:  No  Homicidal Thoughts:  No  Memory:  Immediate;   Good Recent;   Good Remote;   Good  Judgement:  Intact  Insight:  Good  Psychomotor Activity:  Normal  Concentration:  Concentration: Good and Attention Span: Good  Recall:  Good  Fund of Knowledge: Good  Language: Good  Akathisia:  No  Handed:  Right  AIMS (if indicated): not done  Assets:  Communication Skills Desire for Improvement Financial Resources/Insurance Housing Intimacy Physical Health Social Support Transportation  ADL's:  Intact  Cognition: WNL  Sleep:  Good With medication   Screenings: AIMS    Flowsheet Row Video Visit from 03/22/2024 in [facility] [place]  AIMS Total Score 0   AUDIT    Flowsheet Row Office Visit from 02/15/2024  in [facility] Office Visit from 10/17/2023 in [facility]  at [facility]  Alcohol Use Disorder Identification Test Final Score (AUDIT) 4  3   GAD-7    Flowsheet Row Video Visit from 03/22/2024 in [facility] [place] Office Visit from 02/15/2024 in [facility] Office Visit from 10/17/2023 in [facility] [place] Office Visit from 08/18/2023 in [facility] Office Visit from 07/19/2023 in [facility]  Total GAD-7 Score 11 11 20 15 19    PHQ2-9    Flowsheet Row Video Visit from 03/22/2024 in [facility] [place] Office Visit from 02/15/2024 in [facility] Office Visit from 10/17/2023 in [facility] [place] Office Visit from 08/18/2023 in [facility] Office Visit from 07/19/2023 in [facility]  PHQ-2 Total Score 2 1 1 1 2   PHQ-9 Total Score 6 9 13 9 13    Flowsheet Row Video Visit from 03/22/2024 in [facility] [place] Office Visit from 10/17/2023 in [facility] [place]  C-SSRS RISK CATEGORY No Risk No Risk     Assessment and Plan:  Assessment: Summary: Today Jacob Benson reports current medications are effectively managing his mental health without any adverse reactions however there has been some increased anxiousness and difficulty staying asleep when he ran out of Ambien .  Reports eating without any difficulty.  He denies suicidal/self-harm/homicidal ideation, psychosis, paranoia, and abnormal movement.  Medication adjustments discussed and adjustments made. During visit he is dressed appropriate for age and weather.  He is seated in view of  camera with no noted distress.  He is alert/oriented x 4, calm/cooperative and mood is congruent with affect.  He spoke in a clear tone at moderate volume, and normal pace, with good eye contact.  His thought process is coherent, relevant, and there is no indication that he is currently responding to internal/external stimuli or experiencing delusional thought content.    1. Generalized anxiety disorder with panic attacks - busPIRone  (BUSPAR ) 7.5 MG tablet; Take 1 tablet (7.5 mg total) by mouth 2 (two) times daily.  Dispense: 182 tablet; Refill: 1 - QUEtiapine  (SEROQUEL  XR) 50 MG TB24 24 hr tablet; Take 2 tablets (100 mg total) by mouth at bedtime.  Dispense: 180 tablet; Refill: 1 - sertraline  (ZOLOFT ) 25 MG tablet; Take 1 tablet (25 mg total) by mouth daily. Take 12.5 mg (1/2 tablet) daily for 7 days then increase to 25 mg (one tablet) daily  Dispense: 90 tablet; Refill: 1  2. Insomnia, unspecified type - busPIRone  (BUSPAR ) 7.5 MG tablet; Take 1 tablet (7.5 mg total) by mouth 2 (two) times daily.  Dispense: 182 tablet; Refill: 1 - QUEtiapine  (SEROQUEL  XR) 50 MG TB24 24 hr tablet; Take 2 tablets (100 mg total) by mouth at bedtime.  Dispense: 180 tablet; Refill: 1 - zolpidem  (AMBIEN  CR) 12.5 MG CR tablet; Take 1 tablet (12.5 mg total) by mouth at bedtime as needed for sleep.  Dispense: 90 tablet; Refill: 0  3. Moderately severe recurrent major depression (HCC) (Primary) - QUEtiapine  (SEROQUEL  XR) 50 MG TB24 24 hr tablet; Take 2 tablets (100 mg total) by mouth at bedtime.  Dispense: 180 tablet; Refill: 1 - sertraline  (ZOLOFT ) 25 MG tablet; Take 1 tablet (25 mg total) by mouth daily. Take 12.5 mg (1/2 tablet) daily for 7 days then increase to 25 mg (one tablet) daily  Dispense: 90 tablet; Refill: 1    Plan: Medications: Meds ordered this encounter  Medications   busPIRone  (BUSPAR ) 7.5 MG tablet    Sig: Take 1 tablet (7.5 mg total) by mouth 2 (two) times daily.    Dispense:  182 tablet     Refill:  1    Supervising Provider:   CURRY PATERSON T [[id number]]   QUEtiapine  (SEROQUEL  XR) 50 MG TB24 24 hr tablet    Sig: Take 2 tablets (100 mg total) by mouth at bedtime.    Dispense:  180 tablet    Refill:  1    Supervising Provider:   ARFEEN, SYED T [[id number]]   sertraline  (ZOLOFT ) 25 MG tablet    Sig: Take 1 tablet (25 mg total) by mouth daily. Take 12.5 mg (1/2 tablet) daily for 7 days then increase to 25 mg (one tablet) daily    Dispense:  90 tablet    Refill:  1    Supervising Provider:   ARFEEN, SYED T [[id number]]   zolpidem  (AMBIEN  CR) 12.5 MG CR tablet    Sig: Take 1 tablet (12.5 mg total) by mouth at bedtime as needed for sleep.    Dispense:  90 tablet    Refill:  0    Supervising Provider:   CURRY PATERSON T [[id number]]    Labs:  Not indicated at this time Lab Orders  No laboratory test(s) ordered today    Other:  Counseling/therapy: Continue services with Winton Rubinstein.     Jacob Benson is instructed to call [phone], mobile crisis, or present to the nearest emergency room should he experience any suicidal/homicidal ideation, auditory/visual/hallucinations, or detrimental worsening of his mental health condition.   Jacob Benson participated in the development of this treatment plan and verbalized his understanding/agreement with plan as listed.  Follow Up: Return in 3 months for medication management Call in the interim for any side-effects, decompensation, questions, or problems  Collaboration of Care: Collaboration of Care: Medication Management AEB medication assessment, adjustment, and refills  Patient/Guardian was advised Release of Information must be obtained prior to any record release in order to collaborate their care with an outside provider. Patient/Guardian was advised if they have not already done so to contact the registration department to sign all necessary forms in order for us  to release information regarding their care.   Consent: Patient/Guardian gives verbal  consent for treatment and assignment of benefits for services provided during this visit. Patient/Guardian expressed understanding and agreed to proceed.    Tapanga Ottaway, NP 03/22/2024, 8:29 AM

## 2024-03-22 NOTE — Patient Instructions (Signed)

## 2024-06-21 ENCOUNTER — Telehealth (HOSPITAL_COMMUNITY): Admitting: Registered Nurse

## 2024-06-21 ENCOUNTER — Encounter (HOSPITAL_COMMUNITY): Payer: Self-pay | Admitting: Registered Nurse

## 2024-06-21 DIAGNOSIS — F332 Major depressive disorder, recurrent severe without psychotic features: Secondary | ICD-10-CM

## 2024-06-21 DIAGNOSIS — F410 Panic disorder [episodic paroxysmal anxiety] without agoraphobia: Secondary | ICD-10-CM

## 2024-06-21 DIAGNOSIS — G4700 Insomnia, unspecified: Secondary | ICD-10-CM

## 2024-06-21 MED ORDER — QUETIAPINE FUMARATE ER 50 MG PO TB24: 100.0000 mg | ORAL_TABLET | Freq: Every day | ORAL | 1 refills | Status: AC

## 2024-06-21 MED ORDER — SERTRALINE HCL 25 MG PO TABS: 25.0000 mg | ORAL_TABLET | Freq: Every day | ORAL | 1 refills | Status: AC

## 2024-06-21 MED ORDER — BUSPIRONE HCL 7.5 MG PO TABS: 7.5000 mg | ORAL_TABLET | Freq: Two times a day (BID) | ORAL | 1 refills | Status: AC

## 2024-06-21 MED ORDER — ZOLPIDEM TARTRATE ER 12.5 MG PO TBCR: 12.5000 mg | EXTENDED_RELEASE_TABLET | Freq: Every evening | ORAL | 2 refills | Status: AC | PRN

## 2024-06-21 NOTE — Progress Notes (Signed)
 [facility] MD/PA/NP OP Progress Note  06/21/2024 11:25 AM Jacob Benson  MRN:  [id number]  Virtual Visit via Video Note  I connected with Jacob Benson on 06/21/24 at 11:00 AM EST by a video enabled telemedicine application and verified that I am speaking with the correct person using two identifiers.  Location: Patient: Work Provider: Home office   I discussed the limitations of evaluation and management by telemedicine and the availability of in person appointments. The patient expressed understanding and agreed to proceed.  I discussed the assessment and treatment plan with the patient. The patient was provided an opportunity to ask questions and all were answered. The patient agreed with the plan and demonstrated an understanding of the instructions.   The patient was advised to call back or seek an in-person evaluation if the symptoms worsen or if the condition fails to improve as anticipated.  I provided 20 minutes of non-face-to-face time during this encounter.  I personally spent a total of 20 minutes in the care of the patient today including preparing to see the patient, getting/reviewing separately obtained history, performing a medically appropriate exam/evaluation, counseling and educating, placing orders, and documenting clinical information in the EHR; In addiction to conducting screenings AIMS, Nutrition, and Pain, discussing medication options, medication education, and discussing safety.  Luisa Ruder, NP   Chief Complaint:  Chief Complaint  Patient presents with   Follow-up    Medication management   HPI: Jacob Benson 39 y.o. male presents today for medication management follow up.  He is seen via virtual video visit by this provider, and chart reviewed on 06/21/24.  His psychiatric history is significant for .general anxiety disorder, panic attacks, major depressive disorder, insomnia.  His mental health is currently managed with Seroquel  XR 200 mg daily at  bedtime, Ambien  12.5 mg daily at bedtime as needed, Zoloft  25 mg daily, and BuSpar  7.5 mg twice daily.  He reports current medications are effectively managing his mental health without any adverse reaction.  He reports he is eating and sleeping without difficulty.  He does report there are situational episodes of depression and anxiety but he is able to bounce back and not linger in either.  He denies suicidal/self-harm/homicidal ideation, psychosis, paranoia, and abnormal movement.  Screenings completed during today's visit C-SSRS, AIMS, Nutrition, and Pain, see scores below.  Medication/treatment options discussed: Medication assessment completed and no adjustments needed at this time  Recommendations: Continue Zoloft  25 mg daily, BuSpar  7.5 mg twice daily, Seroquel  XR 200 mg daily at bedtime, and Ambien  12.5 mg daily at bedtime as needed He voiced understanding/agreement with information/recommendation discussed during today's visit  Visit Diagnosis:    ICD-10-CM   1. Moderately severe recurrent major depression (HCC)  F33.2 sertraline  (ZOLOFT ) 25 MG tablet    QUEtiapine  (SEROQUEL  XR) 50 MG TB24 24 hr tablet    busPIRone  (BUSPAR ) 7.5 MG tablet    2. Generalized anxiety disorder with panic attacks  F41.1 sertraline  (ZOLOFT ) 25 MG tablet   F41.0 QUEtiapine  (SEROQUEL  XR) 50 MG TB24 24 hr tablet    busPIRone  (BUSPAR ) 7.5 MG tablet    3. Insomnia, unspecified type  G47.00 QUEtiapine  (SEROQUEL  XR) 50 MG TB24 24 hr tablet    zolpidem  (AMBIEN  CR) 12.5 MG CR tablet      Past Psychiatric History: general anxiety disorder, panic attacks, and major depressive disorder   Past Medical History:  Past Medical History:  Diagnosis Date   Anxiety    Calcium kidney stones 2007  Depression    Hyperlipidemia    Hypertension    Insomnia    Olecranon fracture    left   Shingles 2006    Past Surgical History:  Procedure Laterality Date   FRACTURE SURGERY     I & D EXTREMITY Left 09/13/2015    Procedure: IRRIGATION AND DEBRIDEMENT EXTREMITY;  Surgeon: Kay CHRISTELLA Cummins, MD;  Location: [facility] OR;  Service: Orthopedics;  Laterality: Left;   ORIF ELBOW FRACTURE Left 10/30/2015   Procedure: OPEN REDUCTION INTERNAL FIXATION (ORIF) LEFT OLECRANON FRACTURE;  Surgeon: Kay CHRISTELLA Cummins, MD;  Location: [facility] OR;  Service: Orthopedics;  Laterality: Left;   ORIF HUMERUS FRACTURE Left 09/13/2015   Procedure: OPEN REDUCTION INTERNAL FIXATION (ORIF) ELBOW/OLECRANON AND I AND D;  Surgeon: Kay CHRISTELLA Cummins, MD;  Location: [facility] OR;  Service: Orthopedics;  Laterality: Left;   TONSILLECTOMY     VASECTOMY      Family Psychiatric History: See below and family history  Family History:  Family History  Problem Relation Age of Onset   Anxiety disorder Mother    Hypertension Mother    Anxiety disorder Father    Hypertension Father    Anxiety disorder Sister    Hypertension Sister    Anxiety disorder Maternal Grandfather    Anxiety disorder Maternal Grandmother    Heart disease Maternal Grandmother    CAD Maternal Grandmother        bypass surgery   Anxiety disorder Paternal Grandfather    Anxiety disorder Paternal Grandmother     Social History:  Social History   Socioeconomic History   Marital status: Married    Spouse name: Chanda   Number of children: 2   Years of education: 12   Highest education level: 12th grade  Occupational History   Not on file  Tobacco Use   Smoking status: Former    Current packs/day: 0.00    Average packs/day: 0.5 packs/day    Types: Cigarettes    Quit date: 05/23/2013    Years since quitting: 11.0   Smokeless tobacco: Current    Types: Snuff  Vaping Use   Vaping status: Never Used  Substance and Sexual Activity   Alcohol use: No    Alcohol/week: 0.0 standard drinks of alcohol   Drug use: No   Sexual activity: Yes    Birth control/protection: Surgical  Other Topics Concern   Not on file  Social History Narrative   Not on file   Social Drivers of Health   Tobacco  Use: High Risk (06/21/2024)   Patient History    Smoking Tobacco Use: Former    Smokeless Tobacco Use: Current    Passive Exposure: Not on Actuary Strain: Low Risk (02/15/2024)   Overall Financial Resource Strain (CARDIA)    Difficulty of Paying Living Expenses: Not very hard  Food Insecurity: No Food Insecurity (02/15/2024)   Epic    Worried About Programme Researcher, Broadcasting/film/video in the Last Year: Never true    Ran Out of Food in the Last Year: Never true  Transportation Needs: No Transportation Needs (02/15/2024)   Epic    Lack of Transportation (Medical): No    Lack of Transportation (Non-Medical): No  Physical Activity: Sufficiently Active (02/15/2024)   Exercise Vital Sign    Days of Exercise per Week: 4 days    Minutes of Exercise per Session: 60 min  Stress: Stress Concern Present (02/15/2024)   [employer] of Occupational Health - Occupational Stress Questionnaire  Feeling of Stress: Very much  Social Connections: Moderately Integrated (02/15/2024)   Social Connection and Isolation Panel    Frequency of Communication with Friends and Family: More than three times a week    Frequency of Social Gatherings with Friends and Family: Once a week    Attends Religious Services: Patient declined    Active Member of Clubs or Organizations: Yes    Attends Banker Meetings: More than 4 times per year    Marital Status: Married  Depression (PHQ2-9): Medium Risk (03/22/2024)   Depression (PHQ2-9)    PHQ-2 Score: 6  Alcohol Screen: Low Risk (02/15/2024)   Alcohol Screen    Last Alcohol Screening Score (AUDIT): 4  Housing: Low Risk (02/15/2024)   Epic    Unable to Pay for Housing in the Last Year: No    Number of Times Moved in the Last Year: 0    Homeless in the Last Year: No  Utilities: Not on file  Health Literacy: Not on file    Allergies:  Allergies  Allergen Reactions   Hydrocodone  Other (See Comments)   Guaifenesin & Derivatives Palpitations   Oxycodone   Nausea And Vomiting    Metabolic Disorder Labs: Reviewed No results found for: HGBA1C, MPG No results found for: PROLACTIN Lab Results  Component Value Date   CHOL 225 (H) 02/15/2024   TRIG 202 (H) 02/15/2024   HDL 41 02/15/2024   CHOLHDL 5.5 (H) 02/15/2024   LDLCALC 147 (H) 02/15/2024   LDLCALC 128 (H) 07/19/2023   Lab Results  Component Value Date   TSH 3.710 02/15/2024   TSH 2.550 07/19/2023    Current Medications: Current Outpatient Medications  Medication Sig Dispense Refill   busPIRone  (BUSPAR ) 7.5 MG tablet Take 1 tablet (7.5 mg total) by mouth 2 (two) times daily. 180 tablet 1   esomeprazole  (NEXIUM ) 40 MG capsule Take 1 capsule (40 mg total) by mouth daily. 90 capsule 1   losartan  (COZAAR ) 50 MG tablet Take 1 tablet (50 mg total) by mouth daily. 90 tablet 0   montelukast  (SINGULAIR ) 10 MG tablet Take 1 tablet (10 mg total) by mouth daily. 90 tablet 1   QUEtiapine  (SEROQUEL  XR) 50 MG TB24 24 hr tablet Take 2 tablets (100 mg total) by mouth at bedtime. 180 tablet 1   sertraline  (ZOLOFT ) 25 MG tablet Take 1 tablet (25 mg total) by mouth daily. Take 12.5 mg (1/2 tablet) daily for 7 days then increase to 25 mg (one tablet) daily 90 tablet 1   zolpidem  (AMBIEN  CR) 12.5 MG CR tablet Take 1 tablet (12.5 mg total) by mouth at bedtime as needed for sleep. 30 tablet 2   No current facility-administered medications for this visit.    Musculoskeletal: Strength & Muscle Tone: Unable to assess via virtual visit Gait & Station: Unable to assess via virtual visit Patient leans: N/A  Psychiatric Specialty Exam: Review of Systems  Constitutional:        No other complaints voiced at this time  Psychiatric/Behavioral:  Negative for hallucinations, self-injury and suicidal ideas. Agitation: Reported stable mood. Dysphoric mood: Reported stable. Sleep disturbance: Reports stable.Nervous/anxious: Reported stable.   All other systems reviewed and are negative.   There were no  vitals taken for this visit.There is no height or weight on file to calculate BMI.  General Appearance: Casual  Eye Contact:  Good  Speech:  Clear and Coherent and Normal Rate  Volume:  Normal  Mood:  Euthymic  Affect:  Congruent  Thought Process:  Coherent, Goal Directed, and Descriptions of Associations: Intact  Orientation:  Full (Time, Place, and Person)  Thought Content: Logical   Suicidal Thoughts:  No  Homicidal Thoughts:  No  Memory:  Immediate;   Good Recent;   Good Remote;   Good  Judgement:  Intact  Insight:  Good  Psychomotor Activity:  Normal  Concentration:  Concentration: Good and Attention Span: Good  Recall:  Good  Fund of Knowledge: Good  Language: Good  Akathisia:  No  Handed:  Right  AIMS (if indicated): not done  Assets:  Communication Skills Desire for Improvement Financial Resources/Insurance Housing Intimacy Physical Health Social Support Transportation  ADL's:  Intact  Cognition: WNL  Sleep:  Good With medication   Screenings: AIMS    Flowsheet Row Video Visit from 06/21/2024 in [facility] [place] Video Visit from 03/22/2024 in [facility] [place]  AIMS Total Score 0 0   AUDIT    Flowsheet Row Office Visit from 02/15/2024 in [facility] Office Visit from 10/17/2023 in [facility] [place]  Alcohol Use Disorder Identification Test Final Score (AUDIT) 4  3   GAD-7    Flowsheet Row Video Visit from 03/22/2024 in [facility] [place] Office Visit from 02/15/2024 in [facility] Office Visit from 10/17/2023 in [facility] [place] Office Visit from 08/18/2023 in [facility] Office Visit from 07/19/2023 in [facility]  Total GAD-7 Score 11 11 20 15 19     PHQ2-9    Flowsheet Row Video Visit from 03/22/2024 in [facility] [place] Office Visit from 02/15/2024 in [facility] Office Visit from 10/17/2023 in [facility] [place] Office Visit from 08/18/2023 in [facility] Office Visit from 07/19/2023 in [facility]  PHQ-2 Total Score 2 1 1 1 2   PHQ-9 Total Score 6 9 13 9 13    Flowsheet Row Video Visit from 06/21/2024 in [facility] [place] Video Visit from 03/22/2024 in [facility] [place] Office Visit from 10/17/2023 in [facility] [place]  C-SSRS RISK CATEGORY No Risk No Risk No Risk   Assessment and Plan:  Assessment:   MARKEITH JUE reported current medication regimen is effectively managing mental health without adverse reaction.  Reported eating/sleeping without difficulty.  He denied suicidal/self-harm/homicidal ideation, psychosis, paranoia, and abnormal movement.  Medication assessment completed no adjustments needed.  He was dressed appropriately for age and current weather.  He was seated comfortably in view of camera with no noted distress.  He was alert, oriented x 4, calm, cooperative, and attentive.  His mood was congruent with affect.  He had normal speech and behavior.  Objectively there was no evidence of psychosis, mania, or delusional thinking.  He  was able to converse coherently and responded appropriately with goal directed thoughts, no distractibility, or pre-occupation.  1. Generalized anxiety disorder with panic attacks - sertraline  (ZOLOFT ) 25 MG tablet; Take 1 tablet (25 mg total) by mouth daily. Take 12.5 mg (1/2 tablet) daily for 7 days then increase to 25 mg (one tablet) daily  Dispense: 90 tablet; Refill: 1 - QUEtiapine  (SEROQUEL  XR) 50 MG TB24 24 hr tablet;  Take  2 tablets (100 mg total) by mouth at bedtime.  Dispense: 180 tablet; Refill: 1 - busPIRone  (BUSPAR ) 7.5 MG tablet; Take 1 tablet (7.5 mg total) by mouth 2 (two) times daily.  Dispense: 180 tablet; Refill: 1  2. Moderately severe recurrent major depression (HCC) (Primary) - sertraline  (ZOLOFT ) 25 MG tablet; Take 1 tablet (25 mg total) by mouth daily. Take 12.5 mg (1/2 tablet) daily for 7 days then increase to 25 mg (one tablet) daily  Dispense: 90 tablet; Refill: 1 - QUEtiapine  (SEROQUEL  XR) 50 MG TB24 24 hr tablet; Take 2 tablets (100 mg total) by mouth at bedtime.  Dispense: 180 tablet; Refill: 1 - busPIRone  (BUSPAR ) 7.5 MG tablet; Take 1 tablet (7.5 mg total) by mouth 2 (two) times daily.  Dispense: 180 tablet; Refill: 1  3. Insomnia, unspecified type - QUEtiapine  (SEROQUEL  XR) 50 MG TB24 24 hr tablet; Take 2 tablets (100 mg total) by mouth at bedtime.  Dispense: 180 tablet; Refill: 1 - zolpidem  (AMBIEN  CR) 12.5 MG CR tablet; Take 1 tablet (12.5 mg total) by mouth at bedtime as needed for sleep.  Dispense: 30 tablet; Refill: 2   Plan:   Medication management: Meds ordered this encounter  Medications   sertraline  (ZOLOFT ) 25 MG tablet    Sig: Take 1 tablet (25 mg total) by mouth daily. Take 12.5 mg (1/2 tablet) daily for 7 days then increase to 25 mg (one tablet) daily    Dispense:  90 tablet    Refill:  1    Supervising Provider:   ARFEEN, SYED T [[id number]]   QUEtiapine  (SEROQUEL  XR) 50 MG TB24 24 hr tablet    Sig: Take 2 tablets (100 mg total) by mouth at bedtime.    Dispense:  180 tablet    Refill:  1    Supervising Provider:   CURRY, SYED T [[id number]]   busPIRone  (BUSPAR ) 7.5 MG tablet    Sig: Take 1 tablet (7.5 mg total) by mouth 2 (two) times daily.    Dispense:  180 tablet    Refill:  1    Supervising Provider:   CURRY PATERSON T [[id number]]   zolpidem  (AMBIEN  CR) 12.5 MG CR tablet    Sig: Take 1 tablet (12.5 mg total) by mouth at bedtime as needed for sleep.    Dispense:  30 tablet     Refill:  2    Supervising Provider:   CURRY, SYED T [[id number]]   No orders of the defined types were placed in this encounter.   Labs:  Most recent labs reviewed.  Lab orders not indicated at this time.    Counseling/Therapy: Therapy sessions completed and not interested in restarting at this time.   Safety:  He was instructed to call 911, [phone], mobile crisis, or present to the nearest emergency room should he experiences any suicidal/homicidal ideation, auditory/visual/hallucinations, or detrimental worsening of his mental health condition.    Jacob Benson participated in the development of this treatment plan and verbalized his understanding/agreement.    Follow Up: Return in 3 months for medication management.   Call in the interim for any side-effects, decompensation, questions, or problems   Collaboration of Care: Collaboration of Care: Medication Management AEB medication assessment and refills  Patient/Guardian was advised Release of Information must be obtained prior to any record release in order to collaborate their care with an outside provider. Patient/Guardian was advised if they have not already done so to contact the registration department to sign all necessary forms in  order for us  to release information regarding their care.   Consent: Patient/Guardian gives verbal consent for treatment and assignment of benefits for services provided during this visit. Patient/Guardian expressed understanding and agreed to proceed.    Meghann Landing, NP 06/21/2024, 11:25 AM

## 2024-06-21 NOTE — Patient Instructions (Signed)
 If no one has contacted, you by the end of business day today please call the appropriate office listed below to schedule your next visit for medication management with Luisa Ruder, NP:    [facility] [address] [place], [place], [place], [place] [address]  Phone: [phone] (Call to schedule appointment)  [facility] [address] [place], [place] [address] Phone: [phone] (Call to schedule appointment)   If you experience suicidal or homicidal thoughts, hallucinations, or a severe decline in your mental health, seek immediate help. You can: Call 911 Call [address] [facility] Suicide Prevention Lifeline) [phone]. This is a hotline for Spanish speakers. Use mobile crisis services Go to the nearest emergency room Veterans can also: Call 988 and press 1 Text [phone] Mental health includes understanding and managing your emotions and behaviors in healthy ways. If you notice signs of emotional or mental distress, reach out to trusted supports--family, friends, healthcare providers, or mental health professionals. Healthy mental habits include stress-management skills, calming strategies, regular exercise, good sleep, healthy eating, and maintaining supportive relationships. This information does not replace advice from your healthcare provider--discuss any questions or concerns directly with them.  Mobile Crisis Response Teams Listed by counties in vicinity of [facility] providers [facility] [phone] [facility] [phone] [facility] [phone] [facility] [phone] [place]                * [facility] [phone]                * [employer] [phone]  [facility]  [phone] [facility] [employer]  [phone] * [employer] [phone]   Call 988 (National Suicide Prevention Lifeline)
